# Patient Record
Sex: Male | Born: 1935 | Race: White | Hispanic: No | Marital: Married | Smoking: Never smoker
Health system: Southern US, Community
[De-identification: ages and names within clinical notes are randomized; demographics above are authoritative.]

## PROBLEM LIST (undated history)

## (undated) DIAGNOSIS — I2510 Atherosclerotic heart disease of native coronary artery without angina pectoris: Secondary | ICD-10-CM

## (undated) DIAGNOSIS — E119 Type 2 diabetes mellitus without complications: Secondary | ICD-10-CM

## (undated) DIAGNOSIS — I499 Cardiac arrhythmia, unspecified: Secondary | ICD-10-CM

## (undated) DIAGNOSIS — F0390 Unspecified dementia without behavioral disturbance: Secondary | ICD-10-CM

## (undated) DIAGNOSIS — K219 Gastro-esophageal reflux disease without esophagitis: Secondary | ICD-10-CM

## (undated) DIAGNOSIS — E785 Hyperlipidemia, unspecified: Secondary | ICD-10-CM

## (undated) DIAGNOSIS — C679 Malignant neoplasm of bladder, unspecified: Secondary | ICD-10-CM

## (undated) DIAGNOSIS — R310 Gross hematuria: Secondary | ICD-10-CM

## (undated) DIAGNOSIS — I10 Essential (primary) hypertension: Secondary | ICD-10-CM

## (undated) DIAGNOSIS — C259 Malignant neoplasm of pancreas, unspecified: Secondary | ICD-10-CM

## (undated) DIAGNOSIS — N189 Chronic kidney disease, unspecified: Secondary | ICD-10-CM

## (undated) HISTORY — PX: BLADDER SURGERY: SHX569

## (undated) HISTORY — DX: Malignant neoplasm of bladder, unspecified: C67.9

## (undated) HISTORY — DX: Malignant neoplasm of pancreas, unspecified: C25.9

## (undated) HISTORY — DX: Essential (primary) hypertension: I10

## (undated) HISTORY — PX: HERNIA REPAIR: SHX51

## (undated) HISTORY — DX: Gross hematuria: R31.0

## (undated) HISTORY — PX: WHIPPLE PROCEDURE: SHX2667

## (undated) HISTORY — DX: Type 2 diabetes mellitus without complications: E11.9

---

## 2013-03-09 HISTORY — PX: PORTACATH PLACEMENT: SHX2246

## 2013-06-21 ENCOUNTER — Ambulatory Visit: Payer: Self-pay | Admitting: Obstetrics and Gynecology

## 2013-07-07 DIAGNOSIS — D696 Thrombocytopenia, unspecified: Secondary | ICD-10-CM | POA: Insufficient documentation

## 2013-08-02 ENCOUNTER — Ambulatory Visit: Payer: Self-pay | Admitting: Urology

## 2013-08-02 DIAGNOSIS — I10 Essential (primary) hypertension: Secondary | ICD-10-CM

## 2013-08-02 DIAGNOSIS — E119 Type 2 diabetes mellitus without complications: Secondary | ICD-10-CM

## 2013-08-02 LAB — CBC WITH DIFFERENTIAL/PLATELET
Basophil #: 0.1 10*3/uL (ref 0.0–0.1)
Basophil %: 1.1 %
EOS ABS: 0.1 10*3/uL (ref 0.0–0.7)
EOS PCT: 1.2 %
HCT: 39.6 % — AB (ref 40.0–52.0)
HGB: 13 g/dL (ref 13.0–18.0)
Lymphocyte #: 1.2 10*3/uL (ref 1.0–3.6)
Lymphocyte %: 20 %
MCH: 30.9 pg (ref 26.0–34.0)
MCHC: 32.8 g/dL (ref 32.0–36.0)
MCV: 94 fL (ref 80–100)
Monocyte #: 0.6 x10 3/mm (ref 0.2–1.0)
Monocyte %: 9.7 %
Neutrophil #: 4.3 10*3/uL (ref 1.4–6.5)
Neutrophil %: 68 %
PLATELETS: 114 10*3/uL — AB (ref 150–440)
RBC: 4.21 10*6/uL — ABNORMAL LOW (ref 4.40–5.90)
RDW: 14.2 % (ref 11.5–14.5)
WBC: 6.2 10*3/uL (ref 3.8–10.6)

## 2013-08-02 LAB — BASIC METABOLIC PANEL
Anion Gap: 7 (ref 7–16)
BUN: 40 mg/dL — AB (ref 7–18)
CHLORIDE: 107 mmol/L (ref 98–107)
Calcium, Total: 9.3 mg/dL (ref 8.5–10.1)
Co2: 24 mmol/L (ref 21–32)
Creatinine: 1.47 mg/dL — ABNORMAL HIGH (ref 0.60–1.30)
EGFR (African American): 50 — ABNORMAL LOW
EGFR (Non-African Amer.): 43 — ABNORMAL LOW
Glucose: 175 mg/dL — ABNORMAL HIGH (ref 65–99)
Osmolality: 290 (ref 275–301)
Potassium: 4.1 mmol/L (ref 3.5–5.1)
Sodium: 138 mmol/L (ref 136–145)

## 2013-08-03 DIAGNOSIS — E785 Hyperlipidemia, unspecified: Secondary | ICD-10-CM | POA: Insufficient documentation

## 2013-08-03 DIAGNOSIS — R9431 Abnormal electrocardiogram [ECG] [EKG]: Secondary | ICD-10-CM | POA: Insufficient documentation

## 2013-08-03 DIAGNOSIS — I6529 Occlusion and stenosis of unspecified carotid artery: Secondary | ICD-10-CM | POA: Insufficient documentation

## 2013-08-03 DIAGNOSIS — I2510 Atherosclerotic heart disease of native coronary artery without angina pectoris: Secondary | ICD-10-CM | POA: Insufficient documentation

## 2013-08-03 DIAGNOSIS — M810 Age-related osteoporosis without current pathological fracture: Secondary | ICD-10-CM | POA: Insufficient documentation

## 2013-08-03 DIAGNOSIS — I779 Disorder of arteries and arterioles, unspecified: Secondary | ICD-10-CM | POA: Insufficient documentation

## 2013-08-03 DIAGNOSIS — R413 Other amnesia: Secondary | ICD-10-CM | POA: Insufficient documentation

## 2013-08-03 DIAGNOSIS — I739 Peripheral vascular disease, unspecified: Secondary | ICD-10-CM

## 2013-08-03 DIAGNOSIS — I493 Ventricular premature depolarization: Secondary | ICD-10-CM | POA: Insufficient documentation

## 2013-08-14 ENCOUNTER — Ambulatory Visit: Payer: Self-pay | Admitting: Urology

## 2013-08-15 LAB — PATHOLOGY REPORT

## 2013-08-16 ENCOUNTER — Emergency Department: Payer: Self-pay | Admitting: Emergency Medicine

## 2013-08-16 LAB — COMPREHENSIVE METABOLIC PANEL
ALBUMIN: 3.8 g/dL (ref 3.4–5.0)
AST: 24 U/L (ref 15–37)
Alkaline Phosphatase: 117 U/L
Anion Gap: 8 (ref 7–16)
BUN: 30 mg/dL — ABNORMAL HIGH (ref 7–18)
Bilirubin,Total: 0.8 mg/dL (ref 0.2–1.0)
CHLORIDE: 96 mmol/L — AB (ref 98–107)
Calcium, Total: 9 mg/dL (ref 8.5–10.1)
Co2: 23 mmol/L (ref 21–32)
Creatinine: 1.66 mg/dL — ABNORMAL HIGH (ref 0.60–1.30)
EGFR (Non-African Amer.): 37 — ABNORMAL LOW
GFR CALC AF AMER: 43 — AB
GLUCOSE: 230 mg/dL — AB (ref 65–99)
Osmolality: 269 (ref 275–301)
POTASSIUM: 3.9 mmol/L (ref 3.5–5.1)
SGPT (ALT): 24 U/L (ref 12–78)
Sodium: 127 mmol/L — ABNORMAL LOW (ref 136–145)
TOTAL PROTEIN: 6.8 g/dL (ref 6.4–8.2)

## 2013-08-16 LAB — CBC
HCT: 39.7 % — ABNORMAL LOW (ref 40.0–52.0)
HGB: 12.8 g/dL — ABNORMAL LOW (ref 13.0–18.0)
MCH: 30.2 pg (ref 26.0–34.0)
MCHC: 32.2 g/dL (ref 32.0–36.0)
MCV: 94 fL (ref 80–100)
Platelet: 132 10*3/uL — ABNORMAL LOW (ref 150–440)
RBC: 4.23 10*6/uL — ABNORMAL LOW (ref 4.40–5.90)
RDW: 14.3 % (ref 11.5–14.5)
WBC: 11.3 10*3/uL — ABNORMAL HIGH (ref 3.8–10.6)

## 2013-08-22 DIAGNOSIS — Z466 Encounter for fitting and adjustment of urinary device: Secondary | ICD-10-CM | POA: Insufficient documentation

## 2013-09-01 ENCOUNTER — Ambulatory Visit: Payer: Self-pay | Admitting: Hematology and Oncology

## 2013-09-06 ENCOUNTER — Ambulatory Visit: Payer: Self-pay | Admitting: Hematology and Oncology

## 2013-09-19 ENCOUNTER — Ambulatory Visit: Payer: Self-pay | Admitting: Hematology and Oncology

## 2013-09-19 LAB — COMPREHENSIVE METABOLIC PANEL
ALT: 32 U/L (ref 12–78)
ANION GAP: 10 (ref 7–16)
AST: 22 U/L (ref 15–37)
Albumin: 3.3 g/dL — ABNORMAL LOW (ref 3.4–5.0)
Alkaline Phosphatase: 116 U/L
BILIRUBIN TOTAL: 0.5 mg/dL (ref 0.2–1.0)
BUN: 32 mg/dL — ABNORMAL HIGH (ref 7–18)
CREATININE: 1.61 mg/dL — AB (ref 0.60–1.30)
Calcium, Total: 8.8 mg/dL (ref 8.5–10.1)
Chloride: 107 mmol/L (ref 98–107)
Co2: 25 mmol/L (ref 21–32)
EGFR (African American): 45 — ABNORMAL LOW
EGFR (Non-African Amer.): 38 — ABNORMAL LOW
Glucose: 290 mg/dL — ABNORMAL HIGH (ref 65–99)
Osmolality: 301 (ref 275–301)
Potassium: 4.7 mmol/L (ref 3.5–5.1)
SODIUM: 142 mmol/L (ref 136–145)
Total Protein: 6.3 g/dL — ABNORMAL LOW (ref 6.4–8.2)

## 2013-09-19 LAB — CBC CANCER CENTER
BASOS ABS: 0.1 x10 3/mm (ref 0.0–0.1)
Basophil %: 0.9 %
Eosinophil #: 0.1 x10 3/mm (ref 0.0–0.7)
Eosinophil %: 0.7 %
HCT: 40.1 % (ref 40.0–52.0)
HGB: 13 g/dL (ref 13.0–18.0)
LYMPHS ABS: 1.3 x10 3/mm (ref 1.0–3.6)
LYMPHS PCT: 16 %
MCH: 30.7 pg (ref 26.0–34.0)
MCHC: 32.3 g/dL (ref 32.0–36.0)
MCV: 95 fL (ref 80–100)
MONOS PCT: 7.7 %
Monocyte #: 0.6 x10 3/mm (ref 0.2–1.0)
Neutrophil #: 6.1 x10 3/mm (ref 1.4–6.5)
Neutrophil %: 74.7 %
PLATELETS: 118 x10 3/mm — AB (ref 150–440)
RBC: 4.21 10*6/uL — ABNORMAL LOW (ref 4.40–5.90)
RDW: 14.9 % — AB (ref 11.5–14.5)
WBC: 8.2 x10 3/mm (ref 3.8–10.6)

## 2013-09-27 ENCOUNTER — Ambulatory Visit: Payer: Self-pay | Admitting: Vascular Surgery

## 2013-10-03 LAB — CBC CANCER CENTER
BASOS ABS: 0.1 x10 3/mm (ref 0.0–0.1)
BASOS PCT: 0.9 %
EOS ABS: 0.1 x10 3/mm (ref 0.0–0.7)
Eosinophil %: 1.8 %
HCT: 39.7 % — ABNORMAL LOW (ref 40.0–52.0)
HGB: 13 g/dL (ref 13.0–18.0)
LYMPHS PCT: 20.4 %
Lymphocyte #: 1.4 x10 3/mm (ref 1.0–3.6)
MCH: 30.9 pg (ref 26.0–34.0)
MCHC: 32.8 g/dL (ref 32.0–36.0)
MCV: 94 fL (ref 80–100)
MONOS PCT: 10.9 %
Monocyte #: 0.7 x10 3/mm (ref 0.2–1.0)
NEUTROS ABS: 4.5 x10 3/mm (ref 1.4–6.5)
NEUTROS PCT: 66 %
Platelet: 134 x10 3/mm — ABNORMAL LOW (ref 150–440)
RBC: 4.22 10*6/uL — ABNORMAL LOW (ref 4.40–5.90)
RDW: 14.7 % — ABNORMAL HIGH (ref 11.5–14.5)
WBC: 6.9 x10 3/mm (ref 3.8–10.6)

## 2013-10-03 LAB — COMPREHENSIVE METABOLIC PANEL
Albumin: 3.5 g/dL (ref 3.4–5.0)
Alkaline Phosphatase: 113 U/L
Anion Gap: 7 (ref 7–16)
BUN: 31 mg/dL — ABNORMAL HIGH (ref 7–18)
Bilirubin,Total: 0.5 mg/dL (ref 0.2–1.0)
Calcium, Total: 9.1 mg/dL (ref 8.5–10.1)
Chloride: 105 mmol/L (ref 98–107)
Co2: 28 mmol/L (ref 21–32)
Creatinine: 1.67 mg/dL — ABNORMAL HIGH (ref 0.60–1.30)
EGFR (African American): 43 — ABNORMAL LOW
GFR CALC NON AF AMER: 37 — AB
Glucose: 183 mg/dL — ABNORMAL HIGH (ref 65–99)
Osmolality: 291 (ref 275–301)
POTASSIUM: 4.8 mmol/L (ref 3.5–5.1)
SGOT(AST): 27 U/L (ref 15–37)
SGPT (ALT): 31 U/L
SODIUM: 140 mmol/L (ref 136–145)
TOTAL PROTEIN: 6.5 g/dL (ref 6.4–8.2)

## 2013-10-07 ENCOUNTER — Ambulatory Visit: Payer: Self-pay | Admitting: Hematology and Oncology

## 2013-10-10 LAB — CBC CANCER CENTER
Basophil #: 0.1 x10 3/mm (ref 0.0–0.1)
Basophil %: 0.8 %
Eosinophil #: 0.1 x10 3/mm (ref 0.0–0.7)
Eosinophil %: 1.1 %
HCT: 39.3 % — AB (ref 40.0–52.0)
HGB: 12.7 g/dL — AB (ref 13.0–18.0)
LYMPHS PCT: 17.6 %
Lymphocyte #: 1.4 x10 3/mm (ref 1.0–3.6)
MCH: 30.7 pg (ref 26.0–34.0)
MCHC: 32.5 g/dL (ref 32.0–36.0)
MCV: 95 fL (ref 80–100)
MONO ABS: 0.7 x10 3/mm (ref 0.2–1.0)
Monocyte %: 8.3 %
Neutrophil #: 5.7 x10 3/mm (ref 1.4–6.5)
Neutrophil %: 72.2 %
Platelet: 128 x10 3/mm — ABNORMAL LOW (ref 150–440)
RBC: 4.16 10*6/uL — AB (ref 4.40–5.90)
RDW: 14.1 % (ref 11.5–14.5)
WBC: 7.9 x10 3/mm (ref 3.8–10.6)

## 2013-10-10 LAB — COMPREHENSIVE METABOLIC PANEL
ALBUMIN: 3.4 g/dL (ref 3.4–5.0)
ALT: 42 U/L
ANION GAP: 6 — AB (ref 7–16)
Alkaline Phosphatase: 98 U/L
BUN: 30 mg/dL — AB (ref 7–18)
Bilirubin,Total: 0.4 mg/dL (ref 0.2–1.0)
Calcium, Total: 8.5 mg/dL (ref 8.5–10.1)
Chloride: 109 mmol/L — ABNORMAL HIGH (ref 98–107)
Co2: 26 mmol/L (ref 21–32)
Creatinine: 1.63 mg/dL — ABNORMAL HIGH (ref 0.60–1.30)
GFR CALC AF AMER: 44 — AB
GFR CALC NON AF AMER: 38 — AB
Glucose: 165 mg/dL — ABNORMAL HIGH (ref 65–99)
OSMOLALITY: 291 (ref 275–301)
Potassium: 4.7 mmol/L (ref 3.5–5.1)
SGOT(AST): 33 U/L (ref 15–37)
SODIUM: 141 mmol/L (ref 136–145)
TOTAL PROTEIN: 6.4 g/dL (ref 6.4–8.2)

## 2013-10-17 LAB — COMPREHENSIVE METABOLIC PANEL
Albumin: 3.2 g/dL — ABNORMAL LOW (ref 3.4–5.0)
Alkaline Phosphatase: 111 U/L
Anion Gap: 8 (ref 7–16)
BUN: 32 mg/dL — ABNORMAL HIGH (ref 7–18)
Bilirubin,Total: 0.5 mg/dL (ref 0.2–1.0)
Calcium, Total: 8.4 mg/dL — ABNORMAL LOW (ref 8.5–10.1)
Chloride: 104 mmol/L (ref 98–107)
Co2: 26 mmol/L (ref 21–32)
Creatinine: 1.59 mg/dL — ABNORMAL HIGH (ref 0.60–1.30)
EGFR (African American): 45 — ABNORMAL LOW
EGFR (Non-African Amer.): 39 — ABNORMAL LOW
Glucose: 179 mg/dL — ABNORMAL HIGH (ref 65–99)
Osmolality: 287 (ref 275–301)
Potassium: 4.3 mmol/L (ref 3.5–5.1)
SGOT(AST): 20 U/L (ref 15–37)
SGPT (ALT): 31 U/L
Sodium: 138 mmol/L (ref 136–145)
Total Protein: 6 g/dL — ABNORMAL LOW (ref 6.4–8.2)

## 2013-10-17 LAB — CBC CANCER CENTER
Basophil #: 0 x10 3/mm (ref 0.0–0.1)
Basophil %: 0.6 %
Eosinophil #: 0.1 x10 3/mm (ref 0.0–0.7)
Eosinophil %: 0.9 %
HCT: 38.5 % — ABNORMAL LOW (ref 40.0–52.0)
HGB: 12.4 g/dL — ABNORMAL LOW (ref 13.0–18.0)
Lymphocyte #: 1 x10 3/mm (ref 1.0–3.6)
Lymphocyte %: 15.1 %
MCH: 30.4 pg (ref 26.0–34.0)
MCHC: 32.1 g/dL (ref 32.0–36.0)
MCV: 95 fL (ref 80–100)
Monocyte #: 0.7 x10 3/mm (ref 0.2–1.0)
Monocyte %: 9.7 %
Neutrophil #: 5.1 x10 3/mm (ref 1.4–6.5)
Neutrophil %: 73.7 %
Platelet: 103 x10 3/mm — ABNORMAL LOW (ref 150–440)
RBC: 4.06 10*6/uL — ABNORMAL LOW (ref 4.40–5.90)
RDW: 14.6 % — ABNORMAL HIGH (ref 11.5–14.5)
WBC: 6.9 x10 3/mm (ref 3.8–10.6)

## 2013-10-24 LAB — COMPREHENSIVE METABOLIC PANEL
ALBUMIN: 3 g/dL — AB (ref 3.4–5.0)
ALK PHOS: 126 U/L — AB
ALT: 33 U/L
ANION GAP: 8 (ref 7–16)
AST: 23 U/L (ref 15–37)
BUN: 19 mg/dL — ABNORMAL HIGH (ref 7–18)
Bilirubin,Total: 0.3 mg/dL (ref 0.2–1.0)
CHLORIDE: 104 mmol/L (ref 98–107)
CREATININE: 1.42 mg/dL — AB (ref 0.60–1.30)
Calcium, Total: 8.1 mg/dL — ABNORMAL LOW (ref 8.5–10.1)
Co2: 27 mmol/L (ref 21–32)
EGFR (African American): 52 — ABNORMAL LOW
GFR CALC NON AF AMER: 45 — AB
GLUCOSE: 212 mg/dL — AB (ref 65–99)
Osmolality: 286 (ref 275–301)
Potassium: 4.7 mmol/L (ref 3.5–5.1)
Sodium: 139 mmol/L (ref 136–145)
Total Protein: 5.6 g/dL — ABNORMAL LOW (ref 6.4–8.2)

## 2013-10-24 LAB — CBC CANCER CENTER
BASOS ABS: 0 x10 3/mm (ref 0.0–0.1)
BASOS PCT: 0.3 %
EOS ABS: 0.1 x10 3/mm (ref 0.0–0.7)
Eosinophil %: 1.4 %
HCT: 36.4 % — ABNORMAL LOW (ref 40.0–52.0)
HGB: 11.7 g/dL — ABNORMAL LOW (ref 13.0–18.0)
Lymphocyte #: 0.7 x10 3/mm — ABNORMAL LOW (ref 1.0–3.6)
Lymphocyte %: 13.5 %
MCH: 31 pg (ref 26.0–34.0)
MCHC: 32.2 g/dL (ref 32.0–36.0)
MCV: 96 fL (ref 80–100)
MONOS PCT: 7.7 %
Monocyte #: 0.4 x10 3/mm (ref 0.2–1.0)
Neutrophil #: 4.2 x10 3/mm (ref 1.4–6.5)
Neutrophil %: 77.1 %
PLATELETS: 102 x10 3/mm — AB (ref 150–440)
RBC: 3.78 10*6/uL — AB (ref 4.40–5.90)
RDW: 14.8 % — ABNORMAL HIGH (ref 11.5–14.5)
WBC: 5.4 x10 3/mm (ref 3.8–10.6)

## 2013-11-07 ENCOUNTER — Ambulatory Visit: Payer: Self-pay | Admitting: Hematology and Oncology

## 2013-11-07 LAB — COMPREHENSIVE METABOLIC PANEL
ANION GAP: 6 — AB (ref 7–16)
Albumin: 3.2 g/dL — ABNORMAL LOW (ref 3.4–5.0)
Alkaline Phosphatase: 127 U/L — ABNORMAL HIGH
BUN: 24 mg/dL — ABNORMAL HIGH (ref 7–18)
Bilirubin,Total: 0.4 mg/dL (ref 0.2–1.0)
CO2: 28 mmol/L (ref 21–32)
Calcium, Total: 8.3 mg/dL — ABNORMAL LOW (ref 8.5–10.1)
Chloride: 108 mmol/L — ABNORMAL HIGH (ref 98–107)
Creatinine: 1.4 mg/dL — ABNORMAL HIGH (ref 0.60–1.30)
EGFR (African American): 53 — ABNORMAL LOW
EGFR (Non-African Amer.): 45 — ABNORMAL LOW
Glucose: 158 mg/dL — ABNORMAL HIGH (ref 65–99)
OSMOLALITY: 290 (ref 275–301)
POTASSIUM: 4.3 mmol/L (ref 3.5–5.1)
SGOT(AST): 17 U/L (ref 15–37)
SGPT (ALT): 22 U/L
Sodium: 142 mmol/L (ref 136–145)
Total Protein: 5.7 g/dL — ABNORMAL LOW (ref 6.4–8.2)

## 2013-11-07 LAB — CBC CANCER CENTER
BASOS ABS: 0 x10 3/mm (ref 0.0–0.1)
BASOS PCT: 0.5 %
Eosinophil #: 0.8 x10 3/mm — ABNORMAL HIGH (ref 0.0–0.7)
Eosinophil %: 18.5 %
HCT: 35.6 % — ABNORMAL LOW (ref 40.0–52.0)
HGB: 11.6 g/dL — ABNORMAL LOW (ref 13.0–18.0)
Lymphocyte #: 0.5 x10 3/mm — ABNORMAL LOW (ref 1.0–3.6)
Lymphocyte %: 10.6 %
MCH: 31.3 pg (ref 26.0–34.0)
MCHC: 32.4 g/dL (ref 32.0–36.0)
MCV: 96 fL (ref 80–100)
MONO ABS: 0.5 x10 3/mm (ref 0.2–1.0)
MONOS PCT: 11 %
Neutrophil #: 2.7 x10 3/mm (ref 1.4–6.5)
Neutrophil %: 59.4 %
Platelet: 74 x10 3/mm — ABNORMAL LOW (ref 150–440)
RBC: 3.7 10*6/uL — ABNORMAL LOW (ref 4.40–5.90)
RDW: 15.2 % — AB (ref 11.5–14.5)
WBC: 4.6 x10 3/mm (ref 3.8–10.6)

## 2013-11-14 LAB — COMPREHENSIVE METABOLIC PANEL
ALK PHOS: 120 U/L — AB
ALT: 21 U/L
AST: 15 U/L (ref 15–37)
Albumin: 3 g/dL — ABNORMAL LOW (ref 3.4–5.0)
Anion Gap: 7 (ref 7–16)
BUN: 27 mg/dL — ABNORMAL HIGH (ref 7–18)
Bilirubin,Total: 0.3 mg/dL (ref 0.2–1.0)
Calcium, Total: 8 mg/dL — ABNORMAL LOW (ref 8.5–10.1)
Chloride: 106 mmol/L (ref 98–107)
Co2: 25 mmol/L (ref 21–32)
Creatinine: 1.45 mg/dL — ABNORMAL HIGH (ref 0.60–1.30)
EGFR (Non-African Amer.): 44 — ABNORMAL LOW
GFR CALC AF AMER: 51 — AB
Glucose: 190 mg/dL — ABNORMAL HIGH (ref 65–99)
OSMOLALITY: 286 (ref 275–301)
Potassium: 4.5 mmol/L (ref 3.5–5.1)
SODIUM: 138 mmol/L (ref 136–145)
Total Protein: 5.6 g/dL — ABNORMAL LOW (ref 6.4–8.2)

## 2013-11-14 LAB — CBC CANCER CENTER
BASOS ABS: 0 x10 3/mm (ref 0.0–0.1)
Basophil %: 0.8 %
EOS ABS: 0.4 x10 3/mm (ref 0.0–0.7)
Eosinophil %: 11.3 %
HCT: 35.7 % — AB (ref 40.0–52.0)
HGB: 11.5 g/dL — ABNORMAL LOW (ref 13.0–18.0)
LYMPHS ABS: 0.4 x10 3/mm — AB (ref 1.0–3.6)
LYMPHS PCT: 9.5 %
MCH: 31.1 pg (ref 26.0–34.0)
MCHC: 32.1 g/dL (ref 32.0–36.0)
MCV: 97 fL (ref 80–100)
MONOS PCT: 15.5 %
Monocyte #: 0.6 x10 3/mm (ref 0.2–1.0)
NEUTROS ABS: 2.5 x10 3/mm (ref 1.4–6.5)
Neutrophil %: 62.9 %
Platelet: 95 x10 3/mm — ABNORMAL LOW (ref 150–440)
RBC: 3.69 10*6/uL — AB (ref 4.40–5.90)
RDW: 15.7 % — AB (ref 11.5–14.5)
WBC: 3.9 x10 3/mm (ref 3.8–10.6)

## 2013-11-21 LAB — COMPREHENSIVE METABOLIC PANEL
ANION GAP: 6 — AB (ref 7–16)
Albumin: 3.2 g/dL — ABNORMAL LOW (ref 3.4–5.0)
Alkaline Phosphatase: 120 U/L — ABNORMAL HIGH
BILIRUBIN TOTAL: 0.3 mg/dL (ref 0.2–1.0)
BUN: 24 mg/dL — ABNORMAL HIGH (ref 7–18)
CALCIUM: 8.5 mg/dL (ref 8.5–10.1)
CO2: 26 mmol/L (ref 21–32)
Chloride: 106 mmol/L (ref 98–107)
Creatinine: 1.28 mg/dL (ref 0.60–1.30)
EGFR (African American): 58 — ABNORMAL LOW
EGFR (Non-African Amer.): 50 — ABNORMAL LOW
Glucose: 121 mg/dL — ABNORMAL HIGH (ref 65–99)
Osmolality: 281 (ref 275–301)
Potassium: 4.2 mmol/L (ref 3.5–5.1)
SGOT(AST): 21 U/L (ref 15–37)
SGPT (ALT): 26 U/L
Sodium: 138 mmol/L (ref 136–145)
Total Protein: 5.9 g/dL — ABNORMAL LOW (ref 6.4–8.2)

## 2013-11-21 LAB — CBC CANCER CENTER
BASOS PCT: 0.4 %
Basophil #: 0 x10 3/mm (ref 0.0–0.1)
EOS ABS: 0.3 x10 3/mm (ref 0.0–0.7)
EOS PCT: 4.4 %
HCT: 36.4 % — AB (ref 40.0–52.0)
HGB: 11.7 g/dL — AB (ref 13.0–18.0)
LYMPHS PCT: 5.9 %
Lymphocyte #: 0.3 x10 3/mm — ABNORMAL LOW (ref 1.0–3.6)
MCH: 31.3 pg (ref 26.0–34.0)
MCHC: 32.3 g/dL (ref 32.0–36.0)
MCV: 97 fL (ref 80–100)
MONO ABS: 0.8 x10 3/mm (ref 0.2–1.0)
Monocyte %: 13.7 %
NEUTROS PCT: 75.6 %
Neutrophil #: 4.3 x10 3/mm (ref 1.4–6.5)
Platelet: 102 x10 3/mm — ABNORMAL LOW (ref 150–440)
RBC: 3.74 10*6/uL — ABNORMAL LOW (ref 4.40–5.90)
RDW: 16.2 % — ABNORMAL HIGH (ref 11.5–14.5)
WBC: 5.7 x10 3/mm (ref 3.8–10.6)

## 2013-12-07 ENCOUNTER — Ambulatory Visit: Payer: Self-pay | Admitting: Hematology and Oncology

## 2013-12-14 LAB — CBC CANCER CENTER
Basophil #: 0 x10 3/mm (ref 0.0–0.1)
Basophil %: 0.4 %
Eosinophil #: 0 x10 3/mm (ref 0.0–0.7)
Eosinophil %: 1.4 %
HCT: 34.4 % — ABNORMAL LOW (ref 40.0–52.0)
HGB: 11.1 g/dL — ABNORMAL LOW (ref 13.0–18.0)
LYMPHS PCT: 9.5 %
Lymphocyte #: 0.3 x10 3/mm — ABNORMAL LOW (ref 1.0–3.6)
MCH: 32.4 pg (ref 26.0–34.0)
MCHC: 32.3 g/dL (ref 32.0–36.0)
MCV: 100 fL (ref 80–100)
Monocyte #: 0.5 x10 3/mm (ref 0.2–1.0)
Monocyte %: 14.1 %
NEUTROS PCT: 74.6 %
Neutrophil #: 2.6 x10 3/mm (ref 1.4–6.5)
Platelet: 65 x10 3/mm — ABNORMAL LOW (ref 150–440)
RBC: 3.43 10*6/uL — ABNORMAL LOW (ref 4.40–5.90)
RDW: 20 % — AB (ref 11.5–14.5)
WBC: 3.5 x10 3/mm — AB (ref 3.8–10.6)

## 2013-12-14 LAB — COMPREHENSIVE METABOLIC PANEL
ALBUMIN: 3.2 g/dL — AB (ref 3.4–5.0)
ALK PHOS: 111 U/L
Anion Gap: 10 (ref 7–16)
BUN: 22 mg/dL — AB (ref 7–18)
Bilirubin,Total: 0.5 mg/dL (ref 0.2–1.0)
Calcium, Total: 8.6 mg/dL (ref 8.5–10.1)
Chloride: 105 mmol/L (ref 98–107)
Co2: 25 mmol/L (ref 21–32)
Creatinine: 1.56 mg/dL — ABNORMAL HIGH (ref 0.60–1.30)
EGFR (African American): 55 — ABNORMAL LOW
EGFR (Non-African Amer.): 45 — ABNORMAL LOW
GLUCOSE: 221 mg/dL — AB (ref 65–99)
Osmolality: 290 (ref 275–301)
POTASSIUM: 4.8 mmol/L (ref 3.5–5.1)
SGOT(AST): 21 U/L (ref 15–37)
SGPT (ALT): 29 U/L
SODIUM: 140 mmol/L (ref 136–145)
Total Protein: 5.7 g/dL — ABNORMAL LOW (ref 6.4–8.2)

## 2014-01-07 ENCOUNTER — Ambulatory Visit: Payer: Self-pay | Admitting: Hematology and Oncology

## 2014-01-25 LAB — COMPREHENSIVE METABOLIC PANEL
Albumin: 3.3 g/dL — ABNORMAL LOW (ref 3.4–5.0)
Alkaline Phosphatase: 108 U/L
Anion Gap: 6 — ABNORMAL LOW (ref 7–16)
BUN: 37 mg/dL — ABNORMAL HIGH (ref 7–18)
Bilirubin,Total: 0.5 mg/dL (ref 0.2–1.0)
Calcium, Total: 8.8 mg/dL (ref 8.5–10.1)
Chloride: 109 mmol/L — ABNORMAL HIGH (ref 98–107)
Co2: 26 mmol/L (ref 21–32)
Creatinine: 1.75 mg/dL — ABNORMAL HIGH (ref 0.60–1.30)
EGFR (African American): 48 — ABNORMAL LOW
EGFR (Non-African Amer.): 39 — ABNORMAL LOW
Glucose: 170 mg/dL — ABNORMAL HIGH (ref 65–99)
Osmolality: 294 (ref 275–301)
Potassium: 4.7 mmol/L (ref 3.5–5.1)
SGOT(AST): 21 U/L (ref 15–37)
SGPT (ALT): 25 U/L
Sodium: 141 mmol/L (ref 136–145)
Total Protein: 5.9 g/dL — ABNORMAL LOW (ref 6.4–8.2)

## 2014-01-25 LAB — CBC CANCER CENTER
Basophil #: 0 x10 3/mm (ref 0.0–0.1)
Basophil %: 0.7 %
Eosinophil #: 0.1 x10 3/mm (ref 0.0–0.7)
Eosinophil %: 1.4 %
HCT: 35.1 % — ABNORMAL LOW (ref 40.0–52.0)
HGB: 11.1 g/dL — ABNORMAL LOW (ref 13.0–18.0)
Lymphocyte #: 0.5 x10 3/mm — ABNORMAL LOW (ref 1.0–3.6)
Lymphocyte %: 9.6 %
MCH: 33.1 pg (ref 26.0–34.0)
MCHC: 31.6 g/dL — ABNORMAL LOW (ref 32.0–36.0)
MCV: 105 fL — ABNORMAL HIGH (ref 80–100)
Monocyte #: 0.6 x10 3/mm (ref 0.2–1.0)
Monocyte %: 11.7 %
Neutrophil #: 3.9 x10 3/mm (ref 1.4–6.5)
Neutrophil %: 76.6 %
Platelet: 103 x10 3/mm — ABNORMAL LOW (ref 150–440)
RBC: 3.35 10*6/uL — ABNORMAL LOW (ref 4.40–5.90)
RDW: 17.5 % — ABNORMAL HIGH (ref 11.5–14.5)
WBC: 5.1 x10 3/mm (ref 3.8–10.6)

## 2014-02-06 ENCOUNTER — Ambulatory Visit: Payer: Self-pay | Admitting: Hematology and Oncology

## 2014-03-09 ENCOUNTER — Ambulatory Visit: Payer: Self-pay | Admitting: Hematology and Oncology

## 2014-04-27 ENCOUNTER — Ambulatory Visit: Payer: Self-pay | Admitting: Hematology and Oncology

## 2014-05-08 ENCOUNTER — Ambulatory Visit
Admit: 2014-05-08 | Disposition: A | Discharge status: Home / Self Care | Payer: Self-pay | Attending: Hematology and Oncology | Admitting: Hematology and Oncology

## 2014-06-08 ENCOUNTER — Ambulatory Visit
Admit: 2014-06-08 | Disposition: A | Discharge status: Home / Self Care | Payer: Self-pay | Attending: Hematology and Oncology | Admitting: Hematology and Oncology

## 2014-06-09 DIAGNOSIS — E119 Type 2 diabetes mellitus without complications: Secondary | ICD-10-CM | POA: Insufficient documentation

## 2014-06-09 DIAGNOSIS — N183 Chronic kidney disease, stage 3 unspecified: Secondary | ICD-10-CM | POA: Insufficient documentation

## 2014-06-09 DIAGNOSIS — N3289 Other specified disorders of bladder: Secondary | ICD-10-CM | POA: Insufficient documentation

## 2014-06-09 DIAGNOSIS — R310 Gross hematuria: Secondary | ICD-10-CM | POA: Insufficient documentation

## 2014-06-09 DIAGNOSIS — Z794 Long term (current) use of insulin: Secondary | ICD-10-CM

## 2014-06-09 DIAGNOSIS — Z09 Encounter for follow-up examination after completed treatment for conditions other than malignant neoplasm: Secondary | ICD-10-CM | POA: Insufficient documentation

## 2014-06-09 DIAGNOSIS — C679 Malignant neoplasm of bladder, unspecified: Secondary | ICD-10-CM | POA: Insufficient documentation

## 2014-06-09 DIAGNOSIS — N329 Bladder disorder, unspecified: Secondary | ICD-10-CM | POA: Insufficient documentation

## 2014-06-09 DIAGNOSIS — R319 Hematuria, unspecified: Secondary | ICD-10-CM | POA: Insufficient documentation

## 2014-06-09 DIAGNOSIS — E1122 Type 2 diabetes mellitus with diabetic chronic kidney disease: Secondary | ICD-10-CM | POA: Insufficient documentation

## 2014-06-09 DIAGNOSIS — I10 Essential (primary) hypertension: Secondary | ICD-10-CM | POA: Insufficient documentation

## 2014-06-09 DIAGNOSIS — K921 Melena: Secondary | ICD-10-CM | POA: Insufficient documentation

## 2014-06-09 DIAGNOSIS — C259 Malignant neoplasm of pancreas, unspecified: Secondary | ICD-10-CM | POA: Insufficient documentation

## 2014-06-26 LAB — CREATININE, SERUM: CREATINE, SERUM: 1.61

## 2014-06-30 NOTE — Consult Note (Signed)
Reason for Visit: This 81 year old Male patient presents to the clinic for initial evaluation of  bladder cancer .   Referred by Dr.Ramiah.  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 For chemoradiation based on advanced age and overall comorbidities  Pathology Report pathology report reviewed   Imaging Report CT scan reviewed   Referral Report clinical notes reviewed   Planned Treatment Regimen concurrent chemoradiation   HPI   patient is in 81 year old male who presented with hematuria and underwent a TURBT on 08/14/2013 showing tumor in the base of the bladder biopsy-positive for high-grade muscle invasive carcinoma.tumor invades the muscularis propria and there was lymph vascular invasion.patient has a history of pancreatic cancer several years prior and has completed a Whipple procedure. He still has some occasional hematuria. Bowels are functioning well. Having no abdominal pain or discomfort. He has been seen by medical oncology based on his advanced age and overall general condition recommendation for chemoradiation was made. He is seen today for radiation oncology opinion. He has very little frequency urgency or nocturia.  Past Hx:    Pancreatic Cancer: s/p whipple   Hypertension:    Diabetes:    Urothelial Cancer:    Hernia Repair:    Whipple Procedure:   Past, Family and Social History:  Past Medical History positive   Cardiovascular hypertension   Cancer pancreatic cancer   Endocrine diabetes mellitus   Past Surgical History status post Whipple procedure, herniorrhaphy repair   Family History positive   Family History Comments Sr. with stomach cancer   Social History noncontributory   Additional Past Medical and Surgical History accompanied by his wife today   Allergies:   Penicillin: Unknown  Home Meds:  Home Medications: Medication Instructions  Status  Januvia 50 mg oral tablet 1 tab(s) orally once a day at noon Active  amLODIPine 10 mg oral tablet 1 tab(s) orally once a day at noon. Active  valsartan 320 mg oral tablet 1 tab(s) orally once a day at noon. Active  Lantus Solostar Pen 100 units/mL subcutaneous solution 20 unit(s) subcutaneous once a day after breakfast. Active  Namenda 10 mg oral tablet 1 tab(s) orally once a day Active  simvastatin 20 mg oral tablet 1 tab(s) orally once a day at noon Active  ferrous sulfate 325 mg oral tablet 1 tab(s) orally 3 times a day Active  omeprazole 20 mg oral delayed release capsule 1 cap(s) orally once a day Active  aspirin 81 mg oral tablet 1 tab(s) orally once a day EC Active   Review of Systems:  General negative   Performance Status (ECOG) 0   Skin negative   Breast negative   Ophthalmologic negative   ENMT negative   Respiratory and Thorax negative   Cardiovascular negative   Gastrointestinal negative   Genitourinary see HPI   Musculoskeletal negative   Neurological negative   Psychiatric negative   Hematology/Lymphatics negative   Endocrine negative   Allergic/Immunologic negative   Review of Systems   denies any weight loss, fatigue, weakness, fever, chills or night sweats. Patient denies any loss of vision, blurred vision. Patient denies any ringing  of the ears or hearing loss. No irregular heartbeat. Patient denies heart murmur or history of fainting. Patient denies any chest pain or pain radiating to her upper extremities. Patient denies any shortness of breath, difficulty breathing at night, cough or hemoptysis. Patient denies any swelling  in the lower legs. Patient denies any nausea vomiting, vomiting of blood, or coffee ground material in the vomitus. Patient denies any stomach pain. Patient states has had normal bowel movements no significant constipation or diarrhea. Patient denies any dysuria, hematuria or significant nocturia. Patient denies any  problems walking, swelling in the joints or loss of balance. Patient denies any skin changes, loss of hair or loss of weight. Patient denies any excessive worrying or anxiety or significant depression. Patient denies any problems with insomnia. Patient denies excessive thirst, polyuria, polydipsia. Patient denies any swollen glands, patient denies easy bruising or easy bleeding. Patient denies any recent infections, allergies or URI. Patient "s visual fields have not changed significantly in recent time.   Nursing Notes:  Nursing Vital Signs and Chemo Nursing Nursing Notes: *CC Vital Signs Flowsheet:   16-Jul-15 09:31  Temp Temperature 93.6  Pulse Pulse 60  Respirations Respirations 20  SBP SBP 127  DBP DBP 64  Pain Scale (0-10)  0  Current Weight (kg) (kg) 65.6  Height (cm) centimeters 167.5  BSA (m2) 1.7   Physical Exam:  General/Skin/HEENT:  General normal   Skin normal   Eyes normal   ENMT normal   Head and Neck normal   Additional PE well-developed elderly gentleman in NAD. Lungs are clear to A&P cardiac examination shows regular rate and rhythm. Abdomen is benign. No suprapubic tenderness is noted. No edema in his lower extremities is noted. Positive bowel sounds all 4 quadrants of his abdomen.   Breasts/Resp/CV/GI/GU:  Respiratory and Thorax normal   Cardiovascular normal   Gastrointestinal normal   Genitourinary normal   MS/Neuro/Psych/Lymph:  Musculoskeletal normal   Neurological normal   Lymphatics normal   Other Results:  Radiology Results: LabUnknown:    15-Apr-15 11:06, CT Abdomen and Pelvis W/WO Contrast  PACS Image   CT:  CT Abdomen and Pelvis W/WO Contrast   REASON FOR EXAM:    LABS FIRST Hematuria Diabetic Not Metformin  COMMENTS:       PROCEDURE: KCT - KCT ABDOMEN/PELVIS W/WO  - Jun 21 2013 11:06AM     CLINICAL DATA:  Painless gross hematuria.    EXAM:  CT ABDOMEN AND PELVIS WITHOUT AND WITH CONTRAST    TECHNIQUE:  Multidetector CT  imaging of the abdomen and pelvis was performed  without contrast material in one or both body regions, followed by  contrast material(s) and further sections in one or both body  regions.  CONTRAST:  100 cc Isovue 300 IV    COMPARISON:  None.    FINDINGS:  Lung bases are clear.  No effusions.  Heart is normal size.    Precontrast images demonstrate no renal or ureteral stones. No  hydronephrosis.    Post-contrast images demonstrate mild atrophy and cortical thinning  within the kidneys bilaterally. Small hypodense area in the upper  pole of the right kidney on the delayed images measuring 9 mm,  likely small cyst. No suspicious renal lesions.  Delayed images through the abdomen and pelvis demonstrate normal  appearance of the ureters without focal abnormality. There is a  polypoid filling defect with in the posterior bladder near the right  ureterovesical junction, measuring 1.5 cm on image 64 of series 12.  Underlying focal wall thickening. This is concerning for polypoid  bladder cancer. Diffuse bladder wall thickening noted with numerous  diverticula. Prostate is mildly prominent.    Nodular contours of the liver compatible with cirrhosis. Portal vein  is patent. No focal hepatic  abnormality.    Priorcholecystectomy. Surgical changes in the distal stomach and in  the porta hepatis and region of the pancreatic head of unknown type.  Low-density area noted in the region of the pancreatic head  measuring up to 2.2 cm. No pancreatic or biliary ductaldilatation.  This low-density area could be related to postsurgical change.  Recommend clinical correlation for type of prior right upper  quadrant surgery. Has the patient had prior Whipple procedure?    Appendix is visualized and is normal. Large and small bowel grossly  unremarkable. No free fluid or free air. Aorta is heavily calcified,  non aneurysmal. No adenopathy. Right inguinal hernia containing fat.  Umbilical hernia  containing fat.    Severe compression fracture involving the L3 vertebral body, likely  chronic. Degenerative changes throughout the lumbar spine.     IMPRESSION:  Posterior bladder wall thickening with polypoid mass protruding into  the bladder lumen measuring 1.5 cm near the right ureterovesical  junction concerning for bladder cancer. Recommend direct  visualization with cystoscopy.    Diffuse bladder wall thickening with several diverticula, likely  related to mildly prominent prostate.    Umbilical and right inguinal hernias containing fat.    Postsurgical changes in the right upper quadrant and stomach. There  is a low-density area noted in the region of what is expected to be  the pancreatic head, but the pancreatic head is not well visualized.  Has the patient had prior Whipple procedure? Recommend clinical  correlation. Recommend attention to this area on followup imaging.    Nodular contours of the liver compatible with cirrhosis.  Electronically Signed    By: Rolm Baptise M.D.    On: 06/21/2013 13:53         Verified By: Raelyn Number, M.D.,   Relevent Results:   Relevant Scans and Labs CT scan of the abdomen is reviewed   Assessment and Plan: Impression:   at least stage II muscle invading bladder cancer in 81 year old male high-grade lesion for chemoradiation with curative intent Plan:   at this time based on the patient's advanced age and medical comorbidities would go ahead with chemoradiation. I will plan on delivering 4500 cGy to his pelvis along with chemotherapy. I would induced his bladder another 2000 cGy to the bladder base which is the area of involvement by CT criteria. Risks and benefits of treatment including increased urinary frequency urgency and possible nocturia as well as possibility of diarrhea, alteration of blood counts, and fatigue all were discussed in detail. He seems to comprehend my treatment plan well. I have set him up for CT  simulation. He is having a Port-A-Cath placed sometime next week. We'll coordinate his chemotherapy with medical oncology. He will probably be on mitomycin-C, 5-FU since his renal function would not allow platinum based chemotherapy.  I would like to take this opportunity for allowing me to participate in the care of your patient..  CC Referral:  cc: Dr. Frazier Richards   Electronic Signatures: Baruch Gouty, Roda Shutters (MD)  (Signed 16-Jul-15 12:04)  Authored: HPI, Diagnosis, Past Hx, PFSH, Allergies, Home Meds, ROS, Nursing Notes, Physical Exam, Other Results, Relevent Results, Encounter Assessment and Plan, CC Referring Physician   Last Updated: 16-Jul-15 12:04 by Armstead Peaks (MD)

## 2014-06-30 NOTE — Op Note (Signed)
PATIENT NAME:  Colton Tyler, Colton Tyler MR#:  [id number] DATE OF BIRTH:  05-Oct-1925  DATE OF PROCEDURE:  08/14/2013  PREOPERATIVE DIAGNOSIS: Bladder tumor.   POSTOPERATIVE DIAGNOSIS: Bladder tumor.  PROCEDURE : Transurethral resection of bladder tumor - large.   ANESTHESIA: General.   ESTIMATED BLOOD LOSS: Less than 5 mL.   DESCRIPTION OF PROCEDURE: After an appropriate timeout, agreed to by all parties, entered the bladder with a visualizing saline resectoscope bipolar. Normal saline irrigation was utilized. The bladder was entered. The tumor was seen at the bladder. It is all along the bladder neck, but it is not deep into the floor of the bladder. Ureters are never seen in the case. So I resected the bladder tumor with normal saline resectoscope. Bleeding is controlled easily with a bipolar control, as the piece is sent to pathology after being irrigated out with suction irrigation. There is minimal bleeding at the end of procedure . A 24 French Foley was placed in the bladder. This is a two-way Foley, 20 mL in the balloon. Irrigation is clear. It is put to gravity irrigation not to constant irrigation. A  B and O suppository and bimanual exam show no fixation of the bladder. B and O suppository is placed in the end of the procedure in the rectum. Into the Foley catheter I put 30 mL of 0.5% plain Marcaine. The prostate itself was not enlarged. There is no strictures along the urethra. It is easy to get into his bladder and take care of the tumor. Some of the prostate is resected with the tumor at the bladder neck. He is sent to recovery in satisfactory condition.   ____________________________ Janice Coffin. Elnoria Howard, DO rdh:sg D: 08/14/2013 10:11:51 ET T: 08/14/2013 10:16:44 ET JOB#: [id number]  cc: Janice Coffin. Elnoria Howard, DO, <Dictator>  Almando Brawley D Porcia Morganti DO ELECTRONICALLY SIGNED 08/18/2013 15:42

## 2014-06-30 NOTE — Op Note (Signed)
PATIENT NAME:  Colton Tyler, Colton Tyler MR#:  [id number] DATE OF BIRTH:  09-30-1925  DATE OF PROCEDURE:  09/27/2013  PREOPERATIVE DIAGNOSIS: Urethral carcinoma.   POSTOPERATIVE DIAGNOSIS:  Urethral carcinoma.  PROCEDURE PERFORMED: Insertion of a right internal jugular Infuse-a-Port with fluoroscopic and ultrasound guidance.   SURGEON: Hortencia Pilar, M.D.   CONTRAST USED: None.   FLUOROSCOPY TIME: 0.6 minutes.   INDICATIONS: 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 He, therefore, requires adequate IV access. The risks and benefits for Infuse-a-Port are discussed. All questions answered. The patient has agreed to proceed.   DESCRIPTION OF PROCEDURE: The patient is taken to the special procedure suite, placed in the supine position. After adequate sedation is achieved, his right neck and chest wall are prepped and draped in sterile fashion. Appropriate timeout is called.   Ultrasound is placed in a sterile sleeve. Jugular vein is identified. It is echolucent and easily compressible, indicating patency. Image is recorded for the permanent record, and after 1% lidocaine has been infiltrated in his chest wall and based of the neck, Seldinger needle is inserted without difficulty. A J-wire is then advanced under fluoroscopic guidance down to the level of the atrium.   A small counterincision is made at the wire insertion site, and a pocket fashioned with blunt dissection. A transverse incision is made two fingerbreadths below the clavicle and a pocket is fashioned using blunt and sharp dissection. It is tested for appropriate size with the port and, subsequently once verified, the intravascular catheter portion is passed subcutaneously from the pocket incision to the neck counterincision. Dilator and peel-away sheath are inserted.  Dilator and wire were removed and the port is inserted into the vascular space.   The tip is then adjusted under fluoroscopic  guidance, trimmed, and connected to the hub.  It is slipped into the pocket. After review of this image, did appear that the catheter is slightly long and therefore the hub is removed from the pocket. The catheter is disconnected and approximately 2 more centimeters of catheter were removed. The hub is then attached to the catheter without difficulty and the port is slipped into the subcutaneous pocket. It is accessed with a Charisse March needle percutaneously. It aspirates easily and flushes well, and on fluoroscopy, the tip is just at the atriocaval junction. The pocket incision is then closed in layers using 3-0 Vicryl, followed by 4-0 Monocryl subcuticular and Dermabond. The neck counterincision is closed with 4-0 Monocryl subcuticular and Dermabond. The patient tolerated the procedure well. There were no immediate complications. Sponge and needle counts are correct, and he was taken to the recovery area in excellent condition.    ____________________________ Katha Cabal, MD ggs:ts D: 09/27/2013 17:28:08 ET T: 09/27/2013 19:22:15 ET JOB#: [id number]  cc: Katha Cabal, MD, <Dictator> Katha Cabal MD ELECTRONICALLY SIGNED 10/03/2013 12:24

## 2014-07-20 ENCOUNTER — Other Ambulatory Visit: Payer: Self-pay

## 2014-07-20 DIAGNOSIS — C259 Malignant neoplasm of pancreas, unspecified: Secondary | ICD-10-CM

## 2014-07-23 ENCOUNTER — Encounter: Payer: Self-pay | Admitting: Urology

## 2014-07-23 DIAGNOSIS — R310 Gross hematuria: Secondary | ICD-10-CM

## 2014-07-23 DIAGNOSIS — Z466 Encounter for fitting and adjustment of urinary device: Secondary | ICD-10-CM

## 2014-07-23 HISTORY — DX: Gross hematuria: R31.0

## 2014-07-27 ENCOUNTER — Inpatient Hospital Stay: Payer: Medicare Other

## 2014-07-27 ENCOUNTER — Inpatient Hospital Stay: Payer: Medicare Other | Attending: Hematology and Oncology | Admitting: Hematology and Oncology

## 2014-07-27 ENCOUNTER — Telehealth: Payer: Self-pay | Admitting: *Deleted

## 2014-07-27 VITALS — BP 147/76 | HR 58 | Temp 96.8°F | Wt 144.4 lb

## 2014-07-27 DIAGNOSIS — I129 Hypertensive chronic kidney disease with stage 1 through stage 4 chronic kidney disease, or unspecified chronic kidney disease: Secondary | ICD-10-CM | POA: Insufficient documentation

## 2014-07-27 DIAGNOSIS — E119 Type 2 diabetes mellitus without complications: Secondary | ICD-10-CM | POA: Insufficient documentation

## 2014-07-27 DIAGNOSIS — D494 Neoplasm of unspecified behavior of bladder: Secondary | ICD-10-CM

## 2014-07-27 DIAGNOSIS — Z800 Family history of malignant neoplasm of digestive organs: Secondary | ICD-10-CM | POA: Insufficient documentation

## 2014-07-27 DIAGNOSIS — Z794 Long term (current) use of insulin: Secondary | ICD-10-CM | POA: Diagnosis not present

## 2014-07-27 DIAGNOSIS — N189 Chronic kidney disease, unspecified: Secondary | ICD-10-CM | POA: Insufficient documentation

## 2014-07-27 DIAGNOSIS — Z7982 Long term (current) use of aspirin: Secondary | ICD-10-CM | POA: Insufficient documentation

## 2014-07-27 DIAGNOSIS — C259 Malignant neoplasm of pancreas, unspecified: Secondary | ICD-10-CM

## 2014-07-27 DIAGNOSIS — Z8507 Personal history of malignant neoplasm of pancreas: Secondary | ICD-10-CM | POA: Insufficient documentation

## 2014-07-27 DIAGNOSIS — C679 Malignant neoplasm of bladder, unspecified: Secondary | ICD-10-CM | POA: Diagnosis present

## 2014-07-27 DIAGNOSIS — N3289 Other specified disorders of bladder: Secondary | ICD-10-CM

## 2014-07-27 DIAGNOSIS — Z79899 Other long term (current) drug therapy: Secondary | ICD-10-CM | POA: Insufficient documentation

## 2014-07-27 LAB — CBC WITH DIFFERENTIAL/PLATELET
Basophils Absolute: 0 10*3/uL (ref 0–0.1)
Basophils Relative: 0 %
Eosinophils Absolute: 0.1 10*3/uL (ref 0–0.7)
Eosinophils Relative: 1 %
HCT: 36 % — ABNORMAL LOW (ref 40.0–52.0)
Hemoglobin: 11.7 g/dL — ABNORMAL LOW (ref 13.0–18.0)
Lymphocytes Relative: 11 %
Lymphs Abs: 0.4 10*3/uL — ABNORMAL LOW (ref 1.0–3.6)
MCH: 31.1 pg (ref 26.0–34.0)
MCHC: 32.5 g/dL (ref 32.0–36.0)
MCV: 95.7 fL (ref 80.0–100.0)
Monocytes Absolute: 0.4 10*3/uL (ref 0.2–1.0)
Monocytes Relative: 11 %
Neutro Abs: 2.9 10*3/uL (ref 1.4–6.5)
Neutrophils Relative %: 77 %
Platelets: 107 10*3/uL — ABNORMAL LOW (ref 150–440)
RBC: 3.76 MIL/uL — ABNORMAL LOW (ref 4.40–5.90)
RDW: 14.9 % — ABNORMAL HIGH (ref 11.5–14.5)
WBC: 3.8 10*3/uL (ref 3.8–10.6)

## 2014-07-27 LAB — COMPREHENSIVE METABOLIC PANEL
ALT: 15 U/L — ABNORMAL LOW (ref 17–63)
AST: 22 U/L (ref 15–41)
Albumin: 3.9 g/dL (ref 3.5–5.0)
Alkaline Phosphatase: 106 U/L (ref 38–126)
Anion gap: 6 (ref 5–15)
BUN: 38 mg/dL — ABNORMAL HIGH (ref 6–20)
CO2: 24 mmol/L (ref 22–32)
Calcium: 8.8 mg/dL — ABNORMAL LOW (ref 8.9–10.3)
Chloride: 106 mmol/L (ref 101–111)
Creatinine, Ser: 1.78 mg/dL — ABNORMAL HIGH (ref 0.61–1.24)
GFR calc Af Amer: 38 mL/min — ABNORMAL LOW (ref >60)
GFR calc non Af Amer: 33 mL/min — ABNORMAL LOW (ref >60)
Glucose, Bld: 255 mg/dL — ABNORMAL HIGH (ref 65–99)
Potassium: 4.4 mmol/L (ref 3.5–5.1)
Sodium: 136 mmol/L (ref 135–145)
Total Bilirubin: 0.7 mg/dL (ref 0.3–1.2)
Total Protein: 6.2 g/dL — ABNORMAL LOW (ref 6.5–8.1)

## 2014-07-27 LAB — FERRITIN: Ferritin: 262 ng/mL (ref 24–336)

## 2014-07-27 MED ORDER — SODIUM CHLORIDE 0.9 % IJ SOLN
10.0000 mL | INTRAMUSCULAR | Status: DC | PRN
  Administered 2014-07-27: 10 mL via INTRAVENOUS
  Filled 2014-07-27: qty 10

## 2014-07-27 MED ORDER — HEPARIN SOD (PORK) LOCK FLUSH 100 UNIT/ML IV SOLN
500.0000 [IU] | Freq: Once | INTRAVENOUS | Status: AC
  Administered 2014-07-27: 500 [IU] via INTRAVENOUS

## 2014-07-27 MED ORDER — HEPARIN SOD (PORK) LOCK FLUSH 100 UNIT/ML IV SOLN
INTRAVENOUS | Status: AC
Start: 2014-07-27 — End: 2014-07-27
  Filled 2014-07-27: qty 5

## 2014-07-27 NOTE — Telephone Encounter (Signed)
Patient's glucose at 255 today at lunch. RN called patient to let him know this. Pt states that his Glucose was 77 this morning.He received Lantus injection 20 units before lunch.  He will check his blood glucose this evening before dinner.

## 2014-07-28 ENCOUNTER — Encounter: Payer: Self-pay | Admitting: Hematology and Oncology

## 2014-07-28 NOTE — Progress Notes (Signed)
[facility] day:  07/27/2014  Chief Complaint: HERB BELTRE is an 81 y.o. male with bladder cancer who is seen for 3 month assessment.  HPI: The patient was last seen in the medical oncology clinic on 04/27/2014. At that time he felt well. He denied any hematuria. He did note some soreness across his lower abdomen/pelvis. He had been more active.  Exam at last visit was unremarkable. CBC included a hematocrit of 36.8, hemoglobin 11.8, MCV 98, platelets 91,000, white count 5600 with an ANC of 4300.  Creatinine was 1.61 with an estimated creatinine clearance of 43 ml/minute. Ferritin was 327 with a normal iron saturation.  Oral iron was discontinued.  Per his wife, he underwent cystoscopy in 04/2014 by Dr. Elnoria Howard. He is unaware of the results. CT scans may have also been performed.  He notes a lot of problems across his hip especially when outside working. He has states that he doesn't lose sleep over it. His weight is stable. Overall he "feels fortunate".  Past Medical History  Diagnosis Date  . Pancreatic cancer     s/p whipple procedure  . Diabetes   . Hypertension   . Bladder cancer   . Hematuria, gross 07/23/2014   Past Surgical History  Procedure Laterality Date  . Hernia repair    . Whipple procedure     Family History  Problem Relation Age of Onset  . Cerebrovascular Accident Father   . Stomach cancer Sister    Social History:  reports that he has never smoked. He does not have any smokeless tobacco history on file. He reports that he does not drink alcohol or use illicit drugs.  The patient is accompanied by his wife.  Allergies:  Allergies  Allergen Reactions  . Penicillins    Current Medications: Current Outpatient Prescriptions  Medication Sig Dispense Refill  . amLODipine-valsartan (EXFORGE) 5-320 MG per tablet Take by mouth.    Marland Kitchen aspirin 81 MG tablet Take by mouth.    . memantine (NAMENDA) 10 MG tablet Take 10 mg by  mouth 2 (two) times daily.    Marland Kitchen omeprazole (PRILOSEC) 10 MG capsule Take by mouth.    . simvastatin (ZOCOR) 5 MG tablet Take by mouth.    . sitaGLIPtin (JANUVIA) 25 MG tablet Take by mouth.    . ferrous sulfate 325 (65 FE) MG EC tablet Take 325 mg by mouth 3 (three) times daily with meals.    Marland Kitchen glimepiride (AMARYL) 2 MG tablet Take 2 mg by mouth daily with breakfast. Daily at 3:30    . insulin glargine (LANTUS) 100 UNIT/ML injection Inject into the skin at bedtime.    . Insulin Glargine (LANTUS) 100 UNIT/ML Solostar Pen Inject 20 Units into the skin daily after breakfast.    . INSULIN REGULAR INSULIN REGULAR, 100UNIT/ML (Injection Solution) - Historical Medication  (100 U/ML) Active    . ondansetron (ZOFRAN) 4 MG tablet Take 4 mg by mouth every 6 (six) hours as needed for nausea or vomiting.     No current facility-administered medications for this visit.   Facility-Administered Medications Ordered in Other Visits  Medication Dose Route Frequency Provider Last Rate Last Dose  . sodium chloride 0.9 % injection 10 mL  10 mL Intravenous PRN Forest Gleason, MD   10 mL at 07/27/14 1136   Review of Systems:  GENERAL:  Feels good.  No fevers, sweats or weight loss.  Weight stable. PERFORMANCE STATUS (ECOG):  1 HEENT:  No visual changes, runny nose, sore throat, mouth sores or tenderness. Lungs: No shortness of breath or cough.  No hemoptysis. Cardiac:  No chest pain, palpitations, orthopnea, or PND. GI:  No nausea, vomiting, diarrhea, constipation, melena or hematochezia. GU:  No urgency, frequency, dysuria, or hematuria. Musculoskeletal:  Chronic problems across hips.  No back pain.  No joint pain.  No muscle tenderness. Extremities:  No pain or swelling. Skin:  No rashes or skin changes. Neuro:  No headache, numbness or weakness, balance or coordination issues. Endocrine:  No diabetes, thyroid issues, hot flashes or night sweats. Psych:  No mood changes, depression or anxiety. Pain:  No focal  pain. Review of systems:  All other systems reviewed and found to be negative.   Physical Exam: Blood pressure 147/76, pulse 58, temperature 96.8 F (36 C), temperature source Tympanic, weight 144 lb 6.4 oz (65.5 kg). GENERAL:  Well developed, well nourished, sitting comfortably in the exam room in no acute distress. MENTAL STATUS:  Alert and oriented to person, place and time. HEAD:  Male pattern baldness with sun induced changes to scalp.  Normocephalic, atraumatic, face symmetric, no Cushingoid features. EYES:  Glasses.  Blue eyes.  Pupils equal round and reactive to light and accomodation.  No conjunctivitis or scleral icterus. ENT:  Oropharynx clear without lesion.  Tongue normal. Mucous membranes moist.  RESPIRATORY:  Clear to auscultation without rales, wheezes or rhonchi. CARDIOVASCULAR:  Regular rate and rhythm without murmur, rub or gallop. ABDOMEN:  Soft, non-tender, with active bowel sounds, and no hepatosplenomegaly.  No masses. SKIN:  No rashes, ulcers or lesions. EXTREMITIES: No edema, no skin discoloration or tenderness.  No palpable cords. LYMPH NODES: No palpable cervical, supraclavicular, axillary or inguinal adenopathy  NEUROLOGICAL: Poor memory. PSYCH:  Appropriate.  Clinical Support on 07/27/2014  Component Date Value Ref Range Status  . WBC 07/27/2014 3.8  3.8 - 10.6 K/uL Final  . RBC 07/27/2014 3.76* 4.40 - 5.90 MIL/uL Final  . Hemoglobin 07/27/2014 11.7* 13.0 - 18.0 g/dL Final  . HCT 07/27/2014 36.0* 40.0 - 52.0 % Final  . MCV 07/27/2014 95.7  80.0 - 100.0 fL Final  . MCH 07/27/2014 31.1  26.0 - 34.0 pg Final  . MCHC 07/27/2014 32.5  32.0 - 36.0 g/dL Final  . RDW 07/27/2014 14.9* 11.5 - 14.5 % Final  . Platelets 07/27/2014 107* 150 - 440 K/uL Final  . Neutrophils Relative % 07/27/2014 77   Final  . Neutro Abs 07/27/2014 2.9  1.4 - 6.5 K/uL Final  . Lymphocytes Relative 07/27/2014 11   Final  . Lymphs Abs 07/27/2014 0.4* 1.0 - 3.6 K/uL Final  . Monocytes  Relative 07/27/2014 11   Final  . Monocytes Absolute 07/27/2014 0.4  0.2 - 1.0 K/uL Final  . Eosinophils Relative 07/27/2014 1   Final  . Eosinophils Absolute 07/27/2014 0.1  0 - 0.7 K/uL Final  . Basophils Relative 07/27/2014 0   Final  . Basophils Absolute 07/27/2014 0.0  0 - 0.1 K/uL Final  . Sodium 07/27/2014 136  135 - 145 mmol/L Final   A-LINE DRAW  . Potassium 07/27/2014 4.4  3.5 - 5.1 mmol/L Final  . Chloride 07/27/2014 106  101 - 111 mmol/L Final  . CO2 07/27/2014 24  22 - 32 mmol/L Final  . Glucose, Bld 07/27/2014 255* 65 - 99 mg/dL Final  . BUN 07/27/2014 38* 6 - 20 mg/dL Final  . Creatinine, Ser 07/27/2014 1.78* 0.61 - 1.24 mg/dL Final  . Calcium 07/27/2014  8.8* 8.9 - 10.3 mg/dL Final  . Total Protein 07/27/2014 6.2* 6.5 - 8.1 g/dL Final  . Albumin 07/27/2014 3.9  3.5 - 5.0 g/dL Final  . AST 07/27/2014 22  15 - 41 U/L Final  . ALT 07/27/2014 15* 17 - 63 U/L Final  . Alkaline Phosphatase 07/27/2014 106  38 - 126 U/L Final  . Total Bilirubin 07/27/2014 0.7  0.3 - 1.2 mg/dL Final  . GFR calc non Af Amer 07/27/2014 33* >60 mL/min Final  . GFR calc Af Amer 07/27/2014 38* >60 mL/min Final   Comment: (NOTE) The eGFR has been calculated using the CKD EPI equation. This calculation has not been validated in all clinical situations. eGFR's persistently <60 mL/min signify possible Chronic Kidney Disease.   . Anion gap 07/27/2014 6  5 - 15 Final  . Ferritin 07/27/2014 262  24 - 336 ng/mL Final   Assessment:  REYNOLDS KITTEL is an 81 y.o. male with a history of clinical stage II (T2NxM0) bladder cancer.  He underwent a TURBT on 08/14/2013.  Pathology revealed a 3 cm high grade base of bladder tumor which invaded the muscularis propria.  There was lymphovascular invasion.   He received 7 weekly cycles of carboplatin (10/03/2013 - 11/21/2013) with radiation (10/17/2013 - 12/08/2013).  Chest, abdomen, and pelvic CT scan on 02/23/2014 revealed no evidence of metastic disease.  He  underwent cystoscopy in 04/2014 per his wife (unknown results).  He may have undergone imaging studies.  He has a history of pancreatic cancer in the 1960s/1970s.  He underwent Whipple.  He has had no evidence of recurrent disease.  He had been on oral iron TID for some time likely secondary to initial hematuria.  Iron was discontinued on 04/27/2014.  Diet is good His last colonoscopy was 4 years ago.  He denies any melena or hematochezia.  Iron stores are good.  He has chronic kidney disease (creatinine clearance 43 ml/min).  Symptomatically, he feels well.  He denies any hematuria.  He has chronic soreness across the lower abdomen or pelvis which appears musculoskeletal.  His memory is poor.  Plan: 1. Labs today:  CBC with diff, CMP, ferritin. 2. Obtain copy of cystoscopy report +/- scans from Dr Guinevere Ferrari office. 3. Contact patient with results of above report [phone]; [phone]) 4. Port flush today and every 8 weeks. 5. RTC in 3 months for MD assessment and labs (CBC, CMP, ferritin). 6. Anticipate decreasing frequency of visits if stable.  Consider port removal at next visit.  Lequita Asal, MD  07/27/2014, 4:57 PM

## 2014-08-01 ENCOUNTER — Encounter: Payer: Self-pay | Admitting: *Deleted

## 2014-08-07 ENCOUNTER — Other Ambulatory Visit: Payer: Self-pay | Admitting: Urology

## 2014-08-07 DIAGNOSIS — R319 Hematuria, unspecified: Secondary | ICD-10-CM

## 2014-08-08 ENCOUNTER — Encounter: Payer: Self-pay | Admitting: Urology

## 2014-08-08 ENCOUNTER — Ambulatory Visit (INDEPENDENT_AMBULATORY_CARE_PROVIDER_SITE_OTHER): Payer: Medicare Other | Admitting: Urology

## 2014-08-08 VITALS — BP 131/67 | HR 73 | Ht 67.0 in | Wt 144.7 lb

## 2014-08-08 DIAGNOSIS — D494 Neoplasm of unspecified behavior of bladder: Secondary | ICD-10-CM | POA: Diagnosis not present

## 2014-08-08 LAB — MICROSCOPIC EXAMINATION: Bacteria, UA: NONE SEEN

## 2014-08-08 LAB — URINALYSIS, COMPLETE
Bilirubin, UA: NEGATIVE
GLUCOSE, UA: NEGATIVE
KETONES UA: NEGATIVE
Leukocytes, UA: NEGATIVE
Nitrite, UA: NEGATIVE
Protein, UA: NEGATIVE
RBC UA: NEGATIVE
Specific Gravity, UA: 1.015 (ref 1.005–1.030)
UUROB: 0.2 mg/dL (ref 0.2–1.0)
pH, UA: 5 (ref 5.0–7.5)

## 2014-08-08 MED ORDER — LIDOCAINE HCL 2 % EX GEL
1.0000 "application " | Freq: Once | CUTANEOUS | Status: AC
  Administered 2014-08-08: 1 via URETHRAL

## 2014-08-08 MED ORDER — CIPROFLOXACIN HCL 500 MG PO TABS
500.0000 mg | ORAL_TABLET | Freq: Once | ORAL | Status: AC
  Administered 2014-08-08: 500 mg via ORAL

## 2014-08-08 NOTE — Progress Notes (Signed)
   Subjective:    Patient ID: Colton Tyler, male    DOB: 19-Aug-1925, 81 y.o.   MRN: [id number]  HPI 81 year old male who had a suspected recurrent bladder tumor on cystoscopy 3 months ago this is a re-cystoscopy and cystoscopy today I found no tumor and a normal-appearing bladder the body of the report is below Review of Systems    head-normocephalic pupils equal and reactive to light hearing good  Genitourinary-no frequency urgency dysuria incontinence gross hematuria or dysuria, nocturia 2 low excellent Objective:   Physical Exam o rientation-oriented 3 cooperative  Genitourinary-penis uncircumcised testes descended and normal prostate small firm and rectal sphincter normal  Abdomen abdomen soft bowel sounds present no masses palpatedin no organomegaly     Cystoscopy Procedure Note  Patient identification was confirmed, informed consent was obtained, and patient was prepped using Betadine solution.  Lidocaine jelly was administered per urethral meatus.    Preoperative abx where received prior to procedure.     Pre-Procedure: - Inspection reveals a normal caliber ureteral meatus.  Procedure: The flexible cystoscope was introduced without difficulty - No urethral strictures/lesions are present. - Normal prostate  - Normal bladder neck - Bilateral ureteral orifices identified - Bladder mucosa  reveals no ulcers, tumors, or lesions - No bladder stones - No trabeculation  Retroflexion shows normal  bladder neck and no tumors on the anterior portion of the bladder   Post-Procedure: - Patient tolerated the procedure well         Assessment & Plan:  1. Bladder neoplasm Patient has no recurrent evidence of bladder neoplasm - Urinalysis, Complete - ciprofloxacin (CIPRO) tablet 500 mg; Take 1 tablet (500 mg total) by mouth once. - lidocaine (XYLOCAINE) 2 % jelly 1 application; Place 1 application into the urethra once.

## 2014-08-15 ENCOUNTER — Emergency Department
Admission: EM | Admit: 2014-08-15 | Discharge: 2014-08-15 | Disposition: A | Discharge status: Home / Self Care | Payer: Medicare Other | Attending: Emergency Medicine | Admitting: Emergency Medicine

## 2014-08-15 ENCOUNTER — Encounter: Payer: Self-pay | Admitting: Emergency Medicine

## 2014-08-15 DIAGNOSIS — Z794 Long term (current) use of insulin: Secondary | ICD-10-CM | POA: Diagnosis not present

## 2014-08-15 DIAGNOSIS — I10 Essential (primary) hypertension: Secondary | ICD-10-CM | POA: Insufficient documentation

## 2014-08-15 DIAGNOSIS — Z79899 Other long term (current) drug therapy: Secondary | ICD-10-CM | POA: Insufficient documentation

## 2014-08-15 DIAGNOSIS — Z880 Allergy status to penicillin: Secondary | ICD-10-CM | POA: Diagnosis not present

## 2014-08-15 DIAGNOSIS — Z09 Encounter for follow-up examination after completed treatment for conditions other than malignant neoplasm: Secondary | ICD-10-CM | POA: Diagnosis present

## 2014-08-15 DIAGNOSIS — Z0389 Encounter for observation for other suspected diseases and conditions ruled out: Secondary | ICD-10-CM | POA: Insufficient documentation

## 2014-08-15 DIAGNOSIS — IMO0001 Reserved for inherently not codable concepts without codable children: Secondary | ICD-10-CM

## 2014-08-15 DIAGNOSIS — E119 Type 2 diabetes mellitus without complications: Secondary | ICD-10-CM | POA: Diagnosis not present

## 2014-08-15 DIAGNOSIS — Z7982 Long term (current) use of aspirin: Secondary | ICD-10-CM | POA: Insufficient documentation

## 2014-08-15 NOTE — ED Notes (Addendum)
Patient ambulatory to triage with steady gait, without difficulty or distress noted; pt reports was told he had "hole in intestine"; st received a message on answering machine from unknown person; denies being seen by someone recently and individual did not leave name; pt denies any c/o

## 2014-08-15 NOTE — ED Provider Notes (Signed)
[facility] Emergency Department Provider Note    ____________________________________________  Time seen: 0810  I have reviewed the triage vital signs and the nursing notes.   HISTORY  Chief Complaint Follow-up   History limited by: Not Limited   HPI Colton Tyler is a 81 y.o. male who presents to the emergency department after getting a message on his voicemail last night telling him he had a hole in his intestine and to present to the emergency department. Unfortunately were not able to tell you left the message. The patient last saw Dr. Elnoria Howard, urologist, 1 week ago and underwent a cystoscope. However per note notes cystoscope did not show any concerning findings and I did discuss with Dr. Guinevere Ferrari morning he was unaware that his office contacted Colton Tyler for any reason. Colton Tyler himselfdenies any symptoms. Denies any symptoms. Denies any abdominal pain. Denies any fevers. Denies any change in bowel or bloody stool. Denies any vomiting.     Past Medical History  Diagnosis Date  . Pancreatic cancer     s/p whipple procedure  . Diabetes   . Hypertension   . Bladder cancer   . Hematuria, gross 07/23/2014    Patient Active Problem List   Diagnosis Date Noted  . Hematuria, gross 07/23/2014  . Bladder mass 06/09/2014  . Bladder neoplasm 06/09/2014  . Blood in feces 06/09/2014  . Diabetes 06/09/2014  . Frank hematuria 06/09/2014  . Blood in the urine 06/09/2014  . BP (high blood pressure) 06/09/2014  . Pancreatic cancer 06/09/2014  . Surgery follow-up examination 06/09/2014  . Diabetes mellitus, type 2 06/09/2014  . Bladder disease 06/09/2014  . Essential (primary) hypertension 06/09/2014  . Urinary catheter insertion/adjustment/removal 08/22/2013  . Fitting and adjustment of urinary device 08/22/2013  . Abnormal ECG 08/03/2013  . Arteriosclerosis of coronary artery 08/03/2013  . Carotid artery narrowing 08/03/2013  . HLD (hyperlipidemia)  08/03/2013  . Amnesia 08/03/2013  . OP (osteoporosis) 08/03/2013  . Beat, premature ventricular 08/03/2013    Past Surgical History  Procedure Laterality Date  . Hernia repair    . Whipple procedure    . Bladder surgery    . Whipple procedure      Current Outpatient Rx  Name  Route  Sig  Dispense  Refill  . amLODipine-valsartan (EXFORGE) 5-320 MG per tablet   Oral   Take by mouth.         Marland Kitchen aspirin 81 MG tablet   Oral   Take by mouth.         Marland Kitchen glimepiride (AMARYL) 2 MG tablet   Oral   Take 2 mg by mouth daily with breakfast. Daily at 3:30         . insulin glargine (LANTUS) 100 UNIT/ML injection   Subcutaneous   Inject into the skin at bedtime.         . Insulin Glargine (LANTUS) 100 UNIT/ML Solostar Pen   Subcutaneous   Inject 20 Units into the skin daily after breakfast.         . memantine (NAMENDA) 10 MG tablet   Oral   Take 10 mg by mouth 2 (two) times daily.         Marland Kitchen omeprazole (PRILOSEC) 10 MG capsule   Oral   Take by mouth.         . ondansetron (ZOFRAN) 4 MG tablet   Oral   Take 4 mg by mouth every 6 (six) hours as needed for nausea or vomiting.         Marland Kitchen  simvastatin (ZOCOR) 5 MG tablet   Oral   Take by mouth.         . sitaGLIPtin (JANUVIA) 25 MG tablet   Oral   Take by mouth.           Allergies Penicillins  Family History  Problem Relation Age of Onset  . Cerebrovascular Accident Father   . Stomach cancer Sister     Social History History  Substance Use Topics  . Smoking status: Never Smoker   . Smokeless tobacco: Not on file  . Alcohol Use: No    Review of Systems  Constitutional: Negative for fever. Cardiovascular: Negative for chest pain. Respiratory: Negative for shortness of breath. Gastrointestinal: Negative for abdominal pain, vomiting and diarrhea. Genitourinary: Negative for dysuria. Musculoskeletal: Negative for back pain. Skin: Negative for rash. Neurological: Negative for headaches,  focal weakness or numbness.   10-point ROS otherwise negative.  ____________________________________________   PHYSICAL EXAM:  VITAL SIGNS: ED Triage Vitals  Enc Vitals Group     BP 08/15/14 0703 161/87 mmHg     Pulse Rate 08/15/14 0703 55     Resp --      Temp 08/15/14 0703 97.9 F (36.6 C)     Temp Source 08/15/14 0703 Oral     SpO2 08/15/14 0703 97 %     Weight 08/15/14 0703 155 lb (70.308 kg)     Height 08/15/14 0703 5\' 7"  (1.702 m)   Constitutional: Alert and oriented. Well appearing and in no distress. Eyes: Conjunctivae are normal. PERRL. Normal extraocular movements. ENT   Head: Normocephalic and atraumatic.   Nose: No congestion/rhinnorhea.   Mouth/Throat: Mucous membranes are moist.   Neck: No stridor. Hematological/Lymphatic/Immunilogical: No cervical lymphadenopathy. Cardiovascular: Normal rate, regular rhythm.  No murmurs, rubs, or gallops. Respiratory: Normal respiratory effort without tachypnea nor retractions. Breath sounds are clear and equal bilaterally. No wheezes/rales/rhonchi. Gastrointestinal: Soft and nontender. No distention.  Genitourinary: Deferred Musculoskeletal: Normal range of motion in all extremities. No joint effusions.  No lower extremity tenderness nor edema. Neurologic:  Normal speech and language. No gross focal neurologic deficits are appreciated. Speech is normal.  Skin:  Skin is warm, dry and intact. No rash noted. Psychiatric: Mood and affect are normal. Speech and behavior are normal. Patient exhibits appropriate insight and judgment.  ____________________________________________    LABS (pertinent positives/negatives)  None  ____________________________________________   EKG  None  ____________________________________________    RADIOLOGY  None  ____________________________________________   PROCEDURES  Procedure(s) performed: None  Critical Care performed:  No  ____________________________________________   INITIAL IMPRESSION / ASSESSMENT AND PLAN / ED COURSE  Pertinent labs & imaging results that were available during my care of the patient were reviewed by me and considered in my medical decision making (see chart for details).   Patient presented to the emergency department after getting with Olga Millers telling him that he had a hole medicine test. Unclear if you cold or how they diagnosed this. Patient did not have any recent CT scans that I can find. Did have recent cystoscope that did not show any concerning problems. No addition patient is asymptomatic. Will discharge home. ____________________________________________   FINAL CLINICAL IMPRESSION(S) / ED DIAGNOSES  Final diagnoses:  Well adult     Nance Pear, MD 08/15/14 [phone]

## 2014-08-15 NOTE — ED Notes (Signed)
Pt reports receiving a phone call from the hospital stating that he has a hole in his intestine. States that he was here recently for bladder surgery. Denies any pain or other complications at this time. Family at bedside.

## 2014-08-15 NOTE — Discharge Instructions (Signed)
Please seek medical attention for any high fevers, chest pain, shortness of breath, change in behavior, persistent vomiting, bloody stool or any other new or concerning symptoms.  Health Maintenance A healthy lifestyle and preventative care can promote health and wellness.  Maintain regular health, dental, and eye exams.  Eat a healthy diet. Foods like vegetables, fruits, whole grains, low-fat dairy products, and lean protein foods contain the nutrients you need and are low in calories. Decrease your intake of foods high in solid fats, added sugars, and salt. Get information about a proper diet from your health care provider, if necessary.  Regular physical exercise is one of the most important things you can do for your health. Most adults should get at least 150 minutes of moderate-intensity exercise (any activity that increases your heart rate and causes you to sweat) each week. In addition, most adults need muscle-strengthening exercises on 2 or more days a week.   Maintain a healthy weight. The body mass index (BMI) is a screening tool to identify possible weight problems. It provides an estimate of body fat based on height and weight. Your health care provider can find your BMI and can help you achieve or maintain a healthy weight. For males 20 years and older:  A BMI below 18.5 is considered underweight.  A BMI of 18.5 to 24.9 is normal.  A BMI of 25 to 29.9 is considered overweight.  A BMI of 30 and above is considered obese.  Maintain normal blood lipids and cholesterol by exercising and minimizing your intake of saturated fat. Eat a balanced diet with plenty of fruits and vegetables. Blood tests for lipids and cholesterol should begin at age 26 and be repeated every 5 years. If your lipid or cholesterol levels are high, you are over age 52, or you are at high risk for heart disease, you may need your cholesterol levels checked more frequently.Ongoing high lipid and cholesterol levels  should be treated with medicines if diet and exercise are not working.  If you smoke, find out from your health care provider how to quit. If you do not use tobacco, do not start.  Lung cancer screening is recommended for adults aged 41-80 years who are at high risk for developing lung cancer because of a history of smoking. A yearly low-dose CT scan of the lungs is recommended for people who have at least a 30-pack-year history of smoking and are current smokers or have quit within the past 15 years. A pack year of smoking is smoking an average of 1 pack of cigarettes a day for 1 year (for example, a 30-pack-year history of smoking could mean smoking 1 pack a day for 30 years or 2 packs a day for 15 years). Yearly screening should continue until the smoker has stopped smoking for at least 15 years. Yearly screening should be stopped for people who develop a health problem that would prevent them from having lung cancer treatment.  If you choose to drink alcohol, do not have more than 2 drinks per day. One drink is considered to be 12 oz (360 mL) of beer, 5 oz (150 mL) of wine, or 1.5 oz (45 mL) of liquor.  Avoid the use of street drugs. Do not share needles with anyone. Ask for help if you need support or instructions about stopping the use of drugs.  High blood pressure causes heart disease and increases the risk of stroke. Blood pressure should be checked at least every 1-2 years. Ongoing high  blood pressure should be treated with medicines if weight loss and exercise are not effective.  If you are 46-46 years old, ask your health care provider if you should take aspirin to prevent heart disease.  Diabetes screening involves taking a blood sample to check your fasting blood sugar level. This should be done once every 3 years after age 27 if you are at a normal weight and without risk factors for diabetes. Testing should be considered at a younger age or be carried out more frequently if you are  overweight and have at least 1 risk factor for diabetes.  Colorectal cancer can be detected and often prevented. Most routine colorectal cancer screening begins at the age of 63 and continues through age 1. However, your health care provider may recommend screening at an earlier age if you have risk factors for colon cancer. On a yearly basis, your health care provider may provide home test kits to check for hidden blood in the stool. A small camera at the end of a tube may be used to directly examine the colon (sigmoidoscopy or colonoscopy) to detect the earliest forms of colorectal cancer. Talk to your health care provider about this at age 20 when routine screening begins. A direct exam of the colon should be repeated every 5-10 years through age 83, unless early forms of precancerous polyps or small growths are found.  People who are at an increased risk for hepatitis B should be screened for this virus. You are considered at high risk for hepatitis B if:  You were born in a country where hepatitis B occurs often. Talk with your health care provider about which countries are considered high risk.  Your parents were born in a high-risk country and you have not received a shot to protect against hepatitis B (hepatitis B vaccine).  You have HIV or AIDS.  You use needles to inject street drugs.  You live with, or have sex with, someone who has hepatitis B.  You are a man who has sex with other men (MSM).  You get hemodialysis treatment.  You take certain medicines for conditions like cancer, organ transplantation, and autoimmune conditions.  Hepatitis C blood testing is recommended for all people born from [birth year] through 1963 and any individual with known risk factors for hepatitis C.  Healthy men should no longer receive prostate-specific antigen (PSA) blood tests as part of routine cancer screening. Talk to your health care provider about prostate cancer screening.  Testicular cancer  screening is not recommended for adolescents or adult males who have no symptoms. Screening includes self-exam, a health care provider exam, and other screening tests. Consult with your health care provider about any symptoms you have or any concerns you have about testicular cancer.  Practice safe sex. Use condoms and avoid high-risk sexual practices to reduce the spread of sexually transmitted infections (STIs).  You should be screened for STIs, including gonorrhea and chlamydia if:  You are sexually active and are younger than 24 years.  You are older than 24 years, and your health care provider tells you that you are at risk for this type of infection.  Your sexual activity has changed since you were last screened, and you are at an increased risk for chlamydia or gonorrhea. Ask your health care provider if you are at risk.  If you are at risk of being infected with HIV, it is recommended that you take a prescription medicine daily to prevent HIV infection. This is called  pre-exposure prophylaxis (PrEP). You are considered at risk if:  You are a man who has sex with other men (MSM).  You are a heterosexual man who is sexually active with multiple partners.  You take drugs by injection.  You are sexually active with a partner who has HIV.  Talk with your health care provider about whether you are at high risk of being infected with HIV. If you choose to begin PrEP, you should first be tested for HIV. You should then be tested every 3 months for as long as you are taking PrEP.  Use sunscreen. Apply sunscreen liberally and repeatedly throughout the day. You should seek shade when your shadow is shorter than you. Protect yourself by wearing long sleeves, pants, a wide-brimmed hat, and sunglasses year round whenever you are outdoors.  Tell your health care provider of new moles or changes in moles, especially if there is a change in shape or color. Also, tell your health care provider if a  mole is larger than the size of a pencil eraser.  A one-time screening for abdominal aortic aneurysm (AAA) and surgical repair of large AAAs by ultrasound is recommended for men aged 62-75 years who are current or former smokers.  Stay current with your vaccines (immunizations). Document Released: 08/22/2007 Document Revised: 02/28/2013 Document Reviewed: 07/21/2010 [facility] Patient Information 2015 [place], [place]. This information is not intended to replace advice given to you by your health care provider. Make sure you discuss any questions you have with your health care provider.

## 2014-09-07 ENCOUNTER — Other Ambulatory Visit: Payer: Self-pay | Admitting: Urology

## 2014-09-11 NOTE — Telephone Encounter (Signed)
Can pt have refill?  

## 2014-09-21 ENCOUNTER — Inpatient Hospital Stay: Payer: Medicare Other | Attending: Oncology

## 2014-09-21 DIAGNOSIS — C801 Malignant (primary) neoplasm, unspecified: Secondary | ICD-10-CM

## 2014-09-21 DIAGNOSIS — C679 Malignant neoplasm of bladder, unspecified: Secondary | ICD-10-CM | POA: Diagnosis not present

## 2014-09-21 MED ORDER — SODIUM CHLORIDE 0.9 % IJ SOLN
10.0000 mL | Freq: Once | INTRAMUSCULAR | Status: AC
  Administered 2014-09-21: 10 mL via INTRAVENOUS
  Filled 2014-09-21: qty 10

## 2014-09-21 MED ORDER — HEPARIN SOD (PORK) LOCK FLUSH 100 UNIT/ML IV SOLN
500.0000 [IU] | Freq: Once | INTRAVENOUS | Status: AC
  Administered 2014-09-21: 500 [IU] via INTRAVENOUS
  Filled 2014-09-21: qty 5

## 2014-09-27 ENCOUNTER — Ambulatory Visit (INDEPENDENT_AMBULATORY_CARE_PROVIDER_SITE_OTHER): Payer: Medicare Other | Admitting: Urology

## 2014-09-27 DIAGNOSIS — C672 Malignant neoplasm of lateral wall of bladder: Secondary | ICD-10-CM

## 2014-09-27 NOTE — Progress Notes (Signed)
09/27/2014 2:27 PM   Colton Tyler 1926-02-27 [id number]  Referring provider: Kirk Ruths, MD [place], [place] [address]  No chief complaint on file.   HPI: History of bladder cancer needs follow-up with cystoscopy. Follow-up is in 6 months if cystoscopy today is normal.     PMH: Past Medical History  Diagnosis Date  . Pancreatic cancer     s/p whipple procedure  . Diabetes   . Hypertension   . Bladder cancer   . Hematuria, gross 07/23/2014    Surgical History: Past Surgical History  Procedure Laterality Date  . Hernia repair    . Whipple procedure    . Bladder surgery    . Whipple procedure      Home Medications:    Medication List       This list is accurate as of: 09/27/14  2:27 PM.  Always use your most recent med list.               amLODipine-valsartan 5-320 MG per tablet  Commonly known as:  EXFORGE  Take by mouth.     aspirin 81 MG tablet  Take by mouth.     glimepiride 2 MG tablet  Commonly known as:  AMARYL  Take 2 mg by mouth daily with breakfast. Daily at 3:30     insulin glargine 100 UNIT/ML injection  Commonly known as:  LANTUS  Inject into the skin at bedtime.     Insulin Glargine 100 UNIT/ML Solostar Pen  Commonly known as:  LANTUS  Inject 20 Units into the skin daily after breakfast.     memantine 10 MG tablet  Commonly known as:  NAMENDA  Take 10 mg by mouth 2 (two) times daily.     omeprazole 10 MG capsule  Commonly known as:  PRILOSEC  Take by mouth.     ondansetron 4 MG tablet  Commonly known as:  ZOFRAN  Take 4 mg by mouth every 6 (six) hours as needed for nausea or vomiting.     simvastatin 5 MG tablet  Commonly known as:  ZOCOR  Take by mouth.     sitaGLIPtin 25 MG tablet  Commonly known as:  JANUVIA  Take by mouth.        Allergies:  Allergies  Allergen Reactions  . Penicillins     Family History: Family History  Problem Relation Age of Onset  . Cerebrovascular Accident  Father   . Stomach cancer Sister     Social History:  reports that he has never smoked. He does not have any smokeless tobacco history on file. He reports that he does not drink alcohol or use illicit drugs.  ROS:                                        Physical Exam: There were no vitals taken for this visit.  Constitutional:  Alert and oriented, No acute distress. HEENT: [place] AT, moist mucus membranes.  Trachea midline, no masses. Cardiovascular: No clubbing, cyanosis, or edema. Respiratory: Normal respiratory effort, no increased work of breathing. GI: Abdomen is soft, nontender, nondistended, no abdominal masses GU: No CVA tenderness. Small prostate no tumors masses or growths Skin: No rashes, bruises or suspicious lesions. Lymph: No cervical or inguinal adenopathy. Neurologic: Grossly intact, no focal deficits, moving all 4 extremities. Psychiatric: Normal mood and affect.  Laboratory Data: Lab  Results  Component Value Date   WBC 3.8 07/27/2014   HGB 11.7* 07/27/2014   HCT 36.0* 07/27/2014   MCV 95.7 07/27/2014   PLT 107* 07/27/2014    Lab Results  Component Value Date   CREATININE 1.78* 07/27/2014    No results found for: PSA  No results found for: TESTOSTERONE  No results found for: HGBA1C  Urinalysis    Component Value Date/Time   GLUCOSEU Negative 08/08/2014 1423   BILIRUBINUR Negative 08/08/2014 1423   NITRITE Negative 08/08/2014 1423   LEUKOCYTESUR Negative 08/08/2014 1423    Pertinent Imaging: CT scan as pancreatic cancer  Assessment & Plan: Follow-up cystoscopy  There are no diagnoses linked to this encounter.  No Follow-up on file.  Collier Flowers, [facility] [address] [place], [place] [address] [phone]

## 2014-10-21 ENCOUNTER — Emergency Department
Admission: EM | Admit: 2014-10-21 | Discharge: 2014-10-22 | Disposition: A | Discharge status: Home / Self Care | Payer: Medicare Other | Attending: Emergency Medicine | Admitting: Emergency Medicine

## 2014-10-21 ENCOUNTER — Other Ambulatory Visit: Payer: Self-pay

## 2014-10-21 DIAGNOSIS — G8929 Other chronic pain: Secondary | ICD-10-CM | POA: Diagnosis not present

## 2014-10-21 DIAGNOSIS — Y998 Other external cause status: Secondary | ICD-10-CM | POA: Diagnosis not present

## 2014-10-21 DIAGNOSIS — Y9389 Activity, other specified: Secondary | ICD-10-CM | POA: Diagnosis not present

## 2014-10-21 DIAGNOSIS — Y9289 Other specified places as the place of occurrence of the external cause: Secondary | ICD-10-CM | POA: Diagnosis not present

## 2014-10-21 DIAGNOSIS — M25559 Pain in unspecified hip: Secondary | ICD-10-CM | POA: Diagnosis not present

## 2014-10-21 DIAGNOSIS — Z794 Long term (current) use of insulin: Secondary | ICD-10-CM | POA: Insufficient documentation

## 2014-10-21 DIAGNOSIS — E119 Type 2 diabetes mellitus without complications: Secondary | ICD-10-CM | POA: Insufficient documentation

## 2014-10-21 DIAGNOSIS — R251 Tremor, unspecified: Secondary | ICD-10-CM | POA: Diagnosis present

## 2014-10-21 DIAGNOSIS — Z79899 Other long term (current) drug therapy: Secondary | ICD-10-CM | POA: Diagnosis not present

## 2014-10-21 DIAGNOSIS — I10 Essential (primary) hypertension: Secondary | ICD-10-CM | POA: Insufficient documentation

## 2014-10-21 DIAGNOSIS — Z7982 Long term (current) use of aspirin: Secondary | ICD-10-CM | POA: Diagnosis not present

## 2014-10-21 DIAGNOSIS — T50991A Poisoning by other drugs, medicaments and biological substances, accidental (unintentional), initial encounter: Secondary | ICD-10-CM | POA: Insufficient documentation

## 2014-10-21 DIAGNOSIS — Z880 Allergy status to penicillin: Secondary | ICD-10-CM | POA: Diagnosis not present

## 2014-10-21 DIAGNOSIS — T50901A Poisoning by unspecified drugs, medicaments and biological substances, accidental (unintentional), initial encounter: Secondary | ICD-10-CM

## 2014-10-21 LAB — URINALYSIS COMPLETE WITH MICROSCOPIC (ARMC ONLY)
BILIRUBIN URINE: NEGATIVE
Bacteria, UA: NONE SEEN
Glucose, UA: NEGATIVE mg/dL
Hgb urine dipstick: NEGATIVE
Ketones, ur: NEGATIVE mg/dL
Leukocytes, UA: NEGATIVE
Nitrite: NEGATIVE
Protein, ur: NEGATIVE mg/dL
SPECIFIC GRAVITY, URINE: 1.01 (ref 1.005–1.030)
Squamous Epithelial / LPF: NONE SEEN
WBC, UA: NONE SEEN WBC/hpf (ref 0–5)
pH: 6 (ref 5.0–8.0)

## 2014-10-21 LAB — CBC WITH DIFFERENTIAL/PLATELET
BASOS PCT: 0 %
Basophils Absolute: 0 10*3/uL (ref 0–0.1)
EOS ABS: 0 10*3/uL (ref 0–0.7)
Eosinophils Relative: 0 %
HEMATOCRIT: 36.3 % — AB (ref 40.0–52.0)
HEMOGLOBIN: 11.9 g/dL — AB (ref 13.0–18.0)
LYMPHS ABS: 0.1 10*3/uL — AB (ref 1.0–3.6)
Lymphocytes Relative: 1 %
MCH: 31 pg (ref 26.0–34.0)
MCHC: 32.9 g/dL (ref 32.0–36.0)
MCV: 94.2 fL (ref 80.0–100.0)
Monocytes Absolute: 0.4 10*3/uL (ref 0.2–1.0)
Monocytes Relative: 4 %
NEUTROS PCT: 95 %
Neutro Abs: 10.3 10*3/uL — ABNORMAL HIGH (ref 1.4–6.5)
Platelets: 100 10*3/uL — ABNORMAL LOW (ref 150–440)
RBC: 3.85 MIL/uL — AB (ref 4.40–5.90)
RDW: 14.3 % (ref 11.5–14.5)
WBC: 10.9 10*3/uL — ABNORMAL HIGH (ref 3.8–10.6)

## 2014-10-21 LAB — COMPREHENSIVE METABOLIC PANEL
ALBUMIN: 3.9 g/dL (ref 3.5–5.0)
ALK PHOS: 83 U/L (ref 38–126)
ALT: 14 U/L — AB (ref 17–63)
AST: 24 U/L (ref 15–41)
Anion gap: 9 (ref 5–15)
BILIRUBIN TOTAL: 0.6 mg/dL (ref 0.3–1.2)
BUN: 34 mg/dL — ABNORMAL HIGH (ref 6–20)
CALCIUM: 9 mg/dL (ref 8.9–10.3)
CHLORIDE: 104 mmol/L (ref 101–111)
CO2: 23 mmol/L (ref 22–32)
CREATININE: 1.46 mg/dL — AB (ref 0.61–1.24)
GFR calc Af Amer: 48 mL/min — ABNORMAL LOW (ref >60)
GFR calc non Af Amer: 42 mL/min — ABNORMAL LOW (ref >60)
Glucose, Bld: 111 mg/dL — ABNORMAL HIGH (ref 65–99)
POTASSIUM: 4.1 mmol/L (ref 3.5–5.1)
Sodium: 136 mmol/L (ref 135–145)
TOTAL PROTEIN: 6 g/dL — AB (ref 6.5–8.1)

## 2014-10-21 LAB — GLUCOSE, CAPILLARY: GLUCOSE-CAPILLARY: 101 mg/dL — AB (ref 65–99)

## 2014-10-21 NOTE — Discharge Instructions (Signed)
Please follow-up with Dr. Ouida Sills in the next 1-2 days.  It seems that your symptoms were likely due to accidentally ingesting an unknown medicine, but at present you symptoms seem to have resolved.  Also, we noticed that you have a very slight fever when you first arrived to the emergency room and this could be a sign of an early infection, though right now you're not showing any sign of infection by examination. Your white blood cell count is slightly elevated, and this may be due to many reasons but should you develop another fever, your symptoms arise again like shakes or chills, you feel weak or lightheaded, have a cough, abdominal pain, weakness, or other new concerns arise please come back to the emergency room for evaluation.  See Dr. Ouida Sills in 1 or 2 days for a recheck.

## 2014-10-21 NOTE — ED Notes (Signed)
Pt in with co uncontrollable shaking, started after taking unknown pill today for hip pain.

## 2014-10-21 NOTE — ED Provider Notes (Signed)
[facility] Emergency Department Provider Note  ____________________________________________  Time seen: Approximately 10:01 PM  I have reviewed the triage vital signs and the nursing notes.   HISTORY  Chief Complaint Shaking    HPI Colton Tyler is a 81 y.o. male is a history of diabetes and hypertension who reports that that around 3 or 4 PM today he took a pill from the medicine cabinet, is unsure what it is and to get for pain in his hips which she has had chronically. He thought this was an aspirin pill, but about 20 minutes after he experienced a shaking episode and felt weak and lightheaded which lasted for about 3 or 4 hours. He now reports he feels fine and the symptoms resolved. He went to church evidently, and this went okay but felt weak throughout.  No chest pain or trouble breathing. He has not had a cough. He has not felt ill recently. He denies having any fever or chills, but did have this episode of brief shaking in both hands. Judges blood sugar once daily and was normal this morning as he is on insulin. He reports no abdominal pain or nausea or vomiting.  States he feels fine right now and his family including wife and daughter state that he is acting normally and looks normal now too.  Discussed with him that he does have a low-grade temperature in the emergency room, and he states he does not have any symptoms of why this would be. He has not felt ill recently except for this brief episode is now resolved.  No numbness or tingling. No weakness arm or legs.   Past Medical History  Diagnosis Date  . Pancreatic cancer     s/p whipple procedure  . Diabetes   . Hypertension   . Bladder cancer   . Hematuria, gross 07/23/2014    Patient Active Problem List   Diagnosis Date Noted  . Hematuria, gross 07/23/2014  . Bladder mass 06/09/2014  . Bladder neoplasm 06/09/2014  . Blood in feces 06/09/2014  . Diabetes 06/09/2014  . Frank  hematuria 06/09/2014  . Blood in the urine 06/09/2014  . BP (high blood pressure) 06/09/2014  . Pancreatic cancer 06/09/2014  . Surgery follow-up examination 06/09/2014  . Diabetes mellitus, type 2 06/09/2014  . Bladder disease 06/09/2014  . Essential (primary) hypertension 06/09/2014  . Urinary catheter insertion/adjustment/removal 08/22/2013  . Fitting and adjustment of urinary device 08/22/2013  . Abnormal ECG 08/03/2013  . Arteriosclerosis of coronary artery 08/03/2013  . Carotid artery narrowing 08/03/2013  . HLD (hyperlipidemia) 08/03/2013  . Amnesia 08/03/2013  . OP (osteoporosis) 08/03/2013  . Beat, premature ventricular 08/03/2013    Past Surgical History  Procedure Laterality Date  . Hernia repair    . Whipple procedure    . Bladder surgery    . Whipple procedure      Current Outpatient Rx  Name  Route  Sig  Dispense  Refill  . amLODipine-valsartan (EXFORGE) 5-320 MG per tablet   Oral   Take by mouth.         Marland Kitchen aspirin 81 MG tablet   Oral   Take by mouth.         Marland Kitchen glimepiride (AMARYL) 2 MG tablet   Oral   Take 2 mg by mouth daily with breakfast. Daily at 3:30         . insulin glargine (LANTUS) 100 UNIT/ML injection   Subcutaneous   Inject into the skin at  bedtime.         . Insulin Glargine (LANTUS) 100 UNIT/ML Solostar Pen   Subcutaneous   Inject 20 Units into the skin daily after breakfast.         . memantine (NAMENDA) 10 MG tablet   Oral   Take 10 mg by mouth 2 (two) times daily.         Marland Kitchen omeprazole (PRILOSEC) 10 MG capsule   Oral   Take by mouth.         . ondansetron (ZOFRAN) 4 MG tablet   Oral   Take 4 mg by mouth every 6 (six) hours as needed for nausea or vomiting.         . simvastatin (ZOCOR) 5 MG tablet   Oral   Take by mouth.         . sitaGLIPtin (JANUVIA) 25 MG tablet   Oral   Take by mouth.           Allergies Penicillins  Family History  Problem Relation Age of Onset  . Cerebrovascular  Accident Father   . Stomach cancer Sister     Social History Social History  Substance Use Topics  . Smoking status: Never Smoker   . Smokeless tobacco: Not on file  . Alcohol Use: No    Review of Systems Constitutional: No fever/chills Eyes: No visual changes. ENT: No sore throat. Cardiovascular: Denies chest pain. Respiratory: Denies shortness of breath. Gastrointestinal: No abdominal pain.  No nausea, no vomiting.  No diarrhea.  No constipation. Genitourinary: Negative for dysuria. Musculoskeletal: Negative for back pain. Skin: Negative for rash. Neurological: Negative for headaches, focal weakness or numbness. Felt kind of weak in the legs and fatigue for about 2-3 hours after taking this unknown medication.  10-point ROS otherwise negative.  ____________________________________________   PHYSICAL EXAM:  VITAL SIGNS: ED Triage Vitals  Enc Vitals Group     BP 10/21/14 2113 127/61 mmHg     Pulse Rate 10/21/14 2113 101     Resp 10/21/14 2113 96     Temp 10/21/14 2113 100.2 F (37.9 C)     Temp Source 10/21/14 2113 Oral     SpO2 10/21/14 2113 97 %     Weight 10/21/14 2113 150 lb (68.04 kg)     Height 10/21/14 2113 5\' 7"  (1.702 m)     Head Cir --      Peak Flow --      Pain Score --      Pain Loc --      Pain Edu? --      Excl. in [place]? --     Constitutional: Alert and oriented. Well appearing and in no acute distress. Eyes: Conjunctivae are normal. PERRL. EOMI. Head: Atraumatic. Nose: No congestion/rhinnorhea. Mouth/Throat: Mucous membranes are moist.  Oropharynx non-erythematous. Neck: No stridor.   Cardiovascular: Normal rate, regular rhythm. Grossly normal heart sounds.  Good peripheral circulation. Respiratory: Normal respiratory effort.  No retractions. Lungs CTAB. Gastrointestinal: Soft and nontender. No distention. No abdominal bruits. No CVA tenderness. Musculoskeletal: No lower extremity tenderness nor edema.  No joint effusions. Neurologic:  Normal  speech and language. No gross focal neurologic deficits are appreciated. Skin:  Skin is warm, dry and intact. No rash noted. Psychiatric: Mood and affect are normal. Speech and behavior are normal.  Essentially normal physical exam for age at this time.  ____________________________________________   LABS (all labs ordered are listed, but only abnormal results are displayed)  Labs Reviewed  URINALYSIS  COMPLETEWITH MICROSCOPIC ([facility] ONLY) - Abnormal; Notable for the following:    Color, Urine STRAW (*)    APPearance CLEAR (*)    All other components within normal limits  CBC WITH DIFFERENTIAL/PLATELET - Abnormal; Notable for the following:    WBC 10.9 (*)    RBC 3.85 (*)    Hemoglobin 11.9 (*)    HCT 36.3 (*)    Platelets 100 (*)    Neutro Abs 10.3 (*)    Lymphs Abs 0.1 (*)    All other components within normal limits  COMPREHENSIVE METABOLIC PANEL - Abnormal; Notable for the following:    Glucose, Bld 111 (*)    BUN 34 (*)    Creatinine, Ser 1.46 (*)    Total Protein 6.0 (*)    ALT 14 (*)    GFR calc non Af Amer 42 (*)    GFR calc Af Amer 48 (*)    All other components within normal limits  GLUCOSE, CAPILLARY - Abnormal; Notable for the following:    Glucose-Capillary 101 (*)    All other components within normal limits  CBG MONITORING, ED   ____________________________________________  EKG  Reviewed and interpreted by me Normal sinus rhythm Left anterior fascicular block Ventricular rate 80 PR 200 QRS 110 QTc 440  Left anterior fascicular block, normal sinus rhythm. No ischemic changes. ____________________________________________  RADIOLOGY  No indication for imaging. No focal neurologic symptoms, reassuring exam in the ER. No cough, trouble breathing or respiratory symptoms. Not hypoxic. ____________________________________________   PROCEDURES  Procedure(s) performed: None  Critical Care performed:  No  ____________________________________________   INITIAL IMPRESSION / ASSESSMENT AND PLAN / ED COURSE  Pertinent labs & imaging results that were available during my care of the patient were reviewed by me and considered in my medical decision making (see chart for details).  Patient took an unknown medication from his medicine cabinet, and developed symptoms of shaking and weakness which lasts about 3-4 hours but now resolved. His hemodynamics are normal. He does have a slight fever at triage which has resolved on recheck and only 10 minutes later. He denies any symptoms at this time and has a reassuring exam. Because he is diabetic we'll obtain a blood sugar, and because of his low-grade fever will also check urinalysis and basic blood work. Should these be unrevealing, I anticipate discharge to home with return precautions.  I discussed the patient and the family that we would obtain basic blood work because of the episode of shaking, and he is understandable with this. He presently has no symptoms, we discussed if he were to redevelop symptoms of shaking, weakness, fever nausea vomiting or other new concerns arise he should come back to emergency room for reevaluation. He and his wife and daughter are very agreeable with this.  ----------------------------------------- 11:17 PM on 10/21/2014 -----------------------------------------  Reviewed patient's labs. He is asymptomatic and feels well at this time. He does have a slight leukocytosis with left shift, but really no infectious symptoms. His exam is very reassuring, and on repeat he did not have a fever. I discussed with the patient and his family return precautions, and that his white blood cell count is elevated and this could be an early sign of infection. He is agreeable to follow up with Dr. Ouida Sills or return to the ER if he develops any new symptoms, his symptoms return, or other concerns  arise. ____________________________________________   FINAL CLINICAL IMPRESSION(S) / ED DIAGNOSES  Final diagnoses:  Medication overdose, accidental  or unintentional, initial encounter      Delman Kitten, MD 10/21/14 2318

## 2014-10-24 ENCOUNTER — Inpatient Hospital Stay: Payer: Medicare Other | Attending: Hematology and Oncology

## 2014-10-24 VITALS — BP 120/70 | HR 88 | Temp 96.8°F | Resp 16

## 2014-10-24 DIAGNOSIS — I129 Hypertensive chronic kidney disease with stage 1 through stage 4 chronic kidney disease, or unspecified chronic kidney disease: Secondary | ICD-10-CM | POA: Insufficient documentation

## 2014-10-24 DIAGNOSIS — C801 Malignant (primary) neoplasm, unspecified: Secondary | ICD-10-CM

## 2014-10-24 DIAGNOSIS — Z452 Encounter for adjustment and management of vascular access device: Secondary | ICD-10-CM | POA: Insufficient documentation

## 2014-10-24 DIAGNOSIS — C674 Malignant neoplasm of posterior wall of bladder: Secondary | ICD-10-CM | POA: Diagnosis present

## 2014-10-24 DIAGNOSIS — Z7982 Long term (current) use of aspirin: Secondary | ICD-10-CM | POA: Diagnosis not present

## 2014-10-24 DIAGNOSIS — E119 Type 2 diabetes mellitus without complications: Secondary | ICD-10-CM | POA: Diagnosis not present

## 2014-10-24 DIAGNOSIS — Z794 Long term (current) use of insulin: Secondary | ICD-10-CM | POA: Diagnosis not present

## 2014-10-24 DIAGNOSIS — Z79899 Other long term (current) drug therapy: Secondary | ICD-10-CM | POA: Insufficient documentation

## 2014-10-24 DIAGNOSIS — N189 Chronic kidney disease, unspecified: Secondary | ICD-10-CM | POA: Insufficient documentation

## 2014-10-24 DIAGNOSIS — Z8507 Personal history of malignant neoplasm of pancreas: Secondary | ICD-10-CM | POA: Insufficient documentation

## 2014-10-24 DIAGNOSIS — R1030 Lower abdominal pain, unspecified: Secondary | ICD-10-CM | POA: Diagnosis not present

## 2014-10-24 MED ORDER — SODIUM CHLORIDE 0.9 % IJ SOLN
10.0000 mL | Freq: Once | INTRAMUSCULAR | Status: AC
  Administered 2014-10-24: 10 mL via INTRAVENOUS
  Filled 2014-10-24: qty 10

## 2014-10-24 MED ORDER — HEPARIN SOD (PORK) LOCK FLUSH 100 UNIT/ML IV SOLN
500.0000 [IU] | Freq: Once | INTRAVENOUS | Status: AC
  Administered 2014-10-24: 500 [IU] via INTRAVENOUS
  Filled 2014-10-24: qty 5

## 2014-10-25 ENCOUNTER — Other Ambulatory Visit: Payer: PRIVATE HEALTH INSURANCE

## 2014-10-25 ENCOUNTER — Ambulatory Visit: Payer: PRIVATE HEALTH INSURANCE | Admitting: Oncology

## 2014-11-02 ENCOUNTER — Telehealth: Payer: Self-pay

## 2014-11-02 ENCOUNTER — Other Ambulatory Visit: Payer: Self-pay | Admitting: Hematology and Oncology

## 2014-11-02 ENCOUNTER — Inpatient Hospital Stay (HOSPITAL_BASED_OUTPATIENT_CLINIC_OR_DEPARTMENT_OTHER): Payer: Medicare Other | Admitting: Hematology and Oncology

## 2014-11-02 ENCOUNTER — Inpatient Hospital Stay: Payer: Medicare Other

## 2014-11-02 VITALS — BP 146/78 | HR 67 | Temp 96.1°F | Resp 17 | Ht 67.0 in | Wt 139.3 lb

## 2014-11-02 DIAGNOSIS — Z7982 Long term (current) use of aspirin: Secondary | ICD-10-CM | POA: Diagnosis not present

## 2014-11-02 DIAGNOSIS — N189 Chronic kidney disease, unspecified: Secondary | ICD-10-CM

## 2014-11-02 DIAGNOSIS — Z452 Encounter for adjustment and management of vascular access device: Secondary | ICD-10-CM | POA: Diagnosis not present

## 2014-11-02 DIAGNOSIS — D494 Neoplasm of unspecified behavior of bladder: Secondary | ICD-10-CM

## 2014-11-02 DIAGNOSIS — C674 Malignant neoplasm of posterior wall of bladder: Secondary | ICD-10-CM

## 2014-11-02 DIAGNOSIS — E119 Type 2 diabetes mellitus without complications: Secondary | ICD-10-CM

## 2014-11-02 DIAGNOSIS — D649 Anemia, unspecified: Secondary | ICD-10-CM | POA: Insufficient documentation

## 2014-11-02 DIAGNOSIS — R1030 Lower abdominal pain, unspecified: Secondary | ICD-10-CM

## 2014-11-02 DIAGNOSIS — Z79899 Other long term (current) drug therapy: Secondary | ICD-10-CM | POA: Diagnosis not present

## 2014-11-02 DIAGNOSIS — Z8507 Personal history of malignant neoplasm of pancreas: Secondary | ICD-10-CM

## 2014-11-02 DIAGNOSIS — I129 Hypertensive chronic kidney disease with stage 1 through stage 4 chronic kidney disease, or unspecified chronic kidney disease: Secondary | ICD-10-CM

## 2014-11-02 DIAGNOSIS — Z794 Long term (current) use of insulin: Secondary | ICD-10-CM

## 2014-11-02 LAB — CBC WITH DIFFERENTIAL/PLATELET
Basophils Absolute: 0.1 10*3/uL (ref 0–0.1)
Basophils Relative: 2 %
Eosinophils Absolute: 0 10*3/uL (ref 0–0.7)
Eosinophils Relative: 1 %
HCT: 37.4 % — ABNORMAL LOW (ref 40.0–52.0)
Hemoglobin: 12.1 g/dL — ABNORMAL LOW (ref 13.0–18.0)
Lymphocytes Relative: 9 %
Lymphs Abs: 0.5 10*3/uL — ABNORMAL LOW (ref 1.0–3.6)
MCH: 30.5 pg (ref 26.0–34.0)
MCHC: 32.3 g/dL (ref 32.0–36.0)
MCV: 94.5 fL (ref 80.0–100.0)
Monocytes Absolute: 0.3 10*3/uL (ref 0.2–1.0)
Monocytes Relative: 6 %
Neutro Abs: 4.4 10*3/uL (ref 1.4–6.5)
Neutrophils Relative %: 82 %
Platelets: 126 10*3/uL — ABNORMAL LOW (ref 150–440)
RBC: 3.95 MIL/uL — ABNORMAL LOW (ref 4.40–5.90)
RDW: 14.8 % — ABNORMAL HIGH (ref 11.5–14.5)
WBC: 5.4 10*3/uL (ref 3.8–10.6)

## 2014-11-02 LAB — COMPREHENSIVE METABOLIC PANEL
ALT: 14 U/L — ABNORMAL LOW (ref 17–63)
AST: 18 U/L (ref 15–41)
Albumin: 3.7 g/dL (ref 3.5–5.0)
Alkaline Phosphatase: 84 U/L (ref 38–126)
Anion gap: 4 — ABNORMAL LOW (ref 5–15)
BUN: 32 mg/dL — ABNORMAL HIGH (ref 6–20)
CO2: 27 mmol/L (ref 22–32)
Calcium: 8.7 mg/dL — ABNORMAL LOW (ref 8.9–10.3)
Chloride: 109 mmol/L (ref 101–111)
Creatinine, Ser: 1.68 mg/dL — ABNORMAL HIGH (ref 0.61–1.24)
GFR calc Af Amer: 41 mL/min — ABNORMAL LOW (ref >60)
GFR calc non Af Amer: 35 mL/min — ABNORMAL LOW (ref >60)
Glucose, Bld: 248 mg/dL — ABNORMAL HIGH (ref 65–99)
Potassium: 5.4 mmol/L — ABNORMAL HIGH (ref 3.5–5.1)
Sodium: 140 mmol/L (ref 135–145)
Total Bilirubin: 0.5 mg/dL (ref 0.3–1.2)
Total Protein: 6.1 g/dL — ABNORMAL LOW (ref 6.5–8.1)

## 2014-11-02 LAB — FERRITIN: Ferritin: 200 ng/mL (ref 24–336)

## 2014-11-02 LAB — POTASSIUM: Potassium: 4.6 mmol/L (ref 3.5–5.1)

## 2014-11-02 NOTE — Progress Notes (Signed)
[facility] day:  11/02/2014   Chief Complaint: Colton Tyler is an 81 y.o. male with bladder cancer who is seen for 3 month assessment.  HPI: The patient was last seen in the medical oncology clinic on 07/27/2014. At that time, he was doing well except for chronic soreness across the lower abdomen or pelvis which appears musculoskeletal.  Exam was unremarkable.  CBC included a hematocrit of 36.0, hemoglobin 11.7, MCV 95.7, platelets 107,000, white count 3800 with an ANC of 2900.  Creatinine was 1.78 with an estimated creatinine clearance of 33 ml/minute. Ferritin was 262.  Symptomatically, he is doing well.  His weight is stable.  He denies any pain.  He is unsure of his next follow-up with urology.  Past Medical History  Diagnosis Date  . Pancreatic cancer     s/p whipple procedure  . Diabetes   . Hypertension   . Bladder cancer   . Hematuria, gross 07/23/2014   Past Surgical History  Procedure Laterality Date  . Hernia repair    . Whipple procedure    . Bladder surgery    . Whipple procedure     Family History  Problem Relation Age of Onset  . Cerebrovascular Accident Father   . Stomach cancer Sister    Social History:  reports that he has never smoked. He does not have any smokeless tobacco history on file. He reports that he does not drink alcohol or use illicit drugs.  The patient is accompanied by his grand-daughter.  Allergies:  Allergies  Allergen Reactions  . Penicillins    Current Medications: Current Outpatient Prescriptions  Medication Sig Dispense Refill  . amLODipine (NORVASC) 10 MG tablet     . aspirin 81 MG tablet Take by mouth.    Marland Kitchen glimepiride (AMARYL) 2 MG tablet Take 2 mg by mouth daily with breakfast. Daily at 3:30    . insulin glargine (LANTUS) 100 UNIT/ML injection Inject into the skin at bedtime.    . Insulin Glargine (LANTUS) 100 UNIT/ML Solostar Pen Inject 20 Units into the skin daily after  breakfast.    . memantine (NAMENDA) 10 MG tablet Take 10 mg by mouth 2 (two) times daily.    Marland Kitchen omeprazole (PRILOSEC) 20 MG capsule     . ondansetron (ZOFRAN) 4 MG tablet Take 4 mg by mouth every 6 (six) hours as needed for nausea or vomiting.    . simvastatin (ZOCOR) 20 MG tablet     . sitaGLIPtin (JANUVIA) 25 MG tablet Take by mouth.    . valsartan (DIOVAN) 320 MG tablet      No current facility-administered medications for this visit.   Facility-Administered Medications Ordered in Other Visits  Medication Dose Route Frequency Provider Last Rate Last Dose  . sodium chloride 0.9 % injection 10 mL  10 mL Intravenous PRN Forest Gleason, MD   10 mL at 07/27/14 1136   Review of Systems:  GENERAL:  Doing well.  No fevers, sweats or weight loss.  Weight stable. PERFORMANCE STATUS (ECOG):  1 HEENT:  No visual changes, runny nose, sore throat, mouth sores or tenderness. Lungs: No shortness of breath or cough.  No hemoptysis. Cardiac:  No chest pain, palpitations, orthopnea, or PND. GI:  No nausea, vomiting, diarrhea, constipation, melena or hematochezia. GU:  No urgency, frequency, dysuria, or hematuria. Musculoskeletal:  No hip or back pain.  No joint pain.  No muscle tenderness. Extremities:  No pain or swelling. Skin:  No rashes or skin changes. Neuro:  No headache, numbness or weakness, balance or coordination issues. Endocrine:  No diabetes, thyroid issues, hot flashes or night sweats. Psych:  No mood changes, depression or anxiety. Pain:  No focal pain. Review of systems:  All other systems reviewed and found to be negative.   Physical Exam: Blood pressure 146/78, pulse 67, temperature 96.1 F (35.6 C), temperature source Tympanic, resp. rate 17, height _0  (1.702 m), weight 139 lb 5.3 oz (63.2 kg). GENERAL:  Well developed, well nourished, sitting comfortably in the exam room in no acute distress. MENTAL STATUS:  Alert and oriented to person, place and time. HEAD:  Pearline Cables hair.  Male  pattern baldness with sun induced changes to scalp.  Normocephalic, atraumatic, face symmetric, no Cushingoid features. EYES:  Blue eyes.  Pupils equal round and reactive to light and accomodation.  No conjunctivitis or scleral icterus. ENT:  Oropharynx clear without lesion.  Tongue normal. Mucous membranes moist.  RESPIRATORY:  Clear to auscultation without rales, wheezes or rhonchi. CARDIOVASCULAR:  Regular rate and rhythm without murmur, rub or gallop. ABDOMEN:  Soft, non-tender, with active bowel sounds, and no hepatosplenomegaly.  No masses. SKIN:  7 cm bruise left side of abdomen.  No rashes, ulcers or lesions. EXTREMITIES: No edema, no skin discoloration or tenderness.  No palpable cords. LYMPH NODES: No palpable cervical, supraclavicular, axillary or inguinal adenopathy  NEUROLOGICAL: Poor memory. PSYCH:  Appropriate.  Appointment on 11/02/2014  Component Date Value Ref Range Status  . WBC 11/02/2014 5.4  3.8 - 10.6 K/uL Final  . RBC 11/02/2014 3.95* 4.40 - 5.90 MIL/uL Final  . Hemoglobin 11/02/2014 12.1* 13.0 - 18.0 g/dL Final  . HCT 11/02/2014 37.4* 40.0 - 52.0 % Final  . MCV 11/02/2014 94.5  80.0 - 100.0 fL Final  . MCH 11/02/2014 30.5  26.0 - 34.0 pg Final  . MCHC 11/02/2014 32.3  32.0 - 36.0 g/dL Final  . RDW 11/02/2014 14.8* 11.5 - 14.5 % Final  . Platelets 11/02/2014 126* 150 - 440 K/uL Final  . Neutrophils Relative % 11/02/2014 82   Final  . Neutro Abs 11/02/2014 4.4  1.4 - 6.5 K/uL Final  . Lymphocytes Relative 11/02/2014 9   Final  . Lymphs Abs 11/02/2014 0.5* 1.0 - 3.6 K/uL Final  . Monocytes Relative 11/02/2014 6   Final  . Monocytes Absolute 11/02/2014 0.3  0.2 - 1.0 K/uL Final  . Eosinophils Relative 11/02/2014 1   Final  . Eosinophils Absolute 11/02/2014 0.0  0 - 0.7 K/uL Final  . Basophils Relative 11/02/2014 2   Final  . Basophils Absolute 11/02/2014 0.1  0 - 0.1 K/uL Final  . Sodium 11/02/2014 140  135 - 145 mmol/L Final  . Potassium 11/02/2014 5.4* 3.5 -  5.1 mmol/L Final  . Chloride 11/02/2014 109  101 - 111 mmol/L Final  . CO2 11/02/2014 27  22 - 32 mmol/L Final  . Glucose, Bld 11/02/2014 248* 65 - 99 mg/dL Final  . BUN 11/02/2014 32* 6 - 20 mg/dL Final  . Creatinine, Ser 11/02/2014 1.68* 0.61 - 1.24 mg/dL Final  . Calcium 11/02/2014 8.7* 8.9 - 10.3 mg/dL Final  . Total Protein 11/02/2014 6.1* 6.5 - 8.1 g/dL Final  . Albumin 11/02/2014 3.7  3.5 - 5.0 g/dL Final  . AST 11/02/2014 18  15 - 41 U/L Final  . ALT 11/02/2014 14* 17 - 63 U/L Final  . Alkaline Phosphatase 11/02/2014 84  38 - 126 U/L Final  .  Total Bilirubin 11/02/2014 0.5  0.3 - 1.2 mg/dL Final  . GFR calc non Af Amer 11/02/2014 35* >60 mL/min Final  . GFR calc Af Amer 11/02/2014 41* >60 mL/min Final   Comment: (NOTE) The eGFR has been calculated using the CKD EPI equation. This calculation has not been validated in all clinical situations. eGFR's persistently <60 mL/min signify possible Chronic Kidney Disease.   Georgiann Hahn gap 11/02/2014 4* 5 - 15 Final   Assessment:  ARNOLD KESTER is an 81 y.o. male with a history of clinical stage II (T2NxM0) bladder cancer.  He underwent a TURBT on 08/14/2013.  Pathology revealed a 3 cm high grade base of bladder tumor which invaded the muscularis propria.  There was lymphovascular invasion.   He received 7 weekly cycles of carboplatin (10/03/2013 - 11/21/2013) with radiation (10/17/2013 - 12/08/2013).  Chest, abdomen, and pelvic CT scan on 02/23/2014 revealed no evidence of metastic disease.  He underwent cystoscopy in 04/2014 per his wife (unknown results).  He may have undergone imaging studies.  He has a history of pancreatic cancer in the 1960s/1970s.  He underwent Whipple.  He has had no evidence of recurrent disease.  He was on oral iron TID for some time likely secondary to initial hematuria.  Iron was discontinued on 04/27/2014.  Diet is good.  His last colonoscopy was 4 years ago.  He denies any melena or hematochezia.  Iron stores  are good.  He has chronic kidney disease (creatinine clearance 35 ml/min).  Symptomatically, he feels well.  He denies any hematuria.  His memory is poor.  Plan: 1. Labs today:  CBC with diff, CMP, ferritin. 2. Obtain copy of cystoscopy report +/- scans from Dr Guinevere Ferrari office. 3. Chest, abdomen, and pelvic CT scan 4. RTC after CT scan.   Lequita Asal, MD  11/02/2014 , 11:28 AM

## 2014-11-02 NOTE — Telephone Encounter (Signed)
Called and spoke with pt that repeat potassium in normal range no special diet needed.  Pt verbalized an understanding

## 2014-11-02 NOTE — Progress Notes (Signed)
Pt reports no changes since last follow up

## 2014-11-09 ENCOUNTER — Ambulatory Visit
Admission: RE | Admit: 2014-11-09 | Discharge: 2014-11-09 | Disposition: A | Discharge status: Home / Self Care | Payer: Medicare Other | Source: Ambulatory Visit | Attending: Hematology and Oncology | Admitting: Hematology and Oncology

## 2014-11-09 DIAGNOSIS — D494 Neoplasm of unspecified behavior of bladder: Secondary | ICD-10-CM

## 2014-11-09 DIAGNOSIS — K5730 Diverticulosis of large intestine without perforation or abscess without bleeding: Secondary | ICD-10-CM | POA: Diagnosis not present

## 2014-11-09 DIAGNOSIS — R102 Pelvic and perineal pain: Secondary | ICD-10-CM | POA: Insufficient documentation

## 2014-11-09 DIAGNOSIS — C679 Malignant neoplasm of bladder, unspecified: Secondary | ICD-10-CM | POA: Diagnosis not present

## 2014-11-09 DIAGNOSIS — Z9221 Personal history of antineoplastic chemotherapy: Secondary | ICD-10-CM | POA: Insufficient documentation

## 2014-11-15 ENCOUNTER — Inpatient Hospital Stay: Payer: Medicare Other | Admitting: Hematology and Oncology

## 2014-11-26 ENCOUNTER — Ambulatory Visit: Payer: Medicare Other | Admitting: Hematology and Oncology

## 2014-11-29 ENCOUNTER — Ambulatory Visit: Payer: Medicare Other | Admitting: Hematology and Oncology

## 2014-12-04 ENCOUNTER — Inpatient Hospital Stay: Payer: Medicare Other | Attending: Hematology and Oncology | Admitting: Hematology and Oncology

## 2014-12-04 ENCOUNTER — Encounter: Payer: Self-pay | Admitting: Hematology and Oncology

## 2014-12-04 VITALS — BP 180/70 | Temp 96.9°F | Wt 140.0 lb

## 2014-12-04 DIAGNOSIS — Z794 Long term (current) use of insulin: Secondary | ICD-10-CM

## 2014-12-04 DIAGNOSIS — Z800 Family history of malignant neoplasm of digestive organs: Secondary | ICD-10-CM | POA: Diagnosis not present

## 2014-12-04 DIAGNOSIS — K5790 Diverticulosis of intestine, part unspecified, without perforation or abscess without bleeding: Secondary | ICD-10-CM | POA: Diagnosis not present

## 2014-12-04 DIAGNOSIS — C259 Malignant neoplasm of pancreas, unspecified: Secondary | ICD-10-CM

## 2014-12-04 DIAGNOSIS — Z7982 Long term (current) use of aspirin: Secondary | ICD-10-CM | POA: Insufficient documentation

## 2014-12-04 DIAGNOSIS — D649 Anemia, unspecified: Secondary | ICD-10-CM

## 2014-12-04 DIAGNOSIS — Z79899 Other long term (current) drug therapy: Secondary | ICD-10-CM | POA: Diagnosis not present

## 2014-12-04 DIAGNOSIS — N189 Chronic kidney disease, unspecified: Secondary | ICD-10-CM | POA: Insufficient documentation

## 2014-12-04 DIAGNOSIS — E119 Type 2 diabetes mellitus without complications: Secondary | ICD-10-CM | POA: Diagnosis not present

## 2014-12-04 DIAGNOSIS — D494 Neoplasm of unspecified behavior of bladder: Secondary | ICD-10-CM

## 2014-12-04 DIAGNOSIS — Z8507 Personal history of malignant neoplasm of pancreas: Secondary | ICD-10-CM | POA: Diagnosis not present

## 2014-12-04 DIAGNOSIS — I129 Hypertensive chronic kidney disease with stage 1 through stage 4 chronic kidney disease, or unspecified chronic kidney disease: Secondary | ICD-10-CM | POA: Diagnosis not present

## 2014-12-04 DIAGNOSIS — C679 Malignant neoplasm of bladder, unspecified: Secondary | ICD-10-CM | POA: Diagnosis not present

## 2014-12-04 MED ORDER — INFLUENZA VAC SPLIT QUAD 0.5 ML IM SUSY: 0.5000 mL | PREFILLED_SYRINGE | Freq: Once | INTRAMUSCULAR | Status: DC

## 2014-12-04 NOTE — Progress Notes (Signed)
[facility] day:  12/04/2014  Chief Complaint: Colton Tyler is an 81 y.o. male with bladder cancer who is seen for review of interval labs and CT scans.  HPI: The patient was last seen in the medical oncology clinic on 11/02/2014. At that time, he was doing well.  His weight was stable.  He denied any pain.  Exam was stable.  Hematocrit was 37.4, hemoglobin 12.1, MCV 94.5, platelets 126,000, white count 5400 with an ANC of 4400. Comprehensive metabolic panel included a creatinine of 1.68 (creatinine clearance 35 ml/minute).  Per prior plan, he underwent restaging CT scans.  Chest, abdomen, and pelvic CT scan on 11/09/2014 revealed no evidence of recurrent disease. He has diverticulosis.  He states that his diet is good.  He eats meat and a vegetable with every meal.  He does not take vitamins.  Symptomatically, he is doing well.  He denies any complaint.   Past Medical History  Diagnosis Date  . Pancreatic cancer     s/p whipple procedure  . Diabetes   . Hypertension   . Bladder cancer   . Hematuria, gross 07/23/2014   Past Surgical History  Procedure Laterality Date  . Hernia repair    . Whipple procedure    . Bladder surgery    . Whipple procedure     Family History  Problem Relation Age of Onset  . Cerebrovascular Accident Father   . Stomach cancer Sister    Social History:  reports that he has never smoked. He does not have any smokeless tobacco history on file. He reports that he does not drink alcohol or use illicit drugs.  The patient is accompanied by his wife.  Allergies:  Allergies  Allergen Reactions  . Penicillins    Current Medications: Current Outpatient Prescriptions  Medication Sig Dispense Refill  . amLODipine (NORVASC) 10 MG tablet     . aspirin 81 MG tablet Take by mouth.    Marland Kitchen glimepiride (AMARYL) 2 MG tablet Take 2 mg by mouth daily with breakfast. Daily at 3:30    . insulin glargine (LANTUS) 100  UNIT/ML injection Inject into the skin at bedtime.    . Insulin Glargine (LANTUS) 100 UNIT/ML Solostar Pen Inject 20 Units into the skin daily after breakfast.    . memantine (NAMENDA) 10 MG tablet Take 10 mg by mouth 2 (two) times daily.    Marland Kitchen omeprazole (PRILOSEC) 20 MG capsule     . ondansetron (ZOFRAN) 4 MG tablet Take 4 mg by mouth every 6 (six) hours as needed for nausea or vomiting.    . simvastatin (ZOCOR) 20 MG tablet     . sitaGLIPtin (JANUVIA) 25 MG tablet Take by mouth.    . valsartan (DIOVAN) 320 MG tablet      Current Facility-Administered Medications  Medication Dose Route Frequency Provider Last Rate Last Dose  . Influenza vac split quadrivalent PF (FLUARIX) injection 0.5 mL  0.5 mL Intramuscular Once Lequita Asal, MD       Facility-Administered Medications Ordered in Other Visits  Medication Dose Route Frequency Provider Last Rate Last Dose  . sodium chloride 0.9 % injection 10 mL  10 mL Intravenous PRN Forest Gleason, MD   10 mL at 07/27/14 1136   Review of Systems:  GENERAL:  Feels fine.  No fevers, sweats or weight loss.  Weight stable. PERFORMANCE STATUS (ECOG):  1 HEENT:  No visual changes, runny nose, sore throat, mouth sores or  tenderness. Lungs: No shortness of breath or cough.  No hemoptysis. Cardiac:  No chest pain, palpitations, orthopnea, or PND. GI:  No nausea, vomiting, diarrhea, constipation, melena or hematochezia. GU:  No urgency, frequency, dysuria, or hematuria. Musculoskeletal:  No hip or back pain.  No joint pain.  No muscle tenderness. Extremities:  No pain or swelling. Skin:  No rashes or skin changes. Neuro:  No headache, numbness or weakness, balance or coordination issues. Endocrine:  Diabetes.  No thyroid issues, hot flashes or night sweats. Psych:  No mood changes, depression or anxiety. Pain:  No focal pain. Review of systems:  All other systems reviewed and found to be negative.   Physical Exam: Blood pressure 180/70, temperature  96.9 F (36.1 C), temperature source Tympanic, weight 139 lb 15.9 oz (63.5 kg). GENERAL:  Well developed, well nourished, sitting comfortably in the exam room in no acute distress. MENTAL STATUS:  Alert and oriented to person, place and time. HEAD:  White hair.  Male pattern baldness with sun induced changes to scalp.  Normocephalic, atraumatic, face symmetric, no Cushingoid features. EYES:  Glasses.  Blue eyes.  No conjunctivitis or scleral icterus. NEUROLOGICAL: Poor memory. PSYCH:  Appropriate.  No visits with results within 3 Day(s) from this visit. Latest known visit with results is:  Appointment on 11/02/2014  Component Date Value Ref Range Status  . WBC 11/02/2014 5.4  3.8 - 10.6 K/uL Final  . RBC 11/02/2014 3.95* 4.40 - 5.90 MIL/uL Final  . Hemoglobin 11/02/2014 12.1* 13.0 - 18.0 g/dL Final  . HCT 11/02/2014 37.4* 40.0 - 52.0 % Final  . MCV 11/02/2014 94.5  80.0 - 100.0 fL Final  . MCH 11/02/2014 30.5  26.0 - 34.0 pg Final  . MCHC 11/02/2014 32.3  32.0 - 36.0 g/dL Final  . RDW 11/02/2014 14.8* 11.5 - 14.5 % Final  . Platelets 11/02/2014 126* 150 - 440 K/uL Final  . Neutrophils Relative % 11/02/2014 82   Final  . Neutro Abs 11/02/2014 4.4  1.4 - 6.5 K/uL Final  . Lymphocytes Relative 11/02/2014 9   Final  . Lymphs Abs 11/02/2014 0.5* 1.0 - 3.6 K/uL Final  . Monocytes Relative 11/02/2014 6   Final  . Monocytes Absolute 11/02/2014 0.3  0.2 - 1.0 K/uL Final  . Eosinophils Relative 11/02/2014 1   Final  . Eosinophils Absolute 11/02/2014 0.0  0 - 0.7 K/uL Final  . Basophils Relative 11/02/2014 2   Final  . Basophils Absolute 11/02/2014 0.1  0 - 0.1 K/uL Final  . Sodium 11/02/2014 140  135 - 145 mmol/L Final  . Potassium 11/02/2014 5.4* 3.5 - 5.1 mmol/L Final  . Chloride 11/02/2014 109  101 - 111 mmol/L Final  . CO2 11/02/2014 27  22 - 32 mmol/L Final  . Glucose, Bld 11/02/2014 248* 65 - 99 mg/dL Final  . BUN 11/02/2014 32* 6 - 20 mg/dL Final  . Creatinine, Ser 11/02/2014  1.68* 0.61 - 1.24 mg/dL Final  . Calcium 11/02/2014 8.7* 8.9 - 10.3 mg/dL Final  . Total Protein 11/02/2014 6.1* 6.5 - 8.1 g/dL Final  . Albumin 11/02/2014 3.7  3.5 - 5.0 g/dL Final  . AST 11/02/2014 18  15 - 41 U/L Final  . ALT 11/02/2014 14* 17 - 63 U/L Final  . Alkaline Phosphatase 11/02/2014 84  38 - 126 U/L Final  . Total Bilirubin 11/02/2014 0.5  0.3 - 1.2 mg/dL Final  . GFR calc non Af Amer 11/02/2014 35* >60 mL/min Final  . GFR  calc Af Amer 11/02/2014 41* >60 mL/min Final   Comment: (NOTE) The eGFR has been calculated using the CKD EPI equation. This calculation has not been validated in all clinical situations. eGFR's persistently <60 mL/min signify possible Chronic Kidney Disease.   . Anion gap 11/02/2014 4* 5 - 15 Final  . Ferritin 11/02/2014 200  24 - 336 ng/mL Final  . Potassium 11/02/2014 4.6  3.5 - 5.1 mmol/L Final   Assessment:  KAINON VARADY is an 81 y.o. male with a history of clinical stage II (T2NxM0) bladder cancer.  He underwent a TURBT on 08/14/2013.  Pathology revealed a 3 cm high grade base of bladder tumor which invaded the muscularis propria.  There was lymphovascular invasion.   He received 7 weekly cycles of carboplatin (10/03/2013 - 11/21/2013) with radiation (10/17/2013 - 12/08/2013).  Chest, abdomen, and pelvic CT scan on 02/23/2014 revealed no evidence of metastic disease.  He underwent cystoscopy in 04/2014 per his wife (unknown results).  He has a history of pancreatic cancer in the 1960s/1970s.  He underwent Whipple.  He has had no evidence of recurrent disease.  Chest, abdomen, and pelvic CT scan on 11/09/2014 revealed no evidence of recurrent disease. He has diverticulosis.  He was on oral iron TID for some time likely secondary to initial hematuria.  Iron was discontinued on 04/27/2014.  Diet is good.  His last colonoscopy was 4 years ago.  He denies any melena or hematochezia.  Iron stores are good.  He has chronic kidney disease (creatinine  clearance 35 ml/min).  Symptomatically, he feels well.  He denies any hematuria.  His memory is poor.  Plan: 1. Review CT scans. 2. Obtain copy of cystoscopy report +/- scans from Dr Guinevere Ferrari office. 3. RTC in 3 months for MD assessment and labs (CBC with diff, B12, folate, SPEP, FLCA).   Lequita Asal, MD  12/04/2014 , 3:11 PM

## 2015-03-05 ENCOUNTER — Inpatient Hospital Stay (HOSPITAL_BASED_OUTPATIENT_CLINIC_OR_DEPARTMENT_OTHER): Payer: Medicare Other | Admitting: Hematology and Oncology

## 2015-03-05 ENCOUNTER — Inpatient Hospital Stay: Payer: Medicare Other | Attending: Hematology and Oncology

## 2015-03-05 ENCOUNTER — Encounter: Payer: Self-pay | Admitting: Hematology and Oncology

## 2015-03-05 VITALS — BP 182/79 | HR 69 | Temp 95.6°F | Resp 18 | Ht 67.0 in | Wt 142.0 lb

## 2015-03-05 DIAGNOSIS — Z8551 Personal history of malignant neoplasm of bladder: Secondary | ICD-10-CM

## 2015-03-05 DIAGNOSIS — Z794 Long term (current) use of insulin: Secondary | ICD-10-CM | POA: Insufficient documentation

## 2015-03-05 DIAGNOSIS — Z8507 Personal history of malignant neoplasm of pancreas: Secondary | ICD-10-CM | POA: Insufficient documentation

## 2015-03-05 DIAGNOSIS — N189 Chronic kidney disease, unspecified: Secondary | ICD-10-CM | POA: Diagnosis not present

## 2015-03-05 DIAGNOSIS — D494 Neoplasm of unspecified behavior of bladder: Secondary | ICD-10-CM

## 2015-03-05 DIAGNOSIS — K5790 Diverticulosis of intestine, part unspecified, without perforation or abscess without bleeding: Secondary | ICD-10-CM

## 2015-03-05 DIAGNOSIS — Z800 Family history of malignant neoplasm of digestive organs: Secondary | ICD-10-CM | POA: Insufficient documentation

## 2015-03-05 DIAGNOSIS — I10 Essential (primary) hypertension: Secondary | ICD-10-CM

## 2015-03-05 DIAGNOSIS — E119 Type 2 diabetes mellitus without complications: Secondary | ICD-10-CM

## 2015-03-05 DIAGNOSIS — D696 Thrombocytopenia, unspecified: Secondary | ICD-10-CM | POA: Diagnosis not present

## 2015-03-05 DIAGNOSIS — Z79899 Other long term (current) drug therapy: Secondary | ICD-10-CM | POA: Diagnosis not present

## 2015-03-05 DIAGNOSIS — D649 Anemia, unspecified: Secondary | ICD-10-CM

## 2015-03-05 DIAGNOSIS — I129 Hypertensive chronic kidney disease with stage 1 through stage 4 chronic kidney disease, or unspecified chronic kidney disease: Secondary | ICD-10-CM

## 2015-03-05 DIAGNOSIS — Z7982 Long term (current) use of aspirin: Secondary | ICD-10-CM | POA: Insufficient documentation

## 2015-03-05 DIAGNOSIS — Z8589 Personal history of malignant neoplasm of other organs and systems: Secondary | ICD-10-CM | POA: Insufficient documentation

## 2015-03-05 LAB — CBC WITH DIFFERENTIAL/PLATELET
Basophils Absolute: 0 10*3/uL (ref 0–0.1)
Basophils Relative: 1 %
Eosinophils Absolute: 0.1 10*3/uL (ref 0–0.7)
Eosinophils Relative: 1 %
HCT: 38.2 % — ABNORMAL LOW (ref 40.0–52.0)
Hemoglobin: 12.4 g/dL — ABNORMAL LOW (ref 13.0–18.0)
Lymphocytes Relative: 9 %
Lymphs Abs: 0.6 10*3/uL — ABNORMAL LOW (ref 1.0–3.6)
MCH: 30 pg (ref 26.0–34.0)
MCHC: 32.4 g/dL (ref 32.0–36.0)
MCV: 92.5 fL (ref 80.0–100.0)
Monocytes Absolute: 0.5 10*3/uL (ref 0.2–1.0)
Monocytes Relative: 8 %
Neutro Abs: 5.1 10*3/uL (ref 1.4–6.5)
Neutrophils Relative %: 81 %
Platelets: 114 10*3/uL — ABNORMAL LOW (ref 150–440)
RBC: 4.13 MIL/uL — ABNORMAL LOW (ref 4.40–5.90)
RDW: 14.5 % (ref 11.5–14.5)
WBC: 6.2 10*3/uL (ref 3.8–10.6)

## 2015-03-05 LAB — FERRITIN: Ferritin: 138 ng/mL (ref 24–336)

## 2015-03-05 LAB — IRON AND TIBC
Iron: 68 ug/dL (ref 45–182)
Saturation Ratios: 24 % (ref 17.9–39.5)
TIBC: 279 ug/dL (ref 250–450)
UIBC: 211 ug/dL

## 2015-03-05 LAB — FOLATE: Folate: 38 ng/mL (ref >5.9)

## 2015-03-05 NOTE — Progress Notes (Signed)
[facility] day:  03/05/2015   Chief Complaint: Colton Tyler is an 81 y.o. male with bladder cancer who is seen for 3 month assessment.  HPI: The patient was last seen in the medical oncology clinic on 12/04/2014. At that time, restaging chest, abdomen, and pelvic CT scan from 11/09/2014 was reviewed.  There was no evidence of recurrent disease. He had diverticulosis.  Prior CBC from 11/02/2014 revealed a hematocrit of 37.4, hemoglobin 12.1, MCV 94.5, platelets 126,000, and WBC 5400 with a normal differential.  Ferritin was 200.  Creatinine was 1.68 with a CrCl of 35 ml/min.  During the interim, he denies any new problems.  He is on ne new medications or herbal products.  His daughter puts out his medications.  He does not drink quinine water.  Past Medical History  Diagnosis Date  . Pancreatic cancer [facility])     s/p whipple procedure  . Diabetes ([place])   . Hypertension   . Bladder cancer ([place])   . Hematuria, gross 07/23/2014   Past Surgical History  Procedure Laterality Date  . Hernia repair    . Whipple procedure    . Bladder surgery    . Whipple procedure     Family History  Problem Relation Age of Onset  . Cerebrovascular Accident Father   . Stomach cancer Sister    Social History:  reports that he has never smoked. He does not have any smokeless tobacco history on file. He reports that he does not drink alcohol or use illicit drugs.  The patient is accompanied by his daughter, Colton Tyler.  Allergies:  Allergies  Allergen Reactions  . Penicillins    Current Medications: Current Outpatient Prescriptions  Medication Sig Dispense Refill  . amLODipine (NORVASC) 10 MG tablet     . amLODipine (NORVASC) 10 MG tablet Take by mouth.    Marland Kitchen aspirin (ASPIR-LOW) 81 MG EC tablet     . aspirin 81 MG tablet Take by mouth.    Marland Kitchen glimepiride (AMARYL) 2 MG tablet Take 2 mg by mouth daily with breakfast. Daily at 3:30    . glucose blood (TRUETEST  TEST) test strip     . Insulin Glargine (LANTUS SOLOSTAR) 100 UNIT/ML Solostar Pen Inject into the skin.    Marland Kitchen insulin glargine (LANTUS) 100 UNIT/ML injection Inject into the skin at bedtime.    . Insulin Glargine (LANTUS) 100 UNIT/ML Solostar Pen Inject 20 Units into the skin daily after breakfast.    . Insulin Pen Needle (B-D UF III MINI PEN NEEDLES) 31G X 5 MM MISC     . memantine (NAMENDA) 10 MG tablet Take 10 mg by mouth 2 (two) times daily.    . memantine (NAMENDA) 10 MG tablet Take by mouth.    Marland Kitchen omeprazole (PRILOSEC) 20 MG capsule     . ondansetron (ZOFRAN) 4 MG tablet Take 4 mg by mouth every 6 (six) hours as needed for nausea or vomiting.    . simvastatin (ZOCOR) 20 MG tablet     . simvastatin (ZOCOR) 20 MG tablet Take by mouth.    . sitaGLIPtin (JANUVIA) 25 MG tablet Take by mouth.     No current facility-administered medications for this visit.   Facility-Administered Medications Ordered in Other Visits  Medication Dose Route Frequency Provider Last Rate Last Dose  . Influenza vac split quadrivalent PF (FLUARIX) injection 0.5 mL  0.5 mL Intramuscular Once Lequita Asal, MD      .  sodium chloride 0.9 % injection 10 mL  10 mL Intravenous PRN Forest Gleason, MD   10 mL at 07/27/14 1136   Review of Systems:  GENERAL:  Feels fine.  No fevers, sweats or weight loss.  Weight stable. PERFORMANCE STATUS (ECOG):  1 HEENT:  No visual changes, runny nose, sore throat, mouth sores or tenderness. Lungs: No shortness of breath or cough.  No hemoptysis. Cardiac:  No chest pain, palpitations, orthopnea, or PND. GI:  No nausea, vomiting, diarrhea, constipation, melena or hematochezia. GU:  No urgency, frequency, dysuria, or hematuria. Musculoskeletal:  No bone pain.  No joint pain.  No muscle tenderness. Extremities:  No pain or swelling. Skin:  No rashes or skin changes.  No bruising or bleeding. Neuro:  No headache, numbness or weakness, balance or coordination issues. Endocrine:   Diabetes.  No thyroid issues, hot flashes or night sweats. Psych:  No mood changes, depression or anxiety. Pain:  No focal pain. Review of systems:  All other systems reviewed and found to be negative.   Physical Exam: Blood pressure 182/79, pulse 69, temperature 95.6 F (35.3 C), temperature source Tympanic, resp. rate 18, height 5\' 7"  (1.702 m), weight 141 lb 15.6 oz (64.4 kg). GENERAL:  Well developed, well nourished, sitting comfortably in the exam room in no acute distress. MENTAL STATUS:  Alert and oriented to person, place and time. HEAD:  White hair.  Male pattern baldness with sun induced changes to scalp.  Normocephalic, atraumatic, face symmetric, no Cushingoid features. EYES:  Glasses.  Blue eyes.  Pupils equal round and reactive to light and accomodation.  No conjunctivitis or scleral icterus. ENT:  Oropharynx clear without lesion.  Tongue normal. Mucous membranes moist.  RESPIRATORY:  Clear to auscultation without rales, wheezes or rhonchi. CARDIOVASCULAR:  Regular rate and rhythm without murmur, rub or gallop. ABDOMEN:  Soft, non-tender, with active bowel sounds, and no hepatosplenomegaly.  No masses. SKIN:  No rashes, ulcers or lesions. EXTREMITIES: No edema, no skin discoloration or tenderness.  No palpable cords. LYMPH NODES: No palpable cervical, supraclavicular, axillary or inguinal adenopathy  NEUROLOGICAL: Unremarkable. PSYCH:  Appropriate.   Appointment on 03/05/2015  Component Date Value Ref Range Status  . WBC 03/05/2015 6.2  3.8 - 10.6 K/uL Final  . RBC 03/05/2015 4.13* 4.40 - 5.90 MIL/uL Final  . Hemoglobin 03/05/2015 12.4* 13.0 - 18.0 g/dL Final  . HCT 03/05/2015 38.2* 40.0 - 52.0 % Final  . MCV 03/05/2015 92.5  80.0 - 100.0 fL Final  . MCH 03/05/2015 30.0  26.0 - 34.0 pg Final  . MCHC 03/05/2015 32.4  32.0 - 36.0 g/dL Final  . RDW 03/05/2015 14.5  11.5 - 14.5 % Final  . Platelets 03/05/2015 114* 150 - 440 K/uL Final  . Neutrophils Relative % 03/05/2015 81    Final  . Neutro Abs 03/05/2015 5.1  1.4 - 6.5 K/uL Final  . Lymphocytes Relative 03/05/2015 9   Final  . Lymphs Abs 03/05/2015 0.6* 1.0 - 3.6 K/uL Final  . Monocytes Relative 03/05/2015 8   Final  . Monocytes Absolute 03/05/2015 0.5  0.2 - 1.0 K/uL Final  . Eosinophils Relative 03/05/2015 1   Final  . Eosinophils Absolute 03/05/2015 0.1  0 - 0.7 K/uL Final  . Basophils Relative 03/05/2015 1   Final  . Basophils Absolute 03/05/2015 0.0  0 - 0.1 K/uL Final   Assessment:  AIDRIK KELSOE is an 81 y.o. male with a history of clinical stage II (T2NxM0) bladder cancer.  He underwent a TURBT on 08/14/2013.  Pathology revealed a 3 cm high grade base of bladder tumor which invaded the muscularis propria.  There was lymphovascular invasion.   He received 7 weekly cycles of carboplatin (10/03/2013 - 11/21/2013) with radiation (10/17/2013 - 12/08/2013).  Chest, abdomen, and pelvic CT scan on 02/23/2014 revealed no evidence of metastic disease.  He underwent cystoscopy in 04/2014 per his wife (unknown results).  He has a history of pancreatic cancer in the 1960s/1970s.  He underwent Whipple.  He has had no evidence of recurrent disease.  Chest, abdomen, and pelvic CT scan on 11/09/2014 revealed no evidence of recurrent disease. He has diverticulosis.  He was on oral iron TID for some time likely secondary to initial hematuria.  Iron was discontinued on 04/27/2014.  Diet is good.  His last colonoscopy was 4 years ago.  He denies any melena or hematochezia.  Iron stores are good.  He has chronic kidney disease (creatinine clearance 35 ml/min).  He has mild thrombocytopenia since 07/2013 of unclear etiology.  Platelet count has ranged from 65,000 - 134,000 without trend.  He is on no new medications or herbal products.  He denies any bruising or bleeding.  Symptomatically, he feels good.  He denies any hematuria.  His memory is poor.  Exam is unremarkable.  Hematocrit is improving.  Platelet count is  114,000.  Plan: 1. Labs today:  CBC with diff, B12, folate, ferritin, iron studies, SPEP, FLCA. 2. Obtain copy of last cystoscopy report +/- scans from Dr Guinevere Ferrari office. 3. Port flush every 6-8 weeks (due now). 4. RTC in 3 months for labs (CBC with diff). 5. RTC in 6 months for MD assessment and labs (CBC with diff, CMP, +/- others).   Lequita Asal, MD  03/05/2015, 4:41 PM

## 2015-03-06 ENCOUNTER — Other Ambulatory Visit: Payer: Self-pay

## 2015-03-06 DIAGNOSIS — N3289 Other specified disorders of bladder: Secondary | ICD-10-CM

## 2015-03-06 LAB — PROTEIN ELECTROPHORESIS, SERUM
A/G Ratio: 1.5 (ref 0.7–1.7)
Albumin ELP: 3.3 g/dL (ref 2.9–4.4)
Alpha-1-Globulin: 0.2 g/dL (ref 0.0–0.4)
Alpha-2-Globulin: 0.7 g/dL (ref 0.4–1.0)
Beta Globulin: 0.8 g/dL (ref 0.7–1.3)
Gamma Globulin: 0.5 g/dL (ref 0.4–1.8)
Globulin, Total: 2.2 g/dL (ref 2.2–3.9)
Total Protein ELP: 5.5 g/dL — ABNORMAL LOW (ref 6.0–8.5)

## 2015-03-06 LAB — VITAMIN B12: Vitamin B-12: 528 pg/mL (ref 180–914)

## 2015-03-06 LAB — KAPPA/LAMBDA LIGHT CHAINS
Kappa free light chain: 26.42 mg/L — ABNORMAL HIGH (ref 3.30–19.40)
Kappa, lambda light chain ratio: 1.09 (ref 0.26–1.65)
Lambda free light chains: 24.31 mg/L (ref 5.71–26.30)

## 2015-03-08 ENCOUNTER — Inpatient Hospital Stay: Payer: Medicare Other

## 2015-03-08 DIAGNOSIS — Z8551 Personal history of malignant neoplasm of bladder: Secondary | ICD-10-CM | POA: Diagnosis not present

## 2015-03-08 DIAGNOSIS — C259 Malignant neoplasm of pancreas, unspecified: Secondary | ICD-10-CM

## 2015-03-08 MED ORDER — HEPARIN SOD (PORK) LOCK FLUSH 100 UNIT/ML IV SOLN
500.0000 [IU] | Freq: Once | INTRAVENOUS | Status: AC
  Administered 2015-03-08: 500 [IU] via INTRAVENOUS
  Filled 2015-03-08: qty 5

## 2015-03-08 MED ORDER — SODIUM CHLORIDE 0.9 % IJ SOLN
10.0000 mL | Freq: Once | INTRAMUSCULAR | Status: AC
  Administered 2015-03-08: 10 mL via INTRAVENOUS
  Filled 2015-03-08: qty 10

## 2015-03-24 ENCOUNTER — Encounter: Payer: Self-pay | Admitting: Hematology and Oncology

## 2015-04-22 ENCOUNTER — Inpatient Hospital Stay: Payer: Medicare Other

## 2015-04-26 ENCOUNTER — Inpatient Hospital Stay: Payer: Medicare Other | Attending: Hematology and Oncology

## 2015-04-26 DIAGNOSIS — C679 Malignant neoplasm of bladder, unspecified: Secondary | ICD-10-CM | POA: Insufficient documentation

## 2015-04-26 DIAGNOSIS — Z452 Encounter for adjustment and management of vascular access device: Secondary | ICD-10-CM | POA: Insufficient documentation

## 2015-04-26 DIAGNOSIS — Z95828 Presence of other vascular implants and grafts: Secondary | ICD-10-CM

## 2015-04-26 MED ORDER — SODIUM CHLORIDE 0.9% FLUSH
10.0000 mL | INTRAVENOUS | Status: DC | PRN
  Administered 2015-04-26: 10 mL via INTRAVENOUS
  Filled 2015-04-26: qty 10

## 2015-04-26 MED ORDER — HEPARIN SOD (PORK) LOCK FLUSH 100 UNIT/ML IV SOLN
500.0000 [IU] | Freq: Once | INTRAVENOUS | Status: AC
  Administered 2015-04-26: 500 [IU] via INTRAVENOUS

## 2015-05-25 ENCOUNTER — Other Ambulatory Visit: Payer: Self-pay | Admitting: Hematology and Oncology

## 2015-05-31 ENCOUNTER — Inpatient Hospital Stay: Payer: Medicare Other | Attending: Hematology and Oncology

## 2015-05-31 ENCOUNTER — Inpatient Hospital Stay: Payer: Medicare Other

## 2015-05-31 DIAGNOSIS — C259 Malignant neoplasm of pancreas, unspecified: Secondary | ICD-10-CM | POA: Insufficient documentation

## 2015-05-31 DIAGNOSIS — Z95828 Presence of other vascular implants and grafts: Secondary | ICD-10-CM

## 2015-05-31 DIAGNOSIS — N3289 Other specified disorders of bladder: Secondary | ICD-10-CM

## 2015-05-31 DIAGNOSIS — Z79899 Other long term (current) drug therapy: Secondary | ICD-10-CM | POA: Diagnosis not present

## 2015-05-31 LAB — CBC WITH DIFFERENTIAL/PLATELET
Basophils Absolute: 0 10*3/uL (ref 0–0.1)
Basophils Relative: 1 %
Eosinophils Absolute: 0.1 10*3/uL (ref 0–0.7)
Eosinophils Relative: 2 %
HCT: 37.7 % — ABNORMAL LOW (ref 40.0–52.0)
Hemoglobin: 12.7 g/dL — ABNORMAL LOW (ref 13.0–18.0)
Lymphocytes Relative: 14 %
Lymphs Abs: 0.7 10*3/uL — ABNORMAL LOW (ref 1.0–3.6)
MCH: 30.5 pg (ref 26.0–34.0)
MCHC: 33.6 g/dL (ref 32.0–36.0)
MCV: 90.9 fL (ref 80.0–100.0)
Monocytes Absolute: 0.6 10*3/uL (ref 0.2–1.0)
Monocytes Relative: 11 %
Neutro Abs: 3.7 10*3/uL (ref 1.4–6.5)
Neutrophils Relative %: 72 %
Platelets: 103 10*3/uL — ABNORMAL LOW (ref 150–440)
RBC: 4.15 MIL/uL — ABNORMAL LOW (ref 4.40–5.90)
RDW: 14.5 % (ref 11.5–14.5)
WBC: 5.2 10*3/uL (ref 3.8–10.6)

## 2015-05-31 MED ORDER — HEPARIN SOD (PORK) LOCK FLUSH 100 UNIT/ML IV SOLN
500.0000 [IU] | Freq: Once | INTRAVENOUS | Status: AC
  Administered 2015-05-31: 500 [IU] via INTRAVENOUS

## 2015-05-31 MED ORDER — SODIUM CHLORIDE 0.9% FLUSH
10.0000 mL | INTRAVENOUS | Status: DC | PRN
  Administered 2015-05-31: 10 mL via INTRAVENOUS
  Filled 2015-05-31: qty 10

## 2015-07-12 ENCOUNTER — Inpatient Hospital Stay: Payer: Medicare Other | Attending: Hematology and Oncology

## 2015-07-12 DIAGNOSIS — C259 Malignant neoplasm of pancreas, unspecified: Secondary | ICD-10-CM

## 2015-07-12 DIAGNOSIS — Z452 Encounter for adjustment and management of vascular access device: Secondary | ICD-10-CM | POA: Insufficient documentation

## 2015-07-12 MED ORDER — SODIUM CHLORIDE 0.9% FLUSH
10.0000 mL | Freq: Once | INTRAVENOUS | Status: AC
  Administered 2015-07-12: 10 mL via INTRAVENOUS
  Filled 2015-07-12: qty 10

## 2015-07-12 MED ORDER — HEPARIN SOD (PORK) LOCK FLUSH 100 UNIT/ML IV SOLN
500.0000 [IU] | Freq: Once | INTRAVENOUS | Status: AC
  Administered 2015-07-12: 500 [IU] via INTRAVENOUS
  Filled 2015-07-12: qty 5

## 2015-08-09 ENCOUNTER — Ambulatory Visit: Payer: Medicare Other | Admitting: Urology

## 2015-08-30 ENCOUNTER — Other Ambulatory Visit: Payer: Medicare Other

## 2015-08-30 ENCOUNTER — Ambulatory Visit: Payer: Medicare Other | Admitting: Hematology and Oncology

## 2015-09-06 ENCOUNTER — Inpatient Hospital Stay: Payer: Medicare Other | Attending: Hematology and Oncology

## 2015-09-06 ENCOUNTER — Inpatient Hospital Stay: Payer: Medicare Other | Admitting: Hematology and Oncology

## 2015-09-06 ENCOUNTER — Inpatient Hospital Stay: Payer: Medicare Other

## 2015-09-13 ENCOUNTER — Other Ambulatory Visit: Payer: Medicare Other | Admitting: Urology

## 2015-09-16 ENCOUNTER — Inpatient Hospital Stay: Payer: Medicare Other | Attending: Hematology and Oncology

## 2015-09-16 ENCOUNTER — Inpatient Hospital Stay: Payer: Medicare Other

## 2015-09-16 DIAGNOSIS — N3289 Other specified disorders of bladder: Secondary | ICD-10-CM

## 2015-09-16 DIAGNOSIS — C259 Malignant neoplasm of pancreas, unspecified: Secondary | ICD-10-CM | POA: Diagnosis present

## 2015-09-16 LAB — COMPREHENSIVE METABOLIC PANEL
ALT: 28 U/L (ref 17–63)
AST: 28 U/L (ref 15–41)
Albumin: 4 g/dL (ref 3.5–5.0)
Alkaline Phosphatase: 108 U/L (ref 38–126)
Anion gap: 5 (ref 5–15)
BUN: 27 mg/dL — ABNORMAL HIGH (ref 6–20)
CO2: 26 mmol/L (ref 22–32)
Calcium: 8.9 mg/dL (ref 8.9–10.3)
Chloride: 103 mmol/L (ref 101–111)
Creatinine, Ser: 1.42 mg/dL — ABNORMAL HIGH (ref 0.61–1.24)
GFR calc Af Amer: 50 mL/min — ABNORMAL LOW (ref >60)
GFR calc non Af Amer: 43 mL/min — ABNORMAL LOW (ref >60)
Glucose, Bld: 320 mg/dL — ABNORMAL HIGH (ref 65–99)
Potassium: 4.1 mmol/L (ref 3.5–5.1)
Sodium: 134 mmol/L — ABNORMAL LOW (ref 135–145)
Total Bilirubin: 0.6 mg/dL (ref 0.3–1.2)
Total Protein: 6.3 g/dL — ABNORMAL LOW (ref 6.5–8.1)

## 2015-09-16 LAB — CBC WITH DIFFERENTIAL/PLATELET
Basophils Absolute: 0 10*3/uL (ref 0–0.1)
Basophils Relative: 1 %
Eosinophils Absolute: 0 10*3/uL (ref 0–0.7)
Eosinophils Relative: 0 %
HCT: 36.7 % — ABNORMAL LOW (ref 40.0–52.0)
Hemoglobin: 12.1 g/dL — ABNORMAL LOW (ref 13.0–18.0)
Lymphocytes Relative: 11 %
Lymphs Abs: 0.7 10*3/uL — ABNORMAL LOW (ref 1.0–3.6)
MCH: 29.9 pg (ref 26.0–34.0)
MCHC: 32.9 g/dL (ref 32.0–36.0)
MCV: 91 fL (ref 80.0–100.0)
Monocytes Absolute: 0.6 10*3/uL (ref 0.2–1.0)
Monocytes Relative: 9 %
Neutro Abs: 4.7 10*3/uL (ref 1.4–6.5)
Neutrophils Relative %: 79 %
Platelets: 127 10*3/uL — ABNORMAL LOW (ref 150–440)
RBC: 4.04 MIL/uL — ABNORMAL LOW (ref 4.40–5.90)
RDW: 14.8 % — ABNORMAL HIGH (ref 11.5–14.5)
WBC: 6 10*3/uL (ref 3.8–10.6)

## 2015-09-16 MED ORDER — SODIUM CHLORIDE 0.9% FLUSH
10.0000 mL | Freq: Once | INTRAVENOUS | Status: AC
  Administered 2015-09-16: 10 mL via INTRAVENOUS
  Filled 2015-09-16: qty 10

## 2015-09-16 MED ORDER — HEPARIN SOD (PORK) LOCK FLUSH 100 UNIT/ML IV SOLN
500.0000 [IU] | Freq: Once | INTRAVENOUS | Status: AC
  Administered 2015-09-16: 500 [IU] via INTRAVENOUS
  Filled 2015-09-16: qty 5

## 2015-10-07 ENCOUNTER — Ambulatory Visit (INDEPENDENT_AMBULATORY_CARE_PROVIDER_SITE_OTHER): Payer: Medicare Other | Admitting: Urology

## 2015-10-07 ENCOUNTER — Encounter: Payer: Self-pay | Admitting: Urology

## 2015-10-07 VITALS — BP 160/75 | HR 57 | Ht 66.0 in | Wt 131.7 lb

## 2015-10-07 DIAGNOSIS — D494 Neoplasm of unspecified behavior of bladder: Secondary | ICD-10-CM

## 2015-10-07 LAB — URINALYSIS, COMPLETE
BILIRUBIN UA: NEGATIVE
KETONES UA: NEGATIVE
Leukocytes, UA: NEGATIVE
Nitrite, UA: NEGATIVE
Protein, UA: NEGATIVE
RBC UA: NEGATIVE
SPEC GRAV UA: 1.015 (ref 1.005–1.030)
Urobilinogen, Ur: 0.2 mg/dL (ref 0.2–1.0)
pH, UA: 6 (ref 5.0–7.5)

## 2015-10-07 LAB — MICROSCOPIC EXAMINATION: Bacteria, UA: NONE SEEN

## 2015-10-07 MED ORDER — CIPROFLOXACIN HCL 500 MG PO TABS
500.0000 mg | ORAL_TABLET | Freq: Once | ORAL | Status: AC
  Administered 2015-10-07: 500 mg via ORAL

## 2015-10-07 MED ORDER — LIDOCAINE HCL 2 % EX GEL
1.0000 "application " | Freq: Once | CUTANEOUS | Status: AC
  Administered 2015-10-07: 1 via URETHRAL

## 2015-10-07 NOTE — Progress Notes (Signed)
   History of Present Illness: Patient returns with a history of T2 urothelial carcinoma at the bladder neck resected September 2015 treated with carboplatin chemotherapy and concurrent radiation. He was seen June 2016 with a negative cystoscopy. No f/u since. Last CT scan was September 2016.  Patient reports no voiding complaints. He denies tissue area or gross hematuria.  Past Medical History:  Diagnosis Date  . Bladder cancer ([place])   . Diabetes ([place])   . Hematuria, gross 07/23/2014  . Hypertension   . Pancreatic cancer [facility])    s/p whipple procedure   Past Surgical History:  Procedure Laterality Date  . BLADDER SURGERY    . HERNIA REPAIR    . WHIPPLE PROCEDURE    . WHIPPLE PROCEDURE      Home Medications:   (Not in a hospital admission) Allergies:  Allergies  Allergen Reactions  . Penicillins     Family History  Problem Relation Age of Onset  . Cerebrovascular Accident Father   . Stomach cancer Sister    Social History:  reports that he has never smoked. He does not have any smokeless tobacco history on file. He reports that he does not drink alcohol or use drugs.  ROS: A complete review of systems was performed.  All systems are negative except for pertinent findings as noted. ROS   Physical Exam:  Vital signs in last 24 hours: @VSRANGES @ General:  Alert and oriented, No acute distress Cardiovascular: Regular rate and rhythm Lungs: Regular rate and effort Abdomen: Soft, nontender, nondistended, no abdominal masses Extremities: No edema Neurologic: Grossly intact  Procedure: After consent was obtained the penis was prepped and the patient draped in usual sterile fashion. The cystoscope was passed per urethra and the bladder inspected. He tolerated the procedure well. Findings: The urethra was unremarkable. The prostate was short and nonobstructive. The trigone and ureteral orifices were in their normal orthotopic position. The bladder neck appeared clear.  There was mild trabeculation. In the posterior left bladder there was a conspicuous area of erythema concerning for CIS. There were no papillary tumors.   Laboratory Data:  No results found for this or any previous visit (from the past 24 hour(s)). No results found for this or any previous visit (from the past 240 hour(s)). Creatinine: No results for input(s): CREATININE in the last 168 hours.  Impression/Assessment/plan:  Bladder neoplasm/erythema-patient with a history of T2 disease at the bladder neck and this appears clear. There is a conspicuous area in the posterior left bladder concerning for CIS. Discussed with the patient, his wife and daughter the nature risk benefits and alternatives to cystoscopy with bladder biopsy and fulguration. Also set him up for metastatic and upper tract imaging evaluation. If he ends up with a noncontrast CT, retrogrades can be considered in the OR. His recent GFR was around 50. I also sent urine for cytology.  Colton Tyler 10/07/2015, 10:10 AM

## 2015-10-08 ENCOUNTER — Telehealth: Payer: Self-pay | Admitting: Radiology

## 2015-10-08 NOTE — Telephone Encounter (Signed)
Notified pt's daughter, [name], of surgery scheduled 10/21/15, pre-admit testing appt on 10/10/15 @9 :72 & to call Friday prior to surgery for arrival time to SDS. Advised that pt should hold ASA 81mg  beginning 10/14/15 per Dr Saralyn Pilar. Diane voices understanding.

## 2015-10-09 LAB — CULTURE, URINE COMPREHENSIVE

## 2015-10-10 ENCOUNTER — Encounter
Admission: RE | Admit: 2015-10-10 | Discharge: 2015-10-10 | Disposition: A | Discharge status: Home / Self Care | Payer: Medicare Other | Source: Ambulatory Visit | Attending: Urology | Admitting: Urology

## 2015-10-10 DIAGNOSIS — Z01812 Encounter for preprocedural laboratory examination: Secondary | ICD-10-CM | POA: Diagnosis present

## 2015-10-10 DIAGNOSIS — Z01810 Encounter for preprocedural cardiovascular examination: Secondary | ICD-10-CM | POA: Diagnosis present

## 2015-10-10 HISTORY — DX: Chronic kidney disease, unspecified: N18.9

## 2015-10-10 HISTORY — DX: Atherosclerotic heart disease of native coronary artery without angina pectoris: I25.10

## 2015-10-10 HISTORY — DX: Cardiac arrhythmia, unspecified: I49.9

## 2015-10-10 HISTORY — DX: Hyperlipidemia, unspecified: E78.5

## 2015-10-10 HISTORY — DX: Gastro-esophageal reflux disease without esophagitis: K21.9

## 2015-10-10 HISTORY — DX: Unspecified dementia, unspecified severity, without behavioral disturbance, psychotic disturbance, mood disturbance, and anxiety: F03.90

## 2015-10-10 LAB — CBC
HCT: 35.6 % — ABNORMAL LOW (ref 40.0–52.0)
Hemoglobin: 12 g/dL — ABNORMAL LOW (ref 13.0–18.0)
MCH: 30.7 pg (ref 26.0–34.0)
MCHC: 33.8 g/dL (ref 32.0–36.0)
MCV: 90.7 fL (ref 80.0–100.0)
PLATELETS: 108 10*3/uL — AB (ref 150–440)
RBC: 3.92 MIL/uL — ABNORMAL LOW (ref 4.40–5.90)
RDW: 14.9 % — AB (ref 11.5–14.5)
WBC: 4.6 10*3/uL (ref 3.8–10.6)

## 2015-10-10 LAB — BASIC METABOLIC PANEL
Anion gap: 6 (ref 5–15)
BUN: 22 mg/dL — AB (ref 6–20)
CALCIUM: 8.9 mg/dL (ref 8.9–10.3)
CO2: 28 mmol/L (ref 22–32)
Chloride: 103 mmol/L (ref 101–111)
Creatinine, Ser: 1.27 mg/dL — ABNORMAL HIGH (ref 0.61–1.24)
GFR calc Af Amer: 57 mL/min — ABNORMAL LOW (ref >60)
GFR, EST NON AFRICAN AMERICAN: 49 mL/min — AB (ref >60)
GLUCOSE: 316 mg/dL — AB (ref 65–99)
Potassium: 4.4 mmol/L (ref 3.5–5.1)
Sodium: 137 mmol/L (ref 135–145)

## 2015-10-10 NOTE — Patient Instructions (Signed)
  Your procedure is scheduled [id number] 14, 2017 (Monday) Report to Same Day Surgery 2nd floor medical mall To find out your arrival time please call [phone] between 1PM - 3PM on  October 18, 2015 (Friday)  Remember: Instructions that are not followed completely may result in serious medical risk, up to and including death, or upon the discretion of your surgeon and anesthesiologist your surgery may need to be rescheduled.    _x___ 1. Do not eat food or drink liquids after midnight. No gum chewing or hard candies.     __x__ 2. No Alcohol for 24 hours before or after surgery.   __x__3. No Smoking for 24 prior to surgery.   ____  4. Bring all medications with you on the day of surgery if instructed.    __x__ 5. Notify your doctor if there is any change in your medical condition     (cold, fever, infections).     Do not wear jewelry, make-up, hairpins, clips or nail polish.  Do not wear lotions, powders, or perfumes. You may wear deodorant.  Do not shave 48 hours prior to surgery. Men may shave face and neck.  Do not bring valuables to the hospital.    [facility] is not responsible for any belongings or valuables.               Contacts, dentures or bridgework may not be worn into surgery.  Leave your suitcase in the car. After surgery it may be brought to your room.  For patients admitted to the hospital, discharge time is determined by your treatment team.   Patients discharged the day of surgery will not be allowed to drive home.    Please read over the following fact sheets that you were given:   [facility] Preparing for Surgery and or MRSA Information   _x___ Take these medicines the morning of surgery with A SIP OF WATER:    1. Amlodipine  2.Namenda  3.  4.  5.  6.  ____ Fleet Enema (as directed)   ____ Use CHG Soap or sage wipes as directed on instruction sheet   ____ Use inhalers on the day of surgery and bring to hospital day of surgery  ____ Stop  metformin 2 days prior to surgery    _x___ Take 1/2 of usual insulin dose the night before surgery and none on the morning of  Surgery (NO INSULIN THE MORNING OF SURGERY)         _x___ Stop aspirin or coumadin, or plavix (STOP ASPIRIN ON AUGUST 7 )  _x__ Stop Anti-inflammatories such as Advil, Aleve, Ibuprofen, Motrin, Naproxen,          Naprosyn, Goodies powders or aspirin products. Ok to take Tylenol.   ____ Stop supplements until after surgery.    ____ Bring C-Pap to the hospital.

## 2015-10-10 NOTE — Pre-Procedure Instructions (Signed)
PREOP EKG FAXED TO DR PARASCHOS

## 2015-10-10 NOTE — Pre-Procedure Instructions (Signed)
Glucose results called to Dr. Andree Elk (anestheslologist) , and faxed to [name] office.

## 2015-10-11 NOTE — Pre-Procedure Instructions (Signed)
Sherlon Handing, RN from [name] office called and stated [name] is ok with patient blood glucose level.

## 2015-10-11 NOTE — Pre-Procedure Instructions (Signed)
Received note from Dr. Saralyn Pilar office  stating "ok to proceed".

## 2015-10-16 ENCOUNTER — Other Ambulatory Visit: Payer: Self-pay | Admitting: Urology

## 2015-10-18 ENCOUNTER — Telehealth: Payer: Self-pay

## 2015-10-18 ENCOUNTER — Ambulatory Visit
Admission: RE | Admit: 2015-10-18 | Discharge: 2015-10-18 | Disposition: A | Discharge status: Home / Self Care | Payer: Medicare Other | Source: Ambulatory Visit | Attending: Urology | Admitting: Urology

## 2015-10-18 DIAGNOSIS — I700 Atherosclerosis of aorta: Secondary | ICD-10-CM | POA: Diagnosis not present

## 2015-10-18 DIAGNOSIS — Z90411 Acquired partial absence of pancreas: Secondary | ICD-10-CM | POA: Insufficient documentation

## 2015-10-18 DIAGNOSIS — D494 Neoplasm of unspecified behavior of bladder: Secondary | ICD-10-CM | POA: Diagnosis present

## 2015-10-18 DIAGNOSIS — K8681 Exocrine pancreatic insufficiency: Secondary | ICD-10-CM | POA: Diagnosis not present

## 2015-10-18 DIAGNOSIS — R160 Hepatomegaly, not elsewhere classified: Secondary | ICD-10-CM | POA: Insufficient documentation

## 2015-10-18 MED ORDER — IOPAMIDOL (ISOVUE-300) INJECTION 61%
100.0000 mL | Freq: Once | INTRAVENOUS | Status: AC | PRN
  Administered 2015-10-18: 100 mL via INTRAVENOUS

## 2015-10-18 NOTE — Telephone Encounter (Signed)
Notify patient there were some cancer cells in his urine so important to keep appt for bladder biopsy. - Dr. Octavia Bruckner with pt in reference to cytology and bladder bx. Pt voiced understanding.

## 2015-10-21 ENCOUNTER — Encounter
Admission: RE | Disposition: A | Discharge status: Home / Self Care | Payer: Self-pay | Source: Ambulatory Visit | Attending: Urology

## 2015-10-21 ENCOUNTER — Ambulatory Visit
Admission: RE | Admit: 2015-10-21 | Discharge: 2015-10-21 | Disposition: A | Discharge status: Home / Self Care | Payer: Medicare Other | Source: Ambulatory Visit | Attending: Urology | Admitting: Urology

## 2015-10-21 ENCOUNTER — Telehealth: Payer: Self-pay | Admitting: Urology

## 2015-10-21 DIAGNOSIS — R319 Hematuria, unspecified: Secondary | ICD-10-CM | POA: Diagnosis not present

## 2015-10-21 DIAGNOSIS — E119 Type 2 diabetes mellitus without complications: Secondary | ICD-10-CM | POA: Insufficient documentation

## 2015-10-21 DIAGNOSIS — N3289 Other specified disorders of bladder: Secondary | ICD-10-CM | POA: Insufficient documentation

## 2015-10-21 DIAGNOSIS — E785 Hyperlipidemia, unspecified: Secondary | ICD-10-CM | POA: Diagnosis not present

## 2015-10-21 DIAGNOSIS — Z539 Procedure and treatment not carried out, unspecified reason: Secondary | ICD-10-CM | POA: Diagnosis present

## 2015-10-21 DIAGNOSIS — K921 Melena: Secondary | ICD-10-CM | POA: Diagnosis not present

## 2015-10-21 DIAGNOSIS — I2510 Atherosclerotic heart disease of native coronary artery without angina pectoris: Secondary | ICD-10-CM | POA: Diagnosis not present

## 2015-10-21 DIAGNOSIS — I10 Essential (primary) hypertension: Secondary | ICD-10-CM | POA: Insufficient documentation

## 2015-10-21 DIAGNOSIS — Z8507 Personal history of malignant neoplasm of pancreas: Secondary | ICD-10-CM | POA: Insufficient documentation

## 2015-10-21 SURGERY — CYSTOSCOPY, WITH BIOPSY
Anesthesia: Choice

## 2015-10-21 MED ORDER — SODIUM CHLORIDE 0.9 % IV SOLN: INTRAVENOUS | Status: DC

## 2015-10-21 MED ORDER — CIPROFLOXACIN IN D5W 400 MG/200ML IV SOLN: 400.0000 mg | Freq: Once | INTRAVENOUS | Status: DC

## 2015-10-21 MED ORDER — FAMOTIDINE 20 MG PO TABS: 20.0000 mg | ORAL_TABLET | Freq: Once | ORAL | Status: DC

## 2015-10-21 SURGICAL SUPPLY — 27 items
BAG DRAIN CYSTO-URO LG1000N (MISCELLANEOUS) IMPLANT
CATH URETL 5X70 OPEN END (CATHETERS) IMPLANT
CONRAY 43 FOR UROLOGY 50M (MISCELLANEOUS) IMPLANT
CORD URO TURP 10FT (MISCELLANEOUS) IMPLANT
DRAPE UTILITY 15X26 TOWEL STRL (DRAPES) IMPLANT
ELECT REM PT RETURN 9FT ADLT (ELECTROSURGICAL)
ELECTRODE REM PT RTRN 9FT ADLT (ELECTROSURGICAL) IMPLANT
GLOVE BIO SURGEON STRL SZ 6.5 (GLOVE) IMPLANT
GLOVE BIO SURGEON STRL SZ7 (GLOVE) IMPLANT
GLOVE BIO SURGEONS STRL SZ 6.5 (GLOVE)
GOWN STRL REUS W/ TWL LRG LVL3 (GOWN DISPOSABLE) IMPLANT
GOWN STRL REUS W/TWL LRG LVL3 (GOWN DISPOSABLE)
INTRODUCER DILATOR DOUBLE (INTRODUCER) IMPLANT
KIT RM TURNOVER CYSTO AR (KITS) IMPLANT
PACK CYSTO AR (MISCELLANEOUS) IMPLANT
PREP PVP WINGED SPONGE (MISCELLANEOUS) IMPLANT
SENSORWIRE 0.038 NOT ANGLED (WIRE)
SET CYSTO W/LG BORE CLAMP LF (SET/KITS/TRAYS/PACK) IMPLANT
SHEATH URETERAL 12FRX35CM (MISCELLANEOUS) IMPLANT
SOL .9 NS 3000ML IRR  AL (IV SOLUTION)
SOL .9 NS 3000ML IRR UROMATIC (IV SOLUTION) IMPLANT
SOL PREP PVP 2OZ (MISCELLANEOUS)
SOLUTION PREP PVP 2OZ (MISCELLANEOUS) IMPLANT
SURGILUBE 2OZ TUBE FLIPTOP (MISCELLANEOUS) IMPLANT
WATER STERILE IRR 1000ML POUR (IV SOLUTION) IMPLANT
WATER STERILE IRR 3000ML UROMA (IV SOLUTION) IMPLANT
WIRE SENSOR 0.038 NOT ANGLED (WIRE) IMPLANT

## 2015-10-21 NOTE — Telephone Encounter (Signed)
Unfortunately, [name] ate a granola bar this morning so we will have to reschedule his procedure. Could you please help facilitate this.  Colton Espy, MD

## 2015-10-21 NOTE — Telephone Encounter (Signed)
Notified pt's daughter, [name], of surgery r/s to 11/05/15. Advised that pt may resume ASA 81mg  until 10/29/15 then hold until after surgery otherwise follow instructions given at pre-admit testing appt & be npo after mn. Diane voices understanding.

## 2015-10-21 NOTE — OR Nursing (Signed)
Patient reports eating between 8:30-9:00. Anesthesia notified as well as Dr Erlene Quan. Procedure canceled per Dr Erlene Quan and will reschedule with office.

## 2015-11-05 ENCOUNTER — Ambulatory Visit: Payer: Medicare Other | Admitting: Anesthesiology

## 2015-11-05 ENCOUNTER — Encounter
Admission: RE | Disposition: A | Discharge status: Home / Self Care | Payer: Self-pay | Source: Ambulatory Visit | Attending: Urology

## 2015-11-05 ENCOUNTER — Encounter: Payer: Self-pay | Admitting: *Deleted

## 2015-11-05 ENCOUNTER — Ambulatory Visit
Admission: RE | Admit: 2015-11-05 | Discharge: 2015-11-05 | Disposition: A | Discharge status: Home / Self Care | Payer: Medicare Other | Source: Ambulatory Visit | Attending: Urology | Admitting: Urology

## 2015-11-05 DIAGNOSIS — Z823 Family history of stroke: Secondary | ICD-10-CM | POA: Diagnosis not present

## 2015-11-05 DIAGNOSIS — R310 Gross hematuria: Secondary | ICD-10-CM | POA: Insufficient documentation

## 2015-11-05 DIAGNOSIS — I10 Essential (primary) hypertension: Secondary | ICD-10-CM | POA: Insufficient documentation

## 2015-11-05 DIAGNOSIS — I2510 Atherosclerotic heart disease of native coronary artery without angina pectoris: Secondary | ICD-10-CM | POA: Diagnosis not present

## 2015-11-05 DIAGNOSIS — D649 Anemia, unspecified: Secondary | ICD-10-CM | POA: Insufficient documentation

## 2015-11-05 DIAGNOSIS — Z800 Family history of malignant neoplasm of digestive organs: Secondary | ICD-10-CM | POA: Diagnosis not present

## 2015-11-05 DIAGNOSIS — Z8551 Personal history of malignant neoplasm of bladder: Secondary | ICD-10-CM | POA: Diagnosis present

## 2015-11-05 DIAGNOSIS — Z880 Allergy status to penicillin: Secondary | ICD-10-CM | POA: Insufficient documentation

## 2015-11-05 DIAGNOSIS — D494 Neoplasm of unspecified behavior of bladder: Secondary | ICD-10-CM

## 2015-11-05 DIAGNOSIS — E119 Type 2 diabetes mellitus without complications: Secondary | ICD-10-CM | POA: Diagnosis not present

## 2015-11-05 DIAGNOSIS — Z8507 Personal history of malignant neoplasm of pancreas: Secondary | ICD-10-CM | POA: Diagnosis not present

## 2015-11-05 DIAGNOSIS — D090 Carcinoma in situ of bladder: Secondary | ICD-10-CM | POA: Insufficient documentation

## 2015-11-05 DIAGNOSIS — I739 Peripheral vascular disease, unspecified: Secondary | ICD-10-CM | POA: Insufficient documentation

## 2015-11-05 DIAGNOSIS — C672 Malignant neoplasm of lateral wall of bladder: Secondary | ICD-10-CM

## 2015-11-05 HISTORY — PX: CYSTOSCOPY WITH FULGERATION: SHX6638

## 2015-11-05 HISTORY — PX: CYSTOSCOPY WITH BIOPSY: SHX5122

## 2015-11-05 LAB — GLUCOSE, CAPILLARY: GLUCOSE-CAPILLARY: 245 mg/dL — AB (ref 65–99)

## 2015-11-05 SURGERY — CYSTOSCOPY, WITH BIOPSY
Anesthesia: General | Wound class: Clean Contaminated

## 2015-11-05 MED ORDER — FENTANYL CITRATE (PF) 100 MCG/2ML IJ SOLN
INTRAMUSCULAR | Status: DC | PRN
  Administered 2015-11-05 (×2): 25 ug via INTRAVENOUS

## 2015-11-05 MED ORDER — SODIUM CHLORIDE 0.9 % IV SOLN
INTRAVENOUS | Status: DC
  Administered 2015-11-05 (×2): via INTRAVENOUS

## 2015-11-05 MED ORDER — FAMOTIDINE 20 MG PO TABS
ORAL_TABLET | ORAL | Status: AC
  Filled 2015-11-05: qty 1

## 2015-11-05 MED ORDER — PROPOFOL 10 MG/ML IV BOLUS
INTRAVENOUS | Status: DC | PRN
  Administered 2015-11-05: 150 mg via INTRAVENOUS

## 2015-11-05 MED ORDER — CIPROFLOXACIN IN D5W 400 MG/200ML IV SOLN
INTRAVENOUS | Status: AC
  Filled 2015-11-05: qty 200

## 2015-11-05 MED ORDER — LACTATED RINGERS IV SOLN
INTRAVENOUS | Status: DC | PRN
  Administered 2015-11-05: 08:00:00 via INTRAVENOUS

## 2015-11-05 MED ORDER — FENTANYL CITRATE (PF) 100 MCG/2ML IJ SOLN: 25.0000 ug | INTRAMUSCULAR | Status: DC | PRN

## 2015-11-05 MED ORDER — CIPROFLOXACIN IN D5W 400 MG/200ML IV SOLN
400.0000 mg | Freq: Once | INTRAVENOUS | Status: AC
  Administered 2015-11-05: 400 mg via INTRAVENOUS

## 2015-11-05 MED ORDER — PHENAZOPYRIDINE HCL 200 MG PO TABS: 200.0000 mg | ORAL_TABLET | Freq: Three times a day (TID) | ORAL | 0 refills | Status: DC | PRN

## 2015-11-05 MED ORDER — FAMOTIDINE 20 MG PO TABS
20.0000 mg | ORAL_TABLET | Freq: Once | ORAL | Status: AC
  Administered 2015-11-05: 20 mg via ORAL

## 2015-11-05 MED ORDER — LIDOCAINE HCL (CARDIAC) 20 MG/ML IV SOLN
INTRAVENOUS | Status: DC | PRN
  Administered 2015-11-05: 100 mg via INTRAVENOUS

## 2015-11-05 MED ORDER — ONDANSETRON HCL 4 MG/2ML IJ SOLN: 4.0000 mg | Freq: Once | INTRAMUSCULAR | Status: DC | PRN

## 2015-11-05 SURGICAL SUPPLY — 27 items
BAG DRAIN CYSTO-URO LG1000N (MISCELLANEOUS) ×5 IMPLANT
CATH URETL 5X70 OPEN END (CATHETERS) IMPLANT
CONRAY 43 FOR UROLOGY 50M (MISCELLANEOUS) ×5 IMPLANT
CORD URO TURP 10FT (MISCELLANEOUS) IMPLANT
DRAPE UTILITY 15X26 TOWEL STRL (DRAPES) ×5 IMPLANT
ELECT REM PT RETURN 9FT ADLT (ELECTROSURGICAL) ×5
ELECTRODE REM PT RTRN 9FT ADLT (ELECTROSURGICAL) ×3 IMPLANT
GLOVE BIO SURGEON STRL SZ 6.5 (GLOVE) ×4 IMPLANT
GLOVE BIO SURGEON STRL SZ7 (GLOVE) ×5 IMPLANT
GLOVE BIO SURGEONS STRL SZ 6.5 (GLOVE) ×1
GOWN STRL REUS W/ TWL LRG LVL3 (GOWN DISPOSABLE) ×6 IMPLANT
GOWN STRL REUS W/TWL LRG LVL3 (GOWN DISPOSABLE) ×4
INTRODUCER DILATOR DOUBLE (INTRODUCER) IMPLANT
KIT RM TURNOVER CYSTO AR (KITS) ×5 IMPLANT
PACK CYSTO AR (MISCELLANEOUS) ×5 IMPLANT
PREP PVP WINGED SPONGE (MISCELLANEOUS) IMPLANT
SENSORWIRE 0.038 NOT ANGLED (WIRE)
SET CYSTO W/LG BORE CLAMP LF (SET/KITS/TRAYS/PACK) ×5 IMPLANT
SHEATH URETERAL 12FRX35CM (MISCELLANEOUS) IMPLANT
SOL .9 NS 3000ML IRR  AL (IV SOLUTION)
SOL .9 NS 3000ML IRR UROMATIC (IV SOLUTION) IMPLANT
SOL PREP PVP 2OZ (MISCELLANEOUS)
SOLUTION PREP PVP 2OZ (MISCELLANEOUS) IMPLANT
SURGILUBE 2OZ TUBE FLIPTOP (MISCELLANEOUS) ×5 IMPLANT
WATER STERILE IRR 1000ML POUR (IV SOLUTION) ×5 IMPLANT
WATER STERILE IRR 3000ML UROMA (IV SOLUTION) ×5 IMPLANT
WIRE SENSOR 0.038 NOT ANGLED (WIRE) IMPLANT

## 2015-11-05 NOTE — H&P (View-Only) (Signed)
   History of Present Illness: Patient returns with a history of T2 urothelial carcinoma at the bladder neck resected September 2015 treated with carboplatin chemotherapy and concurrent radiation. He was seen June 2016 with a negative cystoscopy. No f/u since. Last CT scan was September 2016.  Patient reports no voiding complaints. He denies tissue area or gross hematuria.  Past Medical History:  Diagnosis Date  . Bladder cancer ([place])   . Diabetes ([place])   . Hematuria, gross 07/23/2014  . Hypertension   . Pancreatic cancer [facility])    s/p whipple procedure   Past Surgical History:  Procedure Laterality Date  . BLADDER SURGERY    . HERNIA REPAIR    . WHIPPLE PROCEDURE    . WHIPPLE PROCEDURE      Home Medications:   (Not in a hospital admission) Allergies:  Allergies  Allergen Reactions  . Penicillins     Family History  Problem Relation Age of Onset  . Cerebrovascular Accident Father   . Stomach cancer Sister    Social History:  reports that he has never smoked. He does not have any smokeless tobacco history on file. He reports that he does not drink alcohol or use drugs.  ROS: A complete review of systems was performed.  All systems are negative except for pertinent findings as noted. ROS   Physical Exam:  Vital signs in last 24 hours: @VSRANGES @ General:  Alert and oriented, No acute distress Cardiovascular: Regular rate and rhythm Lungs: Regular rate and effort Abdomen: Soft, nontender, nondistended, no abdominal masses Extremities: No edema Neurologic: Grossly intact  Procedure: After consent was obtained the penis was prepped and the patient draped in usual sterile fashion. The cystoscope was passed per urethra and the bladder inspected. He tolerated the procedure well. Findings: The urethra was unremarkable. The prostate was short and nonobstructive. The trigone and ureteral orifices were in their normal orthotopic position. The bladder neck appeared clear.  There was mild trabeculation. In the posterior left bladder there was a conspicuous area of erythema concerning for CIS. There were no papillary tumors.   Laboratory Data:  No results found for this or any previous visit (from the past 24 hour(s)). No results found for this or any previous visit (from the past 240 hour(s)). Creatinine: No results for input(s): CREATININE in the last 168 hours.  Impression/Assessment/plan:  Bladder neoplasm/erythema-patient with a history of T2 disease at the bladder neck and this appears clear. There is a conspicuous area in the posterior left bladder concerning for CIS. Discussed with the patient, his wife and daughter the nature risk benefits and alternatives to cystoscopy with bladder biopsy and fulguration. Also set him up for metastatic and upper tract imaging evaluation. If he ends up with a noncontrast CT, retrogrades can be considered in the OR. His recent GFR was around 50. I also sent urine for cytology.  Octavie Westerhold 10/07/2015, 10:10 AM

## 2015-11-05 NOTE — Discharge Instructions (Signed)
Transurethral Resection of Bladder Tumor (TURBT) or Bladder Biopsy ° ° °Definition: ° Transurethral Resection of the Bladder Tumor is a surgical procedure used to diagnose and remove tumors within the bladder. TURBT is the most common treatment for early stage bladder cancer. ° °General instructions: °   ° Your recent bladder surgery requires very little post hospital care but some definite precautions. ° °Despite the fact that no skin incisions were used, the area around the bladder incisions are raw and covered with scabs to promote healing and prevent bleeding. Certain precautions are needed to insure that the scabs are not disturbed over the next 2-4 weeks while the healing proceeds. ° °Because the raw surface inside your bladder and the irritating effects of urine you may expect frequency of urination and/or urgency (a stronger desire to urinate) and perhaps even getting up at night more often. This will usually resolve or improve slowly over the healing period. You may see some blood in your urine over the first 6 weeks. Do not be alarmed, even if the urine was clear for a while. Get off your feet and drink lots of fluids until clearing occurs. If you start to pass clots or don't improve call us. ° °Diet: ° °You may return to your normal diet immediately. Because of the raw surface of your bladder, alcohol, spicy foods, foods high in acid and drinks with caffeine may cause irritation or frequency and should be used in moderation. To keep your urine flowing freely and avoid constipation, drink plenty of fluids during the day (8-10 glasses). Tip: Avoid cranberry juice because it is very acidic. ° °Activity: ° °Your physical activity doesn't need to be restricted. However, if you are very active, you may see some blood in the urine. We suggest that you reduce your activity under the circumstances until the bleeding has stopped. ° °Bowels: ° °It is important to keep your bowels regular during the postoperative  period. Straining with bowel movements can cause bleeding. A bowel movement every other day is reasonable. Use a mild laxative if needed, such as milk of magnesia 2-3 tablespoons, or 2 Dulcolax tablets. Call if you continue to have problems. If you had been taking narcotics for pain, before, during or after your surgery, you may be constipated. Take a laxative if necessary. ° ° ° °Medication: ° °You should resume your pre-surgery medications unless told not to. In addition you may be given an antibiotic to prevent or treat infection. Antibiotics are not always necessary. All medication should be taken as prescribed until the bottles are finished unless you are having an unusual reaction to one of the drugs. ° ° °[facility] °[address], [address] °, [place] [address] °[phone] ° ° °AMBULATORY SURGERY  °DISCHARGE INSTRUCTIONS ° ° °1) The drugs that you were given will stay in your system until tomorrow so for the next 24 hours you should not: ° °A) Drive an automobile °B) Make any legal decisions °C) Drink any alcoholic beverage ° ° °2) You may resume regular meals tomorrow.  Today it is better to start with liquids and gradually work up to solid foods. ° °You may eat anything you prefer, but it is better to start with liquids, then soup and crackers, and gradually work up to solid foods. ° ° °3) Please notify your doctor immediately if you have any unusual bleeding, trouble breathing, redness and pain at the surgery site, drainage, fever, or pain not relieved by medication. ° ° ° °4)   Additional Instructions: ° ° ° ° ° ° ° °Please contact your physician with any problems or Same Day Surgery at [phone], Monday through Friday 6 am to 4 pm, or [facility] Main number at [phone]. ° ° ° ° °

## 2015-11-05 NOTE — Anesthesia Procedure Notes (Signed)
Procedure Name: LMA Insertion Date/Time: 11/05/2015 7:42 AM Performed by: [facility] Pre-anesthesia Checklist: Patient identified, Emergency Drugs available, Suction available and Patient being monitored Patient Re-evaluated:Patient Re-evaluated prior to inductionOxygen Delivery Method: Circle system utilized Preoxygenation: Pre-oxygenation with 100% oxygen Intubation Type: IV induction LMA: LMA inserted LMA Size: 3.5 Number of attempts: 1 Placement Confirmation: positive ETCO2 Dental Injury: Teeth and Oropharynx as per pre-operative assessment

## 2015-11-05 NOTE — Op Note (Signed)
Date of procedure: 11/05/15  Preoperative diagnosis:  1. History of muscle invasive bladder cancer 2. Bladder erythema of the right lateral wall 3. Positive urine cytology   Postoperative diagnosis:  1. Same as above   Procedure: 1. Cystoscopy 2. Bladder biopsy 3. Fulguration of suspicious bladder erythema  Surgeon: [name], MD  Anesthesia: General  Complications: None  Intraoperative findings: Approximately 2 cm patch of suspicious erythema involving the right lateral wall of the bladder just beyond the right UO suspicious for carcinoma in situ. Heavily trabeculated bladder.   EBL: minimal  Specimens: bladder biopsy  Drains: None  Indication: Colton Tyler is a 82 y.o. patient with history of muscle invasive bladder cancer with recurrence of suspicious right bladder erythema and positive urine cytology concerning for person and what in situ. He underwent CT urogram which showed no evidence of upper tract disease or metastatic diease.  After reviewing the management options for treatment, he elected to proceed with the above surgical procedure(s). We have discussed the potential benefits and risks of the procedure, side effects of the proposed treatment, the likelihood of the patient achieving the goals of the procedure, and any potential problems that might occur during the procedure or recuperation. Informed consent has been obtained.  Description of procedure:  The patient was taken to the operating room and general anesthesia was induced.  The patient was placed in the dorsal lithotomy position, prepped and draped in the usual sterile fashion, and preoperative antibiotics were administered. A preoperative time-out was performed.   A rigid 21 French cystoscope was advanced per urethra into the bladder. The bladder was noted to be heavily trabeculated with slight distortion of the bladder neck and trigonal anatomy. Just beyond the right ureteral orifice, there was  significant bladder wall erythema highly concerning for carcinoma in situ, measuring approximately 2 cm in diameter. There is no papillary lesions. The remainder of the bladder was relatively benign-appearing. At this point in time, call cup biopsy forceps are used to take approximately 3 samples from the erythematous patch. A few other random bladder biopsies were also performed on the posterior wall, left lateral wall as well. These were all passed off together as bladder biopsies. The site of each biopsy was then fulgurated using Bugbee electrocautery in the setting of water as the irrigant. The remainder of the erythematous patch was then carefully fulgurated with care taken to avoid any injury to the right UO. This was done for both hemostatic and therapeutic purposes. Finally, the bladder was drained and no active bleeding was noted. The patient was then cleaned and dried, repositioned the supine position, reversed from anesthesia, and taken to the PACU in stable condition.  Plan: Patient will follow-up in 7-10 days to review pathology results.  [name], M.D.

## 2015-11-05 NOTE — Transfer of Care (Signed)
Immediate Anesthesia Transfer of Care Note  Patient: Colton Tyler  Procedure(s) Performed: Procedure(s): CYSTOSCOPY WITH BIOPSY (N/A) CYSTOSCOPY WITH FULGERATION  Patient Location: PACU  Anesthesia Type:General  Level of Consciousness: awake, alert  and oriented  Airway & Oxygen Therapy: Patient Spontanous Breathing  Post-op Assessment: Report given to RN and Post -op Vital signs reviewed and stable  Post vital signs: Reviewed and stable  Last Vitals:  Vitals:   11/05/15 0850 11/05/15 0905  BP: (!) 145/61 128/62  Pulse: 66 (!) 53  Resp: 19 (!) 28  Temp:  36.4 C    Last Pain:  Vitals:   11/05/15 0905  TempSrc:   PainSc: 0-No pain         Complications: No apparent anesthesia complications

## 2015-11-05 NOTE — Anesthesia Postprocedure Evaluation (Signed)
Anesthesia Post Note  Patient: Colton Tyler  Procedure(s) Performed: Procedure(s) (LRB): CYSTOSCOPY WITH BIOPSY (N/A) CYSTOSCOPY WITH FULGERATION  Patient location during evaluation: PACU Anesthesia Type: General Level of consciousness: awake and alert Pain management: pain level controlled Vital Signs Assessment: post-procedure vital signs reviewed and stable Respiratory status: spontaneous breathing and respiratory function stable Cardiovascular status: stable Anesthetic complications: no    Last Vitals:  Vitals:   11/05/15 0850 11/05/15 0905  BP: (!) 145/61 128/62  Pulse: 66 (!) 53  Resp: 19 (!) 28  Temp:  36.4 C    Last Pain:  Vitals:   11/05/15 0905  TempSrc:   PainSc: 0-No pain                 Colton Tyler

## 2015-11-05 NOTE — Anesthesia Preprocedure Evaluation (Signed)
Anesthesia Evaluation  Patient identified by MRN, date of birth, ID band Patient awake    Reviewed: Allergy & Precautions, NPO status , Patient's Chart, lab work & pertinent test results  History of Anesthesia Complications Negative for: history of anesthetic complications  Airway Mallampati: II       Dental   Pulmonary neg pulmonary ROS,           Cardiovascular hypertension, Pt. on medications + CAD and + Peripheral Vascular Disease  + dysrhythmias      Neuro/Psych PSYCHIATRIC DISORDERS (short term memory problems)    GI/Hepatic GERD (pt off medicaitons now, no problems)  ,  Endo/Other  diabetes  Renal/GU Renal disease     Musculoskeletal   Abdominal   Peds  Hematology  (+) anemia ,   Anesthesia Other Findings   Reproductive/Obstetrics                             Anesthesia Physical Anesthesia Plan  ASA: II  Anesthesia Plan: General   Post-op Pain Management:    Induction: Intravenous  Airway Management Planned: Oral ETT  Additional Equipment:   Intra-op Plan:   Post-operative Plan:   Informed Consent: I have reviewed the patients History and Physical, chart, labs and discussed the procedure including the risks, benefits and alternatives for the proposed anesthesia with the patient or authorized representative who has indicated his/her understanding and acceptance.     Plan Discussed with:   Anesthesia Plan Comments:         Anesthesia Quick Evaluation

## 2015-11-05 NOTE — Interval H&P Note (Signed)
History and Physical Interval Note:  11/05/2015 7:22 AM  Colton Tyler  has presented today for surgery, with the diagnosis of BLADDER NEOPLASM/ ERYTHEMA  The various methods of treatment have been discussed with the patient and family. After consideration of risks, benefits and other options for treatment, the patient has consented to  Procedure(s): CYSTOSCOPY WITH BIOPSY (N/A) CYSTOSCOPY WITH FULGERATION (N/A) CYSTOSCOPY WITH RETROGRADE PYELOGRAM (Bilateral) as a surgical intervention .  The patient's history has been reviewed, patient examined, no change in status, stable for surgery.  I have reviewed the patient's chart and labs.  Questions were answered to the patient's satisfaction.    RRR CTAB  [name]

## 2015-11-07 ENCOUNTER — Encounter: Payer: Self-pay | Admitting: Urology

## 2015-11-07 LAB — SURGICAL PATHOLOGY

## 2015-11-08 ENCOUNTER — Telehealth: Payer: Self-pay

## 2015-11-08 NOTE — Telephone Encounter (Signed)
Pt daughter called stating pt is currently having urinary leakage post bladder bx. Reinforced with daughter that can be normal post surgery but should gradually get better. Daughter voiced understanding.

## 2015-11-15 ENCOUNTER — Ambulatory Visit (INDEPENDENT_AMBULATORY_CARE_PROVIDER_SITE_OTHER): Payer: Medicare Other | Admitting: Urology

## 2015-11-15 VITALS — BP 171/79 | HR 51 | Ht 67.0 in | Wt 128.0 lb

## 2015-11-15 DIAGNOSIS — K7689 Other specified diseases of liver: Secondary | ICD-10-CM | POA: Diagnosis not present

## 2015-11-15 DIAGNOSIS — C672 Malignant neoplasm of lateral wall of bladder: Secondary | ICD-10-CM

## 2015-11-15 DIAGNOSIS — K769 Liver disease, unspecified: Secondary | ICD-10-CM

## 2015-11-15 NOTE — Patient Instructions (Signed)
Bacillus Calmette-Guerin Live, BCG intravesical solution  What is this medicine?  BACILLUS CALMETTE-GUERIN LIVE, BCG (ba SIL us KAL met gay RAYN) is a bacteria solution. This medicine stimulates the immune system to ward off cancer cells. It is used to treat bladder cancer.  This medicine may be used for other purposes; ask your health care provider or pharmacist if you have questions.  What should I tell my health care provider before I take this medicine?  They need to know if you have any of these conditions:  -aneurysm  -blood in the urine  -bladder biopsy within 2 weeks  -fever or infection  -immune system problems  -leukemia  -lymphoma  -myasthenia gravis  -need organ transplant  -prosthetic device like arterial graft, artificial joint, prosthetic heart valve  -recent or ongoing radiation therapy  -tuberculosis  -an unusual or allergic reaction to Bacillus Calmette-Guerin Live, BCG, latex, other medicines, foods, dyes, or preservatives  -pregnant or trying to get pregnant  -breast-feeding  How should I use this medicine?  This drug is given as a catheter infusion into the bladder. It is administered in a hospital or clinic by a specially trained health care professional. You will be given directions to follow before the treatment. Follow your doctor's directions carefully. Try to hold this medicine in your bladder for 2 hours after treatment.  Talk to your pediatrician regarding the use of this medicine in children. Special care may be needed.  Overdosage: If you think you have taken too much of this medicine contact a poison control center or emergency room at once.  NOTE: This medicine is only for you. Do not share this medicine with others.  What if I miss a dose?  It is important not to miss your dose. Call your doctor or health care professional if you are unable to keep an appointment.  What may interact with this medicine?  -antibiotics  -medicines to suppress your immune system like chemotherapy agents  or corticosteroids  -medicine to treat tuberculosis  This list may not describe all possible interactions. Give your health care provider a list of all the medicines, herbs, non-prescription drugs, or dietary supplements you use. Also tell them if you smoke, drink alcohol, or use illegal drugs. Some items may interact with your medicine.  What should I watch for while using this medicine?  Visit your doctor for checks on your progress. This drug may make you feel generally unwell. Contact your doctor if your symptoms last more than 2 days or if they get worse. Call your doctor right away if you have a severe or unusual symptom.  Infection can be spread to others through contact with this medicine. To prevent the spread of infection follow your doctor's directions carefully after treatment. For the first 6 hours after each treatment, sit down on the toilet to urinate. After urinating, add 2 cups of bleach to the toilet bowl and let set for 15 minutes before flushing. Wash your hands before and after using the restroom.  Drink water or other fluids as directed after treatment with this medicine.  Do not become pregnant while taking this medicine. Women should inform their doctor if they wish to become pregnant or think they might be pregnant. There is a potential for serious side effects to an unborn child. Talk to your health care professional or pharmacist for more information. Do not breast-feed an infant while taking this medicine.  What side effects may I notice from receiving this medicine?  Side   effects that you should report to your doctor or health care professional as soon as possible:  -allergic reactions like skin rash, itching or hives, swelling of the face, lips, or tongue  -signs of infection - fever or chills, cough, sore throat, pain or difficulty passing urine  -signs of decreased red blood cells - unusually weak or tired, fainting spells, lightheadedness  -blood in urine  -breathing  problems  -cough  -eye pain, redness  -flu-like symptoms  -joint pain  -bladder-area pain for more than 2 days after treatment  -trouble passing urine or change in the amount of urine  -vomiting  -yellowing of the eyes or skin  Side effects that usually do not require immediate medical attention (report these side effects to your doctor or health care professional if they continue or are bothersome):  -bladder spasm  -burning when passing urine within 2 days of treatment  -feel need to pass urine often or wake up at night to pass urine  -loss of appetite  This list may not describe all possible side effects. Call your doctor for medical advice about side effects. You may report side effects to FDA at 1-800-FDA-1088.  Where should I keep my medicine?  This drug is given in a hospital or clinic and will not be stored at home.  NOTE: This sheet is a summary. It may not cover all possible information. If you have questions about this medicine, talk to your doctor, pharmacist, or health care provider.      2016, Elsevier/Gold Standard. (2007-05-26 13:54:01)

## 2015-11-15 NOTE — Progress Notes (Signed)
11/15/2015 11:57 AM   Estell Harpin 07/27/1925 [id number]  Referring provider: Kirk Ruths, MD [facility] [place], [place] [address]  Chief Complaint  Patient presents with  . 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  He returned to the office on 10/07/2015 at which time there are multiple lesions within the bladder concerning for carcinoma in situ with positive urine cytology.  He returns to the office today after recent return to the operating room TURBT of a recurrent lesion on 11/05/15.  Her operatively, he was noted to have an at least 2 cm area of patchy erythema involving the right lateral bladder wall beyond the right UO couldn't suspicious for CIS. His bladder was heavily trabeculated. There are no obvious papillary lesions.  Surgical pathology was consistent with urothelial carcinoma in situ as well as high-grade invasive disease into the lamina propria. No muscularis propria was present on the biopsies.  The suspicious area was fulgurated completely.  He returns today with his daughter and wife to discuss the pathology results. He tolerated the procedure fairly well. He did have some gross hematuria which has since resolved. He's had no voiding issues.     Most recent imaging in the form of CT urogram on 10/18/2015 shows no clear evidence of metastatic disease. He does have a small liver lesion which is somewhat atypical for metastatic disease.  PMH: Past Medical History:  Diagnosis Date  . Bladder cancer ([place])   . Chronic kidney disease   . Coronary artery disease   . Dementia   . Diabetes ([facility])   . Dysrhythmia   . GERD (gastroesophageal reflux disease)   . Hematuria, gross 07/23/2014  . Hyperlipidemia   . Hypertension   . Pancreatic cancer [facility])    s/p whipple procedure      Surgical History: Past Surgical History:  Procedure Laterality Date  . BLADDER SURGERY    . CYSTOSCOPY WITH BIOPSY N/A 11/05/2015   Procedure: CYSTOSCOPY WITH BIOPSY;  Surgeon: [name], MD;  Location: [facility] ORS;  Service: Urology;  Laterality: N/A;  . CYSTOSCOPY WITH FULGERATION  11/05/2015   Procedure: CYSTOSCOPY WITH FULGERATION;  Surgeon: [name], MD;  Location: [facility] ORS;  Service: Urology;;  . HERNIA REPAIR Left    Inguinal Hernia Repair  . PORTACATH PLACEMENT Right 2015   [facility]  . WHIPPLE PROCEDURE    . WHIPPLE PROCEDURE      Home Medications:    Medication List       Accurate as of 11/15/15 11:59 PM. Always use your most recent med list.          amLODipine 10 MG tablet Commonly known as:  NORVASC Take 10 mg by mouth daily.   aspirin 81 MG tablet Take 81 mg by mouth daily.   LANTUS SOLOSTAR 100 UNIT/ML Solostar Pen Generic drug:  Insulin Glargine Inject into the skin.   NAMENDA 10 MG tablet Generic drug:  memantine Take 10 mg by mouth daily.   simvastatin 20 MG tablet Commonly known as:  ZOCOR Take 20 mg by mouth daily at 12 noon.   sitaGLIPtin 25 MG tablet Commonly known as:  JANUVIA Take 25 mg by mouth daily at 12 noon.       Allergies:  Allergies  Allergen Reactions  . Penicillins Other (See Comments)    "  unknown"    Family History: Family History  Problem Relation Age of Onset  . Cerebrovascular Accident Father   . Stomach cancer Sister     Social History:  reports that he has never smoked. He has never used smokeless tobacco. He reports that he does not drink alcohol or use drugs.  ROS: UROLOGY Frequent Urination?: No Hard to postpone urination?: No Burning/pain with urination?: No Get up at night to urinate?: No Leakage of urine?: No Urine stream starts and stops?: No Trouble starting stream?: No Do you have to strain to urinate?: No Blood in urine?: No Urinary tract infection?: No Sexually transmitted disease?:  No Injury to kidneys or bladder?: No Painful intercourse?: No Weak stream?: No Erection problems?: No Penile pain?: No  Gastrointestinal Nausea?: No Vomiting?: No Indigestion/heartburn?: No Diarrhea?: No Constipation?: No  Constitutional Fever: No Night sweats?: No Weight loss?: No Fatigue?: No  Skin Skin rash/lesions?: No Itching?: No  Eyes Blurred vision?: No Double vision?: No  Ears/Nose/Throat Sore throat?: No Sinus problems?: No  Hematologic/Lymphatic Swollen glands?: No Easy bruising?: No  Cardiovascular Leg swelling?: No Chest pain?: No  Respiratory Cough?: No Shortness of breath?: No  Endocrine Excessive thirst?: No  Musculoskeletal Back pain?: No Joint pain?: No  Neurological Headaches?: No Dizziness?: No  Psychologic Depression?: No Anxiety?: No  Physical Exam: BP (!) 171/79   Pulse (!) 51   Ht 5\' 7"  (1.702 m)   Wt 128 lb (58.1 kg)   BMI 20.05 kg/m   Constitutional:  Alert and oriented, No acute distress. Elderly, appears slightly younger than stated age. Deep by daughter and wife today. HEENT: [place] AT, moist mucus membranes.  Trachea midline, no masses. Cardiovascular: No clubbing, cyanosis, or edema. Respiratory: Normal respiratory effort, no increased work of breathing. GI: Abdomen is soft, nontender, nondistended, no abdominal masses GU: No CVA tenderness.  Skin: No rashes, bruises or suspicious lesions. Neurologic: Grossly intact, no focal deficits, moving all 4 extremities. Psychiatric: Normal mood and affect.  Laboratory Data: Lab Results  Component Value Date   WBC 4.6 10/10/2015   HGB 12.0 (L) 10/10/2015   HCT 35.6 (L) 10/10/2015   MCV 90.7 10/10/2015   PLT 108 (L) 10/10/2015    Lab Results  Component Value Date   CREATININE 1.27 (H) 10/10/2015    Pertinent Imaging: CLINICAL DATA:  Restaging bladder cancer. History of T2 urothelial carcinoma of the bladder neck resected September 2015 with carboplatin  chemotherapy and concurrent radiation therapy. History of pancreatic cancer with Whipple procedure.  EXAM: CT ABDOMEN AND PELVIS WITHOUT AND WITH CONTRAST  TECHNIQUE: Multidetector CT imaging of the abdomen and pelvis was performed following the standard protocol before and following the bolus administration of intravenous contrast.  CONTRAST:  187mL ISOVUE-300 IOPAMIDOL (ISOVUE-300) INJECTION 61%  COMPARISON:  CTs 11/09/2014 and 02/23/2014.  FINDINGS: Lower chest: Clear lung bases per pleural or pericardial effusion. Coronary artery calcifications are noted.  Hepatobiliary: There is a new ill-defined low-density mass inferiorly in the left hepatic lobe (segment 3) measuring approximately 3.5 x 2.3 x 2.4 cm (image 17 of series 4). No other focal hepatic abnormalities are identified. A small amount of pneumobilia is noted status post cholecystectomy and Whipple procedure.  Pancreas: The remaining pancreas is atrophied, but stable status post Whipple procedure. No evidence of pancreatic ductal dilatation or surrounding inflammatory change.  Spleen: Normal in size without focal abnormality.  Adrenals/Urinary Tract: Both adrenal glands appear normal. Both kidneys demonstrate cortical scarring and small cysts. No evidence of renal  mass, hydronephrosis or focal urothelial lesion. The bladder is thick-walled and trabeculated with scattered small diverticula.  Stomach/Bowel: Stable postsurgical changes from Whipple procedure. The small bowel, appendix and proximal colon appear normal. There is wall thickening of the colon extending from the hepatic flexure to the splenic flexure. Moderate stool is present throughout the colon.  Vascular/Lymphatic: There are no enlarged abdominal or pelvic lymph nodes. Moderate aortic and branch vessel atherosclerosis. No focal aneurysm or large vessel occlusion identified.  Reproductive: The prostate gland and seminal vesicles  appear unchanged.  Other: Stable small supraumbilical hernia containing only fat. No ascites or peritoneal nodularity. Stable postsurgical changes in right groin.  Musculoskeletal: No acute or significant osseous findings. The bones are diffusely demineralized. There is a chronic L3 compression fracture. The sacroiliac joints appear ankylosed.  IMPRESSION: 1. New ill-defined mass in segment 3 of the liver. This is nonspecific and not a typical appearance for an isolated metastasis. It could reflect focal fat related to prior abdominal radiation. MRI recommended for more definitive characterization. 2. The pancreas appears stable status post Whipple procedure. No other evidence of metastatic disease. 3. Wall thickening of the transverse colon, also possibly related to prior radiation therapy. 4. Generalized bladder wall thickening and trabeculation without apparent focal lesion. 5.  Aortic Atherosclerosis (ICD10-170.0)   Electronically Signed   By: Richardean Sale M.D.   On: 10/18/2015 13:17  Assessment & Plan:    1. Malignant neoplasm of lateral wall of urinary bladder (HCC) History of T2 TCC s/p chemo/ rads 2015 Recurrence of Hg T1/ CIS on recent TURBT, pathology reviewed with patient No other evidence of metastatic diease on CT abd/pelvis Natural history of bladder cancer viewed today  Treatment options discussed today in detail.  Given that there is no evidence of metastatic disease at this point, I would recommend local therapy to the bladder. We discussed return to the operating room for more extensive TURBT given that there was no muscle in the specimen per AUA guidelines. He is very hesitant to return to the operating room again given his age and frailty. In addition, some likely not change our management as he is not a candidate for cystectomy and his artery undergone radiation and chemotherapy in the past for muscle invasive bladder cancer. As such, we will defer  repeat TUR.  We discussed BCG therapy today in detail. We discussed the treatment course including induction and maintenance schedule. We discussed the risks of BCG including bladder irritation, infection, BCG sepsis, granuloma formation, amongst others. All of his questions were answered today. He and his family would like to proceed with an induction course of BCG. We will schedule this for a few weeks after his bladder is healed from recent TUR.  2. Liver lesion, left lobe Etiology unclear, not consistent with metastatic diease Discussed finding with Dr. Lin Landsman --> recommends MR Call daughter Karenann Cai [phone] with results/ plna   Return for start induction BCG x 6 weeks in ~3 weeks, then cysto in 3 months thereafter .  [name], MD  [facility] [address] [place], [place] [address] [phone]  I spent 25 min with this patient of which greater than 50% was spent in counseling and coordination of care with the patient.

## 2015-11-18 ENCOUNTER — Inpatient Hospital Stay: Payer: Medicare Other | Attending: Hematology and Oncology

## 2015-11-18 DIAGNOSIS — C672 Malignant neoplasm of lateral wall of bladder: Secondary | ICD-10-CM | POA: Insufficient documentation

## 2015-11-18 DIAGNOSIS — Z452 Encounter for adjustment and management of vascular access device: Secondary | ICD-10-CM | POA: Diagnosis not present

## 2015-11-18 DIAGNOSIS — Z95828 Presence of other vascular implants and grafts: Secondary | ICD-10-CM

## 2015-11-18 MED ORDER — HEPARIN SOD (PORK) LOCK FLUSH 100 UNIT/ML IV SOLN
500.0000 [IU] | Freq: Once | INTRAVENOUS | Status: AC
  Administered 2015-11-18: 500 [IU] via INTRAVENOUS

## 2015-11-18 MED ORDER — SODIUM CHLORIDE 0.9% FLUSH
10.0000 mL | INTRAVENOUS | Status: DC | PRN
Start: 2015-11-18 — End: 2015-11-18
  Administered 2015-11-18: 10 mL via INTRAVENOUS
  Filled 2015-11-18: qty 10

## 2015-11-22 ENCOUNTER — Other Ambulatory Visit: Payer: Self-pay | Admitting: *Deleted

## 2015-11-22 DIAGNOSIS — D649 Anemia, unspecified: Secondary | ICD-10-CM

## 2015-11-25 NOTE — Addendum Note (Signed)
Addended by: [name] on: 11/25/2015 10:21 AM   Modules accepted: Orders

## 2015-11-26 ENCOUNTER — Inpatient Hospital Stay: Payer: Medicare Other

## 2015-11-26 ENCOUNTER — Inpatient Hospital Stay: Payer: Medicare Other | Admitting: Hematology and Oncology

## 2015-11-26 ENCOUNTER — Other Ambulatory Visit: Payer: Self-pay

## 2015-11-26 DIAGNOSIS — D649 Anemia, unspecified: Secondary | ICD-10-CM

## 2015-11-26 NOTE — Progress Notes (Deleted)
[facility] day:  11/26/15   Chief Complaint: Colton Tyler is an 82 y.o. male with bladder cancer who is seen for 9 month assessment.  HPI: The patient was last seen in the medical oncology clinic on 03/05/2015. At that time, he felt good.  He denied any hematuria.  His memory was poor.  Exam was unremarkable.  Hematocrit was 38.2.  Platelet count was 114,000.  SPEP was negative.  Free light chain ratio was normal.  B12 was 528 and folate 38.0.  Ferritin was 138 with normal iron studies.  CBC on 05/31/2015 revealed a hematocrit of 37.7, hemoglobin 12.7, MCV 90.9, platelets 103,000, WBC 5200 with an ANC of 3700.  Cystoscopy on 10/07/2015 revealed multiple lesions within the bladder concerning for carcinoma in situ with positive urine cytology.  He returns to the office today after recent return to the operating room TURBT of a recurrent lesion on 11/05/15.  Her operatively, he was noted to have an at least 2 cm area of patchy erythema involving the right lateral bladder wall beyond the right UO couldn't suspicious for CIS. His bladder was heavily trabeculated. There are no obvious papillary lesions.  Surgical pathology was consistent with urothelial carcinoma in situ as well as high-grade invasive disease into the lamina propria. No muscularis propria was present on the biopsies.  The suspicious area was fulgurated completely.  He returns today with his daughter and wife to discuss the pathology results. He tolerated the procedure fairly well. He did have some gross hematuria which has since resolved. He's had no voiding issues.  CT urogram on 10/18/2015 revealed a new ill-defined mass in segment 3 of the liver.  MRI was recommended.  The pancreas appears stable status post Whipple procedure.  There was no other evidence of metastatic disease.  There was wall thickening of the transverse colon, also possibly related to prior radiation therapy. There was  generalized bladder wall thickening and trabeculation without apparent focal lesion.  He underwent cystoscopy on 11/05/2015.  He was noted to have an at least a 2 cm area of patchy erythema involving the right lateral bladder wall beyond the right UO.  His bladder was heavily trabeculated. There are no obvious papillary lesions.  The suspicious area was fulgurated completley.  Pathology revealed invasive urothelial carcinoma invading the superficial lamina propria (stage T1).  No mucularis propria was present.  There was also urothelial carcinoma in situ.    He is scheduled to receive induction and maintenance BCG treatment.  He is scheduled for liver MRI.   Past Medical History:  Diagnosis Date  . Bladder cancer ([place])   . Chronic kidney disease   . Coronary artery disease   . Dementia   . Diabetes ([place])   . Dysrhythmia   . GERD (gastroesophageal reflux disease)   . Hematuria, gross 07/23/2014  . Hyperlipidemia   . Hypertension   . Pancreatic cancer [facility])    s/p whipple procedure   Past Surgical History:  Procedure Laterality Date  . BLADDER SURGERY    . CYSTOSCOPY WITH BIOPSY N/A 11/05/2015   Procedure: CYSTOSCOPY WITH BIOPSY;  Surgeon: [name], MD;  Location: [facility] ORS;  Service: Urology;  Laterality: N/A;  . CYSTOSCOPY WITH FULGERATION  11/05/2015   Procedure: CYSTOSCOPY WITH FULGERATION;  Surgeon: [name], MD;  Location: [facility] ORS;  Service: Urology;;  . HERNIA REPAIR Left    Inguinal Hernia Repair  . PORTACATH PLACEMENT Right 2015   [facility]  .  WHIPPLE PROCEDURE    . WHIPPLE PROCEDURE     Family History  Problem Relation Age of Onset  . Cerebrovascular Accident Father   . Stomach cancer Sister    Social History:  reports that he has never smoked. He has never used smokeless tobacco. He reports that he does not drink alcohol or use drugs.  The patient is accompanied by his daughter, Colton Tyler.  Allergies:  Allergies  Allergen Reactions  . Penicillins Other (See  Comments)    "unknown"   Current Medications: Current Outpatient Prescriptions  Medication Sig Dispense Refill  . amLODipine (NORVASC) 10 MG tablet Take 10 mg by mouth daily.     Marland Kitchen aspirin 81 MG tablet Take 81 mg by mouth daily.     . Insulin Glargine (LANTUS SOLOSTAR) 100 UNIT/ML Solostar Pen Inject into the skin.    . memantine (NAMENDA) 10 MG tablet Take 10 mg by mouth daily.     . simvastatin (ZOCOR) 20 MG tablet Take 20 mg by mouth daily at 12 noon.     . sitaGLIPtin (JANUVIA) 25 MG tablet Take 25 mg by mouth daily at 12 noon.      No current facility-administered medications for this visit.    Facility-Administered Medications Ordered in Other Visits  Medication Dose Route Frequency Provider Last Rate Last Dose  . sodium chloride 0.9 % injection 10 mL  10 mL Intravenous PRN Forest Gleason, MD   10 mL at 07/27/14 1136   Review of Systems:  GENERAL:  Feels fine.  No fevers, sweats or weight loss.  Weight stable. PERFORMANCE STATUS (ECOG):  1 HEENT:  No visual changes, runny nose, sore throat, mouth sores or tenderness. Lungs: No shortness of breath or cough.  No hemoptysis. Cardiac:  No chest pain, palpitations, orthopnea, or PND. GI:  No nausea, vomiting, diarrhea, constipation, melena or hematochezia. GU:  No urgency, frequency, dysuria, or hematuria. Musculoskeletal:  No bone pain.  No joint pain.  No muscle tenderness. Extremities:  No pain or swelling. Skin:  No rashes or skin changes.  No bruising or bleeding. Neuro:  No headache, numbness or weakness, balance or coordination issues. Endocrine:  Diabetes.  No thyroid issues, hot flashes or night sweats. Psych:  No mood changes, depression or anxiety. Pain:  No focal pain. Review of systems:  All other systems reviewed and found to be negative.   Physical Exam: There were no vitals taken for this visit. GENERAL:  Well developed, well nourished, sitting comfortably in the exam room in no acute distress. MENTAL STATUS:   Alert and oriented to person, place and time. HEAD:  White hair.  Male pattern baldness with sun induced changes to scalp.  Normocephalic, atraumatic, face symmetric, no Cushingoid features. EYES:  Glasses.  Blue eyes.  Pupils equal round and reactive to light and accomodation.  No conjunctivitis or scleral icterus. ENT:  Oropharynx clear without lesion.  Tongue normal. Mucous membranes moist.  RESPIRATORY:  Clear to auscultation without rales, wheezes or rhonchi. CARDIOVASCULAR:  Regular rate and rhythm without murmur, rub or gallop. ABDOMEN:  Soft, non-tender, with active bowel sounds, and no hepatosplenomegaly.  No masses. SKIN:  No rashes, ulcers or lesions. EXTREMITIES: No edema, no skin discoloration or tenderness.  No palpable cords. LYMPH NODES: No palpable cervical, supraclavicular, axillary or inguinal adenopathy  NEUROLOGICAL: Unremarkable. PSYCH:  Appropriate.   No visits with results within 3 Day(s) from this visit.  Latest known visit with results is:  Admission on 11/05/2015, Discharged on 11/05/2015  Component Date Value Ref Range Status  . Glucose-Capillary 11/05/2015 245* 65 - 99 mg/dL Final  . SURGICAL PATHOLOGY 11/07/2015    Final                   Value:Surgical Pathology CASE: [id number] PATIENT: Pricilla Handler Surgical Pathology Report     SPECIMEN SUBMITTED: A. Bladder; biopsy  CLINICAL HISTORY: None provided  PRE-OPERATIVE DIAGNOSIS: Bladder neoplasm/erythema  POST-OPERATIVE DIAGNOSIS: Bladder neoplasm     DIAGNOSIS: A. BLADDER; BIOPSY: - INVASIVE UROTHELIAL CARCINOMA. - UROTHELIAL CARCINOMA IN SITU.  Comment: In this sample high-grade urothelial carcinoma invades the superficial lamina propria, consistent with tumor classification T1. No muscularis propria is present in the biopsies.  The diagnosis was called to Toniann Fail in Dr. Cherrie Gauze office on 11/07/15 at 3:30 PM. Read-back was performed.   GROSS DESCRIPTION: A. Labeled:  bladder biopsy Tissue fragment(s): 5 Size: 0.1-0.3 cm Description: pink to tan fragments  Entirely submitted in 1 cassette(s).  Final Diagnosis performed by Bryan Lemma, MD.  Electronically signed 11/07/2015 4:33:55PM    The electronic signature indicates                          that the named Attending Pathologist has evaluated the specimen  Technical component performed at [facility], [address], [place], [place] [address] Lab: [phone] Dir: Darrick Penna. Evette Doffing, MD  Professional component performed at [facility], [facility], [place], [place], [place] [address] Lab: [phone] Dir: Dellia Nims. Reuel Derby, MD     Assessment:  BALKE METTERT is an 82 y.o. male with a history of clinical stage II (T2NxM0) bladder cancer.  He underwent a TURBT on 08/14/2013.  Pathology revealed a 3 cm high grade base of bladder tumor which invaded the muscularis propria.  There was lymphovascular invasion.   He received 7 weekly cycles of carboplatin (10/03/2013 - 11/21/2013) with radiation (10/17/2013 - 12/08/2013).  Chest, abdomen, and pelvic CT scan on 02/23/2014 revealed no evidence of metastic disease.  He underwent cystoscopy in 04/2014 per his wife (unknown results).  He has a history of pancreatic cancer in the 1960s/1970s.  He underwent Whipple.  He has had no evidence of recurrent disease.  Chest, abdomen, and pelvic CT scan on 11/09/2014 revealed no evidence of recurrent disease. He has diverticulosis.  He was on oral iron TID for some time likely secondary to initial hematuria.  Iron was discontinued on 04/27/2014.  Diet is good.  His last colonoscopy was 4 years ago.  He denies any melena or hematochezia.  Iron stores are good.  He has chronic kidney disease (creatinine clearance 35 ml/min).  He has mild thrombocytopenia since 07/2013 of unclear etiology.  Platelet count has ranged from 65,000 - 134,000 without trend.  He is on no new medications or herbal products.   He denies any bruising or bleeding.  Symptomatically, he feels good.  He denies any hematuria.  His memory is poor.  Exam is unremarkable.  Hematocrit is improving.  Platelet count is 114,000.  Plan: 1. Labs today:  CBC with diff, CMP, ferritin. 2.  3. Port flush every 6-8 weeks (due now). 4. RTC in 3 months for labs (CBC with diff). 5. RTC in 6 months for MD assessment and labs (CBC with diff, CMP, +/- others).   Lequita Asal, MD  11/26/2015, 5:42 AM

## 2015-12-03 ENCOUNTER — Ambulatory Visit
Admission: RE | Admit: 2015-12-03 | Discharge: 2015-12-03 | Disposition: A | Discharge status: Home / Self Care | Payer: Medicare Other | Source: Ambulatory Visit | Attending: Urology | Admitting: Urology

## 2015-12-03 DIAGNOSIS — K769 Liver disease, unspecified: Secondary | ICD-10-CM

## 2015-12-03 DIAGNOSIS — K760 Fatty (change of) liver, not elsewhere classified: Secondary | ICD-10-CM | POA: Insufficient documentation

## 2015-12-03 DIAGNOSIS — K7689 Other specified diseases of liver: Secondary | ICD-10-CM | POA: Diagnosis present

## 2015-12-03 LAB — POCT I-STAT CREATININE: Creatinine, Ser: 1.3 mg/dL — ABNORMAL HIGH (ref 0.61–1.24)

## 2015-12-03 MED ORDER — GADOBENATE DIMEGLUMINE 529 MG/ML IV SOLN
10.0000 mL | Freq: Once | INTRAVENOUS | Status: AC | PRN
  Administered 2015-12-03: 10 mL via INTRAVENOUS

## 2015-12-04 ENCOUNTER — Telehealth: Payer: Self-pay

## 2015-12-04 NOTE — Telephone Encounter (Signed)
LMOM

## 2015-12-04 NOTE — Telephone Encounter (Signed)
-----   Message from [name], MD sent at 12/03/2015  4:06 PM EDT ----- Please let this patient know that his MRI of the liver looks good. The lesion in question was consistent with a fatty growth and his noncancerous.  [name], MD

## 2015-12-06 ENCOUNTER — Ambulatory Visit (INDEPENDENT_AMBULATORY_CARE_PROVIDER_SITE_OTHER): Payer: Medicare Other | Admitting: Urology

## 2015-12-06 ENCOUNTER — Encounter: Payer: Self-pay | Admitting: Urology

## 2015-12-06 VITALS — BP 131/77 | HR 67 | Ht 67.0 in | Wt 131.0 lb

## 2015-12-06 DIAGNOSIS — K7689 Other specified diseases of liver: Secondary | ICD-10-CM

## 2015-12-06 DIAGNOSIS — C672 Malignant neoplasm of lateral wall of bladder: Secondary | ICD-10-CM

## 2015-12-06 DIAGNOSIS — K769 Liver disease, unspecified: Secondary | ICD-10-CM

## 2015-12-06 LAB — URINALYSIS, COMPLETE
Bilirubin, UA: NEGATIVE
KETONES UA: NEGATIVE
LEUKOCYTES UA: NEGATIVE
NITRITE UA: NEGATIVE
PH UA: 5.5 (ref 5.0–7.5)
SPEC GRAV UA: 1.02 (ref 1.005–1.030)
Urobilinogen, Ur: 0.2 mg/dL (ref 0.2–1.0)

## 2015-12-06 LAB — MICROSCOPIC EXAMINATION: Bacteria, UA: NONE SEEN

## 2015-12-06 MED ORDER — BCG LIVE 50 MG IS SUSR
3.2400 mL | Freq: Once | INTRAVESICAL | Status: AC
  Administered 2015-12-06: 81 mg via INTRAVESICAL

## 2015-12-06 NOTE — Progress Notes (Signed)
12/06/2015 9:29 AM   Colton Tyler 1926-02-19 [id number]  Referring provider: Kirk Ruths, MD [facility] [place], [place] [address]  Chief Complaint  Patient presents with  . Follow-up    BCG    HPI: Patient is an 82 year old Caucasian male who presents today for his 1st in a series of 6 weekly induction treatments of BCG.    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  He returned to the office on 10/07/2015 at which time there are multiple lesions within the bladder concerning for carcinoma in situ with positive urine cytology.  After a recent return to the operating room for a TURBT of a recurrent lesion on 11/05/15 post operatively, he was noted to have an at least 2 cm area of patchy erythema involving the right lateral bladder wall beyond the right UO couldn't suspicious for CIS. His bladder was heavily trabeculated. There are no obvious papillary lesions.  Surgical pathology was consistent with urothelial carcinoma in situ as well as high-grade invasive disease into the lamina propria. No muscularis propria was present on the biopsies.  The suspicious area was fulgurated completely.  Most recent imaging in the form of CT urogram on 10/18/2015 shows no clear evidence of metastatic disease. He does have a small liver lesion which is somewhat atypical for metastatic disease.  Today, he presents with his wife and daughter for his appointment.  He has read and signed the consent.  Questions are answered.  He states he has no urinary symptoms at this time.  He denies dysuria, gross hematuria and suprapubic pain.  He denies fevers, chills, nausea and vomiting.  He does not have a cough.  His UA today is significant for 6-10 WBC's and 3-10 RBC's.     Liver lesion in the left lobe was found to be consistent with a  fatty growth and his non cancerous.  PMH: Past Medical History:  Diagnosis Date  . Bladder cancer ([place])   . Chronic kidney disease   . Coronary artery disease   . Dementia   . Diabetes ([place])   . Dysrhythmia   . GERD (gastroesophageal reflux disease)   . Hematuria, gross 07/23/2014  . Hyperlipidemia   . Hypertension   . Pancreatic cancer [facility])    s/p whipple procedure    Surgical History: Past Surgical History:  Procedure Laterality Date  . BLADDER SURGERY    . CYSTOSCOPY WITH BIOPSY N/A 11/05/2015   Procedure: CYSTOSCOPY WITH BIOPSY;  Surgeon: [name], MD;  Location: [facility] ORS;  Service: Urology;  Laterality: N/A;  . CYSTOSCOPY WITH FULGERATION  11/05/2015   Procedure: CYSTOSCOPY WITH FULGERATION;  Surgeon: [name], MD;  Location: [facility] ORS;  Service: Urology;;  . HERNIA REPAIR Left    Inguinal Hernia Repair  . PORTACATH PLACEMENT Right 2015   [facility]  . WHIPPLE PROCEDURE    . WHIPPLE PROCEDURE      Home Medications:    Medication List       Accurate as of 12/06/15  9:29 AM. Always use your most recent med list.          amLODipine 10 MG tablet Commonly known as:  NORVASC Take 10 mg by mouth daily.   aspirin 81 MG tablet Take 81 mg by mouth daily.   LANTUS SOLOSTAR 100 UNIT/ML Solostar Pen Generic drug:  Insulin Glargine Inject into the skin.   NAMENDA 10 MG tablet Generic drug:  memantine Take 10 mg by mouth daily.   simvastatin 20 MG tablet Commonly known as:  ZOCOR Take 20 mg by mouth daily at 12 noon.   sitaGLIPtin 25 MG tablet Commonly known as:  JANUVIA Take 25 mg by mouth daily at 12 noon.       Allergies:  Allergies  Allergen Reactions  . Penicillins Other (See Comments)    "unknown"    Family History: Family History  Problem Relation Age of Onset  . Cerebrovascular Accident Father   . Stomach cancer Sister     Social History:  reports that he has never smoked. He has never used smokeless tobacco. He reports that he  does not drink alcohol or use drugs.  ROS: UROLOGY Frequent Urination?: No Hard to postpone urination?: No Burning/pain with urination?: No Get up at night to urinate?: No Leakage of urine?: No Urine stream starts and stops?: No Trouble starting stream?: No Do you have to strain to urinate?: No Blood in urine?: No Urinary tract infection?: No Sexually transmitted disease?: No Injury to kidneys or bladder?: No Painful intercourse?: No Weak stream?: No Erection problems?: No Penile pain?: No  Gastrointestinal Nausea?: No Vomiting?: No Indigestion/heartburn?: No Diarrhea?: No Constipation?: No  Constitutional Fever: No Night sweats?: No Weight loss?: No Fatigue?: No  Skin Skin rash/lesions?: No Itching?: No  Eyes Blurred vision?: No Double vision?: No  Ears/Nose/Throat Sore throat?: No Sinus problems?: No  Hematologic/Lymphatic Swollen glands?: No Easy bruising?: No  Cardiovascular Leg swelling?: No Chest pain?: No  Respiratory Cough?: No Shortness of breath?: No  Endocrine Excessive thirst?: No  Musculoskeletal Back pain?: No Joint pain?: No  Neurological Headaches?: No Dizziness?: No  Psychologic Depression?: No Anxiety?: No  Physical Exam: BP 131/77   Pulse 67   Ht 5\' 7"  (1.702 m)   Wt 131 lb (59.4 kg)   BMI 20.52 kg/m   Constitutional:  Alert and oriented, No acute distress. Elderly, appears slightly younger than stated age. Attended by daughter and wife today. HEENT: [place] AT, moist mucus membranes.  Trachea midline, no masses. Cardiovascular: No clubbing, cyanosis, or edema. Respiratory: Normal respiratory effort, no increased work of breathing. GI: Abdomen is soft, nontender, nondistended, no abdominal masses GU: No CVA tenderness.  Skin: No rashes, bruises or suspicious lesions. Neurologic: Grossly intact, no focal deficits, moving all 4 extremities. Psychiatric: Normal mood and affect.  Laboratory Data: Lab Results    Component Value Date   WBC 4.6 10/10/2015   HGB 12.0 (L) 10/10/2015   HCT 35.6 (L) 10/10/2015   MCV 90.7 10/10/2015   PLT 108 (L) 10/10/2015    Lab Results  Component Value Date   CREATININE 1.30 (H) 12/03/2015    Pertinent Imaging: CLINICAL DATA:  History of pancreas cancer.  Evaluate liver lesion.  EXAM: MRI ABDOMEN WITHOUT AND WITH CONTRAST  TECHNIQUE: Multiplanar multisequence MR imaging of the abdomen was performed both before and after the administration of intravenous contrast.  CONTRAST:  48mL MULTIHANCE GADOBENATE DIMEGLUMINE 529 MG/ML IV SOLN  COMPARISON:  None.  FINDINGS: Lower chest: No acute findings.  Hepatobiliary: There is an area of signal dropout on the out of phase sequence within the left lobe of liver which measures 3.9 cm, image number 10 of series 7. This corresponds to the area of abnormal low attenuation within the lateral segment of left lobe of liver described on study from 10/18/2015. Within the posterior right  lobe of liver there is a 1.6 cm arterial phase enhancing structure, image 24 of series 13. There is no corresponding abnormality on the precontrast T2 weighted sequences. This was likely present on the exam from 06/21/2013 and is favored to represent a benign abnormality such is a flash fill hemangioma or vascular abnormality.  Pancreas: Postoperative appearance of the pancreas compatible with Whipple procedure. Diffuse atrophy of the body and tail of pancreas.  Spleen:  Within normal limits in size and appearance.  Adrenals/Urinary Tract: Normal appearance of the adrenal glands. Bilateral renal cortical volume loss is identified. There is a cyst arising from the upper pole the right kidney measuring 11 mm. Several small cysts are noted in the left kidney. No mass or hydronephrosis.  Stomach/Bowel: Postsurgical anatomy of the stomach and small bowel loops compatible with previous Whipple procedure. No  pathologic dilatation of the large or small bowel loops.  Vascular/Lymphatic: Aortic atherosclerosis is noted. No aneurysm. There is no upper abdominal adenopathy identified.  Other:  No ascites identified within the upper abdomen.  Musculoskeletal: No abnormal signal identified within the bone marrow.  IMPRESSION: 1. The attenuation abnormality identified within the lateral segment of left lobe of liver on study from 10/18/2015 corresponds to a area of focal fatty deposition. This is a benign abnormality in requires no further follow-up. 2. Arterial phase enhancing structure within the posterior right lobe of liver was likely present on study from 02/23/2014. This is favored to represent benign abnormalities such is a perfusion anomaly or flash fill hemangioma. In light of the patient's increased risk for liver metastases a followup examination in 6 months with repeat contrast enhanced MRI of the upper abdomen is advised. 3. Stable postoperative appearance of the pancreas and upper GI tract compatible with previous Whipple procedure.   Electronically Signed   By: Kerby Moors M.D.   On: 12/03/2015 14:40   Assessment & Plan:    1. Malignant neoplasm of lateral wall of urinary bladder (HCC)  - History of T2 TCC s/p chemo/ rads 2015  - Recurrence of Hg T1/ CIS on recent TURBT, pathology reviewed with patient  - No other evidence of metastatic diease on CT abd/pelvis  - consent is signed  - # 1 of 6 BCG given today, patient tolerated the procedure  - instructed to pour bleach in the toilet after voids for the next 6 hours  - Advised to contact our office or seek treatment in the ED if becomes febrile, develops a new cough, rigors, difficulty with urination or pain/ vomiting that are difficult control in order to arrange for emergent/urgent intervention  2. Liver lesion, left lobe  - non cancerous   Return in about 1 week (around 12/13/2015) for # 2 BCG.  Zara Council, PA-C  [facility] [address], [place], [place] [address] [phone]  I spent 25 min with this patient of which greater than 50% was spent in counseling and coordination of care with the patient.

## 2015-12-06 NOTE — Progress Notes (Signed)
BCG Bladder Instillation  BCG # 1  Due to Bladder Cancer patient is present today for a BCG treatment. Patient was cleaned and prepped in a sterile fashion with betadine.  A 16FR catheter was inserted, urine return was noted 60ml, urine was yellow in color.  48ml of reconstituted BCG was instilled into the bladder. The catheter was then removed. Patient tolerated well, no complications were noted  Preformed by: Zara Council PA-C, Elberta Leatherwood, CMA  Follow up/ Additional notes: 1 week BCG #2

## 2015-12-06 NOTE — Telephone Encounter (Signed)
Pt was made aware of MRI results at [facility] today.

## 2015-12-13 ENCOUNTER — Other Ambulatory Visit: Payer: Self-pay | Admitting: *Deleted

## 2015-12-13 DIAGNOSIS — C679 Malignant neoplasm of bladder, unspecified: Secondary | ICD-10-CM

## 2015-12-15 NOTE — Progress Notes (Unsigned)
[facility] day:  12/15/15   Chief Complaint: Colton Tyler is an 82 y.o. male with bladder cancer who is seen for 9 month assessment.  HPI: The patient was last seen in the medical oncology clinic on 03/05/2015. At that time, he felt good.  He denied any hematuria.  His memory was poor.  Exam was unremarkable.  Hematocrit was 38.2.  Platelet count was 114,000.  SPEP was negative.  Free light chain ratio was normal.  B12 was 528 and folate 38.0.  Ferritin was 138 with normal iron studies.  CBC on 05/31/2015 revealed a hematocrit of 37.7, hemoglobin 12.7, MCV 90.9, platelets 103,000, WBC 5200 with an ANC of 3700.  Cystoscopy on 10/07/2015 revealed multiple lesions within the bladder concerning for carcinoma in situ with positive urine cytology.  CT urogram on 10/18/2015 revealed a new ill-defined mass in segment 3 of the liver.  MRI was recommended.  The pancreas appears stable status post Whipple procedure.  There was no other evidence of metastatic disease.  There was wall thickening of the transverse colon, also possibly related to prior radiation therapy. There was generalized bladder wall thickening and trabeculation without apparent focal lesion.  He underwent cystoscopy on 11/05/2015.  He was noted to have an at least a 2 cm area of patchy erythema involving the right lateral bladder wall beyond the right UO.  His bladder was heavily trabeculated. There are no obvious papillary lesions.  The suspicious area was fulgurated completely.  Pathology revealed invasive urothelial carcinoma invading the superficial lamina propria (stage T1).  No mucularis propria was present.  There was also urothelial carcinoma in situ.  Post procedure, he had gross hematuria.    Liver MRI on 12/03/2015 confirmed that the attenuation abnormality identified within the lateral segment of left lobe of liver on study from 10/18/2015 corresponded to a area of focal fatty  deposition.  An arterial phase enhancing structure within the posterior right lobe of liver was likely present on study from 02/23/2014. This was favored to represent benign abnormalities such is a perfusion anomaly or flash fill hemangioma. In light of the patient's increased risk for liver metastases a followup examination in 6 months with repeat contrast enhanced MRI of the upper abdomen was recommended.  There were stable postoperative appearance of the pancreas and upper GI tract compatible with previous Whipple procedure.  He is scheduled to receive induction and maintenance BCG treatment.    Past Medical History:  Diagnosis Date  . Bladder cancer ([place])   . Chronic kidney disease   . Coronary artery disease   . Dementia   . Diabetes ([place])   . Dysrhythmia   . GERD (gastroesophageal reflux disease)   . Hematuria, gross 07/23/2014  . Hyperlipidemia   . Hypertension   . Pancreatic cancer [facility])    s/p whipple procedure   Past Surgical History:  Procedure Laterality Date  . BLADDER SURGERY    . CYSTOSCOPY WITH BIOPSY N/A 11/05/2015   Procedure: CYSTOSCOPY WITH BIOPSY;  Surgeon: [name], MD;  Location: [facility] ORS;  Service: Urology;  Laterality: N/A;  . CYSTOSCOPY WITH FULGERATION  11/05/2015   Procedure: CYSTOSCOPY WITH FULGERATION;  Surgeon: [name], MD;  Location: [facility] ORS;  Service: Urology;;  . HERNIA REPAIR Left    Inguinal Hernia Repair  . PORTACATH PLACEMENT Right 2015   [facility]  . WHIPPLE PROCEDURE    . WHIPPLE PROCEDURE     Family History  Problem Relation  Age of Onset  . Cerebrovascular Accident Father   . Stomach cancer Sister    Social History:  reports that he has never smoked. He has never used smokeless tobacco. He reports that he does not drink alcohol or use drugs.  The patient is accompanied by his daughter, Colton Tyler.  Allergies:  Allergies  Allergen Reactions  . Penicillins Other (See Comments)    "unknown"   Current Medications: Current  Outpatient Prescriptions  Medication Sig Dispense Refill  . amLODipine (NORVASC) 10 MG tablet Take 10 mg by mouth daily.     Marland Kitchen aspirin 81 MG tablet Take 81 mg by mouth daily.     . Insulin Glargine (LANTUS SOLOSTAR) 100 UNIT/ML Solostar Pen Inject into the skin.    . memantine (NAMENDA) 10 MG tablet Take 10 mg by mouth daily.     . simvastatin (ZOCOR) 20 MG tablet Take 20 mg by mouth daily at 12 noon.     . sitaGLIPtin (JANUVIA) 25 MG tablet Take 25 mg by mouth daily at 12 noon.      No current facility-administered medications for this visit.    Facility-Administered Medications Ordered in Other Visits  Medication Dose Route Frequency Provider Last Rate Last Dose  . sodium chloride 0.9 % injection 10 mL  10 mL Intravenous PRN Forest Gleason, MD   10 mL at 07/27/14 1136   Review of Systems:  GENERAL:  Feels fine.  No fevers, sweats or weight loss.  Weight stable. PERFORMANCE STATUS (ECOG):  1 HEENT:  No visual changes, runny nose, sore throat, mouth sores or tenderness. Lungs: No shortness of breath or cough.  No hemoptysis. Cardiac:  No chest pain, palpitations, orthopnea, or PND. GI:  No nausea, vomiting, diarrhea, constipation, melena or hematochezia. GU:  No urgency, frequency, dysuria, or hematuria. Musculoskeletal:  No bone pain.  No joint pain.  No muscle tenderness. Extremities:  No pain or swelling. Skin:  No rashes or skin changes.  No bruising or bleeding. Neuro:  No headache, numbness or weakness, balance or coordination issues. Endocrine:  Diabetes.  No thyroid issues, hot flashes or night sweats. Psych:  No mood changes, depression or anxiety. Pain:  No focal pain. Review of systems:  All other systems reviewed and found to be negative.   Physical Exam: There were no vitals taken for this visit. GENERAL:  Well developed, well nourished, sitting comfortably in the exam room in no acute distress. MENTAL STATUS:  Alert and oriented to person, place and time. HEAD:  White  hair.  Male pattern baldness with sun induced changes to scalp.  Normocephalic, atraumatic, face symmetric, no Cushingoid features. EYES:  Glasses.  Blue eyes.  Pupils equal round and reactive to light and accomodation.  No conjunctivitis or scleral icterus. ENT:  Oropharynx clear without lesion.  Tongue normal. Mucous membranes moist.  RESPIRATORY:  Clear to auscultation without rales, wheezes or rhonchi. CARDIOVASCULAR:  Regular rate and rhythm without murmur, rub or gallop. ABDOMEN:  Soft, non-tender, with active bowel sounds, and no hepatosplenomegaly.  No masses. SKIN:  No rashes, ulcers or lesions. EXTREMITIES: No edema, no skin discoloration or tenderness.  No palpable cords. LYMPH NODES: No palpable cervical, supraclavicular, axillary or inguinal adenopathy  NEUROLOGICAL: Unremarkable. PSYCH:  Appropriate.   No visits with results within 3 Day(s) from this visit.  Latest known visit with results is:  Office Visit on 12/06/2015  Component Date Value Ref Range Status  . Specific Gravity, UA 12/06/2015 1.020  1.005 - 1.030 Final  .  pH, UA 12/06/2015 5.5  5.0 - 7.5 Final  . Color, UA 12/06/2015 Yellow  Yellow Final  . Appearance Ur 12/06/2015 Cloudy* Clear Final  . Leukocytes, UA 12/06/2015 Negative  Negative Final  . Protein, UA 12/06/2015 1+* Negative/Trace Final  . Glucose, UA 12/06/2015 1+* Negative Final  . Ketones, UA 12/06/2015 Negative  Negative Final  . RBC, UA 12/06/2015 Trace* Negative Final  . Bilirubin, UA 12/06/2015 Negative  Negative Final  . Urobilinogen, Ur 12/06/2015 0.2  0.2 - 1.0 mg/dL Final  . Nitrite, UA 12/06/2015 Negative  Negative Final  . Microscopic Examination 12/06/2015 See below:   Final  . WBC, UA 12/06/2015 6-10* 0 - 5 /hpf Final  . RBC, UA 12/06/2015 3-10* 0 - 2 /hpf Final  . Epithelial Cells (non renal) 12/06/2015 0-10  0 - 10 /hpf Final  . Bacteria, UA 12/06/2015 None seen  None seen/Few Final   Assessment:  SHANN HODGKINSON is an 82 y.o. male  with a history of clinical stage II (T2NxM0) bladder cancer.  He underwent a TURBT on 08/14/2013.  Pathology revealed a 3 cm high grade base of bladder tumor which invaded the muscularis propria.  There was lymphovascular invasion.   He received 7 weekly cycles of carboplatin (10/03/2013 - 11/21/2013) with radiation (10/17/2013 - 12/08/2013).  Chest, abdomen, and pelvic CT scan on 02/23/2014 revealed no evidence of metastic disease.  He underwent cystoscopy in 04/2014 per his wife (unknown results).  He has a history of pancreatic cancer in the 1960s/1970s.  He underwent Whipple.  He has had no evidence of recurrent disease.  Chest, abdomen, and pelvic CT scan on 11/09/2014 revealed no evidence of recurrent disease. He has diverticulosis.  He was on oral iron TID for some time likely secondary to initial hematuria.  Iron was discontinued on 04/27/2014.  Diet is good.  His last colonoscopy was 4 years ago.  He denies any melena or hematochezia.  Iron stores are good.  He has chronic kidney disease (creatinine clearance 35 ml/min).  He has mild thrombocytopenia since 07/2013 of unclear etiology.  Platelet count has ranged from 65,000 - 134,000 without trend.  He is on no new medications or herbal products.  He denies any bruising or bleeding.  Symptomatically, he feels good.  He denies any hematuria.  His memory is poor.  Exam is unremarkable.  Hematocrit is improving.  Platelet count is 114,000.  Plan: 1. Labs today:  CBC with diff, CMP, ferritin. 2.  3. Port flush every 6-8 weeks (due now). 4. RTC in 3 months for labs (CBC with diff). 5. RTC in 6 months for MD assessment and labs (CBC with diff, CMP, +/- others).   Lequita Asal, MD  12/15/2015, 7:22 AM

## 2015-12-16 ENCOUNTER — Inpatient Hospital Stay: Payer: Medicare Other | Admitting: Hematology and Oncology

## 2015-12-16 ENCOUNTER — Encounter: Payer: Self-pay | Admitting: Urology

## 2015-12-16 ENCOUNTER — Inpatient Hospital Stay: Payer: Medicare Other

## 2015-12-16 ENCOUNTER — Ambulatory Visit (INDEPENDENT_AMBULATORY_CARE_PROVIDER_SITE_OTHER): Payer: Medicare Other | Admitting: Urology

## 2015-12-16 VITALS — BP 134/73 | HR 60 | Ht 68.0 in | Wt 129.9 lb

## 2015-12-16 DIAGNOSIS — C679 Malignant neoplasm of bladder, unspecified: Secondary | ICD-10-CM | POA: Diagnosis not present

## 2015-12-16 LAB — URINALYSIS, COMPLETE
Bilirubin, UA: NEGATIVE
Ketones, UA: NEGATIVE
Leukocytes, UA: NEGATIVE
NITRITE UA: NEGATIVE
RBC, UA: NEGATIVE
Specific Gravity, UA: 1.02 (ref 1.005–1.030)
Urobilinogen, Ur: 0.2 mg/dL (ref 0.2–1.0)
pH, UA: 5.5 (ref 5.0–7.5)

## 2015-12-16 LAB — MICROSCOPIC EXAMINATION: Bacteria, UA: NONE SEEN

## 2015-12-16 MED ORDER — BCG LIVE 50 MG IS SUSR
3.2400 mL | Freq: Once | INTRAVESICAL | Status: AC
  Administered 2015-12-16: 81 mg via INTRAVESICAL

## 2015-12-16 MED ORDER — LIDOCAINE HCL 2 % EX GEL
1.0000 "application " | Freq: Once | CUTANEOUS | Status: AC
  Administered 2015-12-16: 1 via URETHRAL

## 2015-12-16 NOTE — Progress Notes (Signed)
BCG Bladder Instillation  BCG # 2  Due to Bladder Cancer patient is present today for a BCG treatment. Patient was cleaned and prepped in a sterile fashion with betadine and lidocaine 2% jelly was instilled into the urethra.  A 14FR catheter was inserted, urine return was noted 60ml, urine was yellow in color.  14ml of reconstituted BCG was instilled into the bladder. The catheter was then removed. Patient tolerated well, no complications were noted  Preformed by: Zara Council PA-C and Lyndee Hensen CMA  Follow up/ Additional notes: One Week

## 2015-12-16 NOTE — Progress Notes (Signed)
12/16/2015 4:35 PM   Colton Tyler 1926/01/09 PO:[id number]  Referring provider: Kirk Ruths, MD [facility] [place], [place] [address]  Chief Complaint  Patient presents with  . Bladder Cancer    BCG 2     HPI: Patient is an 82 year old Caucasian male who presents today for his 2nd in a series of 6 weekly induction treatments of BCG.    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  He returned to the office on 10/07/2015 at which time there are multiple lesions within the bladder concerning for carcinoma in situ with positive urine cytology.  After a recent return to the operating room for a TURBT of a recurrent lesion on 11/05/15 post operatively, he was noted to have an at least 2 cm area of patchy erythema involving the right lateral bladder wall beyond the right UO couldn't suspicious for CIS. His bladder was heavily trabeculated. There are no obvious papillary lesions.  Surgical pathology was consistent with urothelial carcinoma in situ as well as high-grade invasive disease into the lamina propria. No muscularis propria was present on the biopsies.  The suspicious area was fulgurated completely.  Most recent imaging in the form of CT urogram on 10/18/2015 shows no clear evidence of metastatic disease. He does have a small liver lesion which is somewhat atypical for metastatic disease.  Today, he states he has no urinary symptoms at this time.  He denies dysuria, gross hematuria and suprapubic pain.  He denies fevers, chills, nausea and vomiting.  He does not have a cough.  His UA today is significant for 11-30 WBC's and 0-2 RBC's.  He states he was able to hold the BCG solution for two hours after his initial treatment.   Liver lesion in the left lobe was found to be consistent with a fatty growth and his non  cancerous.  PMH: Past Medical History:  Diagnosis Date  . Bladder cancer ([place])   . Chronic kidney disease   . Coronary artery disease   . Dementia   . Diabetes ([place])   . Dysrhythmia   . GERD (gastroesophageal reflux disease)   . Hematuria, gross 07/23/2014  . Hyperlipidemia   . Hypertension   . Pancreatic cancer [facility])    s/p whipple procedure    Surgical History: Past Surgical History:  Procedure Laterality Date  . BLADDER SURGERY    . CYSTOSCOPY WITH BIOPSY N/A 11/05/2015   Procedure: CYSTOSCOPY WITH BIOPSY;  Surgeon: [name], MD;  Location: [facility] ORS;  Service: Urology;  Laterality: N/A;  . CYSTOSCOPY WITH FULGERATION  11/05/2015   Procedure: CYSTOSCOPY WITH FULGERATION;  Surgeon: [name], MD;  Location: [facility] ORS;  Service: Urology;;  . HERNIA REPAIR Left    Inguinal Hernia Repair  . PORTACATH PLACEMENT Right 2015   [facility]  . WHIPPLE PROCEDURE    . WHIPPLE PROCEDURE      Home Medications:    Medication List       Accurate as of 12/16/15  4:35 PM. Always use your most recent med list.          amLODipine 10 MG tablet Commonly known as:  NORVASC Take 10 mg by mouth daily.   aspirin 81 MG tablet Take 81 mg by mouth daily.   LANTUS SOLOSTAR 100 UNIT/ML Solostar Pen Generic drug:  Insulin Glargine Inject into  the skin.   NAMENDA 10 MG tablet Generic drug:  memantine Take 10 mg by mouth daily.   simvastatin 20 MG tablet Commonly known as:  ZOCOR Take 20 mg by mouth daily at 12 noon.   sitaGLIPtin 25 MG tablet Commonly known as:  JANUVIA Take 25 mg by mouth daily at 12 noon.       Allergies:  Allergies  Allergen Reactions  . Penicillins Other (See Comments)    "unknown"    Family History: Family History  Problem Relation Age of Onset  . Cerebrovascular Accident Father   . Stomach cancer Sister   . Kidney disease Neg Hx   . Prostate cancer Neg Hx     Social History:  reports that he has never smoked. He has never used smokeless  tobacco. He reports that he does not drink alcohol or use drugs.  ROS: UROLOGY Frequent Urination?: No Hard to postpone urination?: No Burning/pain with urination?: No Get up at night to urinate?: No Leakage of urine?: No Urine stream starts and stops?: No Trouble starting stream?: No Do you have to strain to urinate?: No Blood in urine?: No Urinary tract infection?: No Sexually transmitted disease?: No Injury to kidneys or bladder?: No Painful intercourse?: No Weak stream?: No Erection problems?: No Penile pain?: No  Gastrointestinal Nausea?: No Vomiting?: No Indigestion/heartburn?: No Diarrhea?: No Constipation?: No  Constitutional Fever: No Night sweats?: No Weight loss?: No Fatigue?: No  Skin Skin rash/lesions?: No Itching?: No  Eyes Blurred vision?: No Double vision?: No  Ears/Nose/Throat Sore throat?: No Sinus problems?: No  Hematologic/Lymphatic Swollen glands?: No Easy bruising?: No  Cardiovascular Leg swelling?: No Chest pain?: No  Respiratory Cough?: No Shortness of breath?: No  Endocrine Excessive thirst?: No  Musculoskeletal Back pain?: No Joint pain?: No  Neurological Headaches?: No Dizziness?: No  Psychologic Depression?: No Anxiety?: No  Physical Exam: BP 134/73   Pulse 60   Ht 5\' 8"  (1.727 m)   Wt 129 lb 14.4 oz (58.9 kg)   BMI 19.75 kg/m   Constitutional:  Alert and oriented, No acute distress. Elderly, appears slightly younger than stated age. Attended by daughter and wife today. HEENT: [place] AT, moist mucus membranes.  Trachea midline, no masses. Cardiovascular: No clubbing, cyanosis, or edema. Respiratory: Normal respiratory effort, no increased work of breathing. GI: Abdomen is soft, nontender, nondistended, no abdominal masses GU: No CVA tenderness.  Skin: No rashes, bruises or suspicious lesions. Neurologic: Grossly intact, no focal deficits, moving all 4 extremities. Psychiatric: Normal mood and  affect.  Laboratory Data: Lab Results  Component Value Date   WBC 4.6 10/10/2015   HGB 12.0 (L) 10/10/2015   HCT 35.6 (L) 10/10/2015   MCV 90.7 10/10/2015   PLT 108 (L) 10/10/2015    Lab Results  Component Value Date   CREATININE 1.30 (H) 12/03/2015      Assessment & Plan:    1. Malignant neoplasm of lateral wall of urinary bladder (HCC)  - History of T2 TCC s/p chemo/ rads 2015  - Recurrence of Hg T1/ CIS on recent TURBT, pathology reviewed with patient  - No other evidence of metastatic diease on CT abd/pelvis  - consent is signed  - # 2 of 6 BCG given today, patient tolerated the procedure  - instructed to pour bleach in the toilet after voids for the next 6 hours  - he will RTC in one week for his 3rd BCG  - Advised to contact our office or seek treatment in  the ED if becomes febrile, develops a new cough, rigors, difficulty with urination or pain/ vomiting that are difficult control in order to arrange for emergent/urgent intervention  2. Liver lesion, left lobe  - non cancerous   Return in about 1 week (around 12/23/2015) for # 3 BCG.  Zara Council, PA-C  [facility] [address], [place], [place] [address] [phone]  I spent 25 min with this patient of which greater than 50% was spent in counseling and coordination of care with the patient.

## 2015-12-16 NOTE — Addendum Note (Signed)
Addended by: Orlene Erm on: 12/16/2015 04:44 PM   Modules accepted: Orders

## 2015-12-23 ENCOUNTER — Ambulatory Visit (INDEPENDENT_AMBULATORY_CARE_PROVIDER_SITE_OTHER): Payer: Medicare Other | Admitting: Urology

## 2015-12-23 ENCOUNTER — Encounter: Payer: Self-pay | Admitting: Urology

## 2015-12-23 VITALS — BP 155/62 | HR 73 | Ht 67.0 in | Wt 132.9 lb

## 2015-12-23 DIAGNOSIS — C679 Malignant neoplasm of bladder, unspecified: Secondary | ICD-10-CM

## 2015-12-23 LAB — URINALYSIS, COMPLETE
Bilirubin, UA: NEGATIVE
KETONES UA: NEGATIVE
LEUKOCYTES UA: NEGATIVE
NITRITE UA: NEGATIVE
Protein, UA: NEGATIVE
SPEC GRAV UA: 1.01 (ref 1.005–1.030)
Urobilinogen, Ur: 0.2 mg/dL (ref 0.2–1.0)
pH, UA: 5 (ref 5.0–7.5)

## 2015-12-23 LAB — MICROSCOPIC EXAMINATION: Bacteria, UA: NONE SEEN

## 2015-12-23 MED ORDER — BCG LIVE 50 MG IS SUSR
3.2400 mL | Freq: Once | INTRAVESICAL | Status: AC
  Administered 2015-12-23: 81 mg via INTRAVESICAL

## 2015-12-23 MED ORDER — LIDOCAINE HCL 2 % EX GEL
1.0000 "application " | Freq: Once | CUTANEOUS | Status: AC
  Administered 2015-12-23: 1 via URETHRAL

## 2015-12-23 NOTE — Progress Notes (Signed)
BCG Bladder Instillation  BCG # 3  Due to Bladder Cancer patient is present today for a BCG treatment. Patient was cleaned and prepped in a sterile fashion with betadine and lidocaine 2% jelly was instilled into the urethra.  A 14FR Cou[place] catheter was inserted, urine return was noted 37ml, urine was yellow in color.  9ml of reconstituted BCG was instilled into the bladder. The catheter was then removed. Patient tolerated well, no complications were noted.  Preformed by: Zara Council PA-C and Lyndee Hensen CMA  Follow up/ Additional notes: One week

## 2015-12-23 NOTE — Progress Notes (Signed)
12/23/2015 2:33 PM   Colton Tyler [birth year] PO:[id number]  Referring provider: Kirk Ruths, MD [facility] [place], [place] [address]  Chief Complaint  Patient presents with  . Bladder Cancer    BCG 3    HPI: Patient is an 82 year old Caucasian male who presents today for his 3rd in a series of 6 weekly induction treatments of BCG.    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  He returned to the office on 10/07/2015 at which time there are multiple lesions within the bladder concerning for carcinoma in situ with positive urine cytology.  After a recent return to the operating room for a TURBT of a recurrent lesion on 11/05/15 post operatively, he was noted to have an at least 2 cm area of patchy erythema involving the right lateral bladder wall beyond the right UO couldn't suspicious for CIS. His bladder was heavily trabeculated. There are no obvious papillary lesions.  Surgical pathology was consistent with urothelial carcinoma in situ as well as high-grade invasive disease into the lamina propria. No muscularis propria was present on the biopsies.  The suspicious area was fulgurated completely.  Most recent imaging in the form of CT urogram on 10/18/2015 shows no clear evidence of metastatic disease. He does have a small liver lesion which is somewhat atypical for metastatic disease.  Today, he states he has no urinary symptoms at this time.  He denies dysuria, gross hematuria and suprapubic pain.  He denies fevers, chills, nausea and vomiting.  He does not have a cough.  His UA today is significant for 0-5 WBC's and 3-10 RBC's.  He states he was able to hold the BCG solution for two hours after his initial treatment.     PMH: Past Medical History:  Diagnosis Date  . Bladder cancer ([place])   . Chronic kidney  disease   . Coronary artery disease   . Dementia   . Diabetes ([place])   . Dysrhythmia   . GERD (gastroesophageal reflux disease)   . Hematuria, gross 07/23/2014  . Hyperlipidemia   . Hypertension   . Pancreatic cancer [facility])    s/p whipple procedure    Surgical History: Past Surgical History:  Procedure Laterality Date  . BLADDER SURGERY    . CYSTOSCOPY WITH BIOPSY N/A 11/05/2015   Procedure: CYSTOSCOPY WITH BIOPSY;  Surgeon: [name], MD;  Location: [facility] ORS;  Service: Urology;  Laterality: N/A;  . CYSTOSCOPY WITH FULGERATION  11/05/2015   Procedure: CYSTOSCOPY WITH FULGERATION;  Surgeon: [name], MD;  Location: [facility] ORS;  Service: Urology;;  . HERNIA REPAIR Left    Inguinal Hernia Repair  . PORTACATH PLACEMENT Right 2015   [facility]  . WHIPPLE PROCEDURE    . WHIPPLE PROCEDURE      Home Medications:    Medication List       Accurate as of 12/23/15  2:33 PM. Always use your most recent med list.          amLODipine 10 MG tablet Commonly known as:  NORVASC Take 10 mg by mouth daily.   aspirin 81 MG tablet Take 81 mg by mouth daily.   LANTUS SOLOSTAR 100 UNIT/ML Solostar Pen Generic drug:  Insulin Glargine Inject into the skin.   NAMENDA 10 MG tablet Generic drug:  memantine Take 10 mg by mouth daily.  simvastatin 20 MG tablet Commonly known as:  ZOCOR Take 20 mg by mouth daily at 12 noon.   sitaGLIPtin 25 MG tablet Commonly known as:  JANUVIA Take 25 mg by mouth daily at 12 noon.       Allergies:  Allergies  Allergen Reactions  . Penicillins Other (See Comments)    "unknown"    Family History: Family History  Problem Relation Age of Onset  . Cerebrovascular Accident Father   . Stomach cancer Sister   . Kidney disease Neg Hx   . Prostate cancer Neg Hx     Social History:  reports that he has never smoked. He has never used smokeless tobacco. He reports that he does not drink alcohol or use drugs.  ROS: UROLOGY Frequent Urination?:  No Hard to postpone urination?: No Burning/pain with urination?: No Get up at night to urinate?: No Leakage of urine?: No Urine stream starts and stops?: No Trouble starting stream?: No Do you have to strain to urinate?: No Blood in urine?: No Urinary tract infection?: No Sexually transmitted disease?: No Injury to kidneys or bladder?: No Painful intercourse?: No Weak stream?: No Erection problems?: No Penile pain?: No  Gastrointestinal Nausea?: No Vomiting?: No Indigestion/heartburn?: No Diarrhea?: No Constipation?: No  Constitutional Fever: No Night sweats?: No Weight loss?: No Fatigue?: No  Skin Skin rash/lesions?: No Itching?: No  Eyes Blurred vision?: No Double vision?: No  Ears/Nose/Throat Sore throat?: No Sinus problems?: No  Hematologic/Lymphatic Swollen glands?: No Easy bruising?: No  Cardiovascular Leg swelling?: No Chest pain?: No  Respiratory Cough?: No Shortness of breath?: No  Endocrine Excessive thirst?: No  Musculoskeletal Back pain?: No Joint pain?: No  Neurological Headaches?: No Dizziness?: No  Psychologic Depression?: No Anxiety?: No  Physical Exam: BP (!) 155/62   Pulse 73   Ht 5\' 7"  (1.702 m)   Wt 132 lb 14.4 oz (60.3 kg)   BMI 20.82 kg/m   Constitutional:  Alert and oriented, No acute distress. Elderly, appears slightly younger than stated age.  HEENT: [place] AT, moist mucus membranes.  Trachea midline, no masses. Cardiovascular: No clubbing, cyanosis, or edema. Respiratory: Normal respiratory effort, no increased work of breathing. GI: Abdomen is soft, nontender, nondistended, no abdominal masses GU: No CVA tenderness.  Skin: No rashes, bruises or suspicious lesions. Neurologic: Grossly intact, no focal deficits, moving all 4 extremities. Psychiatric: Normal mood and affect.  Laboratory Data: Lab Results  Component Value Date   WBC 4.6 10/10/2015   HGB 12.0 (L) 10/10/2015   HCT 35.6 (L) 10/10/2015   MCV  90.7 10/10/2015   PLT 108 (L) 10/10/2015    Lab Results  Component Value Date   CREATININE 1.30 (H) 12/03/2015    Assessment & Plan:    1. Malignant neoplasm of lateral wall of urinary bladder (HCC)  - History of T2 TCC s/p chemo/ rads 2015  - Recurrence of Hg T1/ CIS on recent TURBT, pathology reviewed with patient  - No other evidence of metastatic diease on CT abd/pelvis  - consent is signed  - # 3 of 6 BCG given today, patient tolerated the procedure  - instructed to pour bleach in the toilet after voids for the next 6 hours  - he will RTC in one week for his 4th BCG  - Advised to contact our office or seek treatment in the ED if becomes febrile, develops a new cough, rigors, difficulty with urination or pain/ vomiting that are difficult control in order to arrange for emergent/urgent  intervention   Return in about 1 week (around 12/30/2015) for # 4 BCG.  Zara Council, [facility] [address] [place], [place] [address] [phone]

## 2015-12-30 ENCOUNTER — Ambulatory Visit (INDEPENDENT_AMBULATORY_CARE_PROVIDER_SITE_OTHER): Payer: Medicare Other | Admitting: Urology

## 2015-12-30 ENCOUNTER — Encounter: Payer: Self-pay | Admitting: Urology

## 2015-12-30 VITALS — BP 131/67 | HR 56 | Ht 66.0 in | Wt 128.6 lb

## 2015-12-30 DIAGNOSIS — C679 Malignant neoplasm of bladder, unspecified: Secondary | ICD-10-CM | POA: Diagnosis not present

## 2015-12-30 LAB — URINALYSIS, COMPLETE
Bilirubin, UA: NEGATIVE
KETONES UA: NEGATIVE
LEUKOCYTES UA: NEGATIVE
Nitrite, UA: NEGATIVE
Protein, UA: NEGATIVE
SPEC GRAV UA: 1.01 (ref 1.005–1.030)
Urobilinogen, Ur: 0.2 mg/dL (ref 0.2–1.0)
pH, UA: 6.5 (ref 5.0–7.5)

## 2015-12-30 LAB — MICROSCOPIC EXAMINATION: Bacteria, UA: NONE SEEN

## 2015-12-30 MED ORDER — BCG LIVE 50 MG IS SUSR
3.2400 mL | Freq: Once | INTRAVESICAL | Status: AC
  Administered 2015-12-30: 81 mg via INTRAVESICAL

## 2015-12-30 NOTE — Progress Notes (Signed)
BCG Bladder Instillation  BCG # 4  Due to Bladder Cancer patient is present today for a BCG treatment. Patient was cleaned and prepped in a sterile fashion with betadine and lidocaine 2% jelly was instilled into the urethra.  A  14 FR catheter was inserted, urine return was noted  70ml, urine was  in color.  2ml of reconstituted BCG was instilled into the bladder. The catheter was then removed. Patient tolerated well, no complications were noted  Preformed by: [place]  Follow up/ Additional notes: 1 week

## 2015-12-30 NOTE — Progress Notes (Signed)
12/30/2015 2:19 PM   Colton Tyler February 16, 1926 PO:[id number]  Referring provider: Kirk Ruths, MD [facility] [place], [place] [address]  Chief Complaint  Patient presents with  . Follow-up    malignant urinary bladder     HPI: Patient is an 82 year old Caucasian male who presents today for his 4th in a series of 6 weekly induction treatments of BCG.    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  He returned to the office on 10/07/2015 at which time there are multiple lesions within the bladder concerning for carcinoma in situ with positive urine cytology.  After a recent return to the operating room for a TURBT of a recurrent lesion on 11/05/15 post operatively, he was noted to have an at least 2 cm area of patchy erythema involving the right lateral bladder wall beyond the right UO couldn't suspicious for CIS. His bladder was heavily trabeculated. There are no obvious papillary lesions.  Surgical pathology was consistent with urothelial carcinoma in situ as well as high-grade invasive disease into the lamina propria. No muscularis propria was present on the biopsies.  The suspicious area was fulgurated completely.  Most recent imaging in the form of CT urogram on 10/18/2015 shows no clear evidence of metastatic disease. He does have a small liver lesion which is somewhat atypical for metastatic disease.  Today, he states he has no urinary symptoms at this time.  He denies dysuria, gross hematuria and suprapubic pain.  He denies fevers, chills, nausea and vomiting.  He does not have a cough.  His UA today is significant for 6-10 WBC's and 3-10 RBC's.  He states he was able to hold the BCG solution for two hours after his initial treatment.   PMH: Past Medical History:  Diagnosis Date  . Bladder cancer ([place])   .  Chronic kidney disease   . Coronary artery disease   . Dementia   . Diabetes ([place])   . Dysrhythmia   . GERD (gastroesophageal reflux disease)   . Hematuria, gross 07/23/2014  . Hyperlipidemia   . Hypertension   . Pancreatic cancer [facility])    s/p whipple procedure    Surgical History: Past Surgical History:  Procedure Laterality Date  . BLADDER SURGERY    . CYSTOSCOPY WITH BIOPSY N/A 11/05/2015   Procedure: CYSTOSCOPY WITH BIOPSY;  Surgeon: [name], MD;  Location: [facility] ORS;  Service: Urology;  Laterality: N/A;  . CYSTOSCOPY WITH FULGERATION  11/05/2015   Procedure: CYSTOSCOPY WITH FULGERATION;  Surgeon: [name], MD;  Location: [facility] ORS;  Service: Urology;;  . HERNIA REPAIR Left    Inguinal Hernia Repair  . PORTACATH PLACEMENT Right 2015   [facility]  . WHIPPLE PROCEDURE    . WHIPPLE PROCEDURE      Home Medications:    Medication List       Accurate as of 12/30/15  2:19 PM. Always use your most recent med list.          amLODipine 10 MG tablet Commonly known as:  NORVASC Take 10 mg by mouth daily.   aspirin 81 MG tablet Take 81 mg by mouth daily.   LANTUS SOLOSTAR 100 UNIT/ML Solostar Pen Generic drug:  Insulin Glargine Inject into the skin.   NAMENDA 10 MG tablet Generic drug:  memantine Take 10 mg by mouth daily.  simvastatin 20 MG tablet Commonly known as:  ZOCOR Take 20 mg by mouth daily at 12 noon.   sitaGLIPtin 25 MG tablet Commonly known as:  JANUVIA Take 25 mg by mouth daily at 12 noon.       Allergies:  Allergies  Allergen Reactions  . Penicillins Other (See Comments)    "unknown"    Family History: Family History  Problem Relation Age of Onset  . Cerebrovascular Accident Father   . Stomach cancer Sister   . Kidney disease Neg Hx   . Prostate cancer Neg Hx     Social History:  reports that he has never smoked. He has never used smokeless tobacco. He reports that he does not drink alcohol or use  drugs.  ROS: UROLOGY Frequent Urination?: No Hard to postpone urination?: No Burning/pain with urination?: No Get up at night to urinate?: No Leakage of urine?: No Urine stream starts and stops?: No Trouble starting stream?: No Do you have to strain to urinate?: No Blood in urine?: No Urinary tract infection?: No Sexually transmitted disease?: No Injury to kidneys or bladder?: No Painful intercourse?: No Weak stream?: No Erection problems?: No Penile pain?: No  Gastrointestinal Nausea?: No Vomiting?: No Indigestion/heartburn?: No Diarrhea?: No Constipation?: No  Constitutional Fever: No Night sweats?: No Weight loss?: No Fatigue?: No  Skin Skin rash/lesions?: No Itching?: No  Eyes Blurred vision?: No Double vision?: No  Ears/Nose/Throat Sore throat?: No Sinus problems?: No  Hematologic/Lymphatic Swollen glands?: No Easy bruising?: No  Cardiovascular Leg swelling?: No Chest pain?: No  Respiratory Cough?: No Shortness of breath?: No  Endocrine Excessive thirst?: No  Musculoskeletal Back pain?: No Joint pain?: No  Neurological Headaches?: No Dizziness?: No  Psychologic Depression?: No Anxiety?: No  Physical Exam: BP 131/67   Pulse (!) 56   Ht 5\' 6"  (1.676 m)   Wt 128 lb 9.6 oz (58.3 kg)   BMI 20.76 kg/m   Constitutional:  Alert and oriented, No acute distress. Elderly, appears slightly younger than stated age.  HEENT: [place] AT, moist mucus membranes.  Trachea midline, no masses. Cardiovascular: No clubbing, cyanosis, or edema. Respiratory: Normal respiratory effort, no increased work of breathing. GI: Abdomen is soft, nontender, nondistended, no abdominal masses GU: No CVA tenderness.  Skin: No rashes, bruises or suspicious lesions. Neurologic: Grossly intact, no focal deficits, moving all 4 extremities. Psychiatric: Normal mood and affect.  Laboratory Data: Lab Results  Component Value Date   WBC 4.6 10/10/2015   HGB 12.0 (L)  10/10/2015   HCT 35.6 (L) 10/10/2015   MCV 90.7 10/10/2015   PLT 108 (L) 10/10/2015    Lab Results  Component Value Date   CREATININE 1.30 (H) 12/03/2015    Assessment & Plan:    1. Malignant neoplasm of lateral wall of urinary bladder (HCC)  - History of T2 TCC s/p chemo/ rads 2015  - Recurrence of Hg T1/ CIS on recent TURBT, pathology reviewed with patient  - No other evidence of metastatic diease on CT abd/pelvis  - consent is signed  - # 4 of 6 BCG given today, patient tolerated the procedure  - instructed to pour bleach in the toilet after voids for the next 6 hours  - he will RTC in one week for his 5th BCG  - Advised to contact our office or seek treatment in the ED if becomes febrile, develops a new cough, rigors, difficulty with urination or pain/ vomiting that are difficult control in order to arrange for emergent/urgent  intervention   Return in about 1 week (around 01/06/2016) for # 5 BCG.  Zara Council, [facility] [address] [place], [place] [address] [phone]

## 2016-01-06 ENCOUNTER — Ambulatory Visit (INDEPENDENT_AMBULATORY_CARE_PROVIDER_SITE_OTHER): Payer: Medicare Other | Admitting: Urology

## 2016-01-06 ENCOUNTER — Encounter: Payer: Self-pay | Admitting: Urology

## 2016-01-06 VITALS — BP 159/77 | HR 70 | Ht 66.0 in | Wt 128.2 lb

## 2016-01-06 DIAGNOSIS — C679 Malignant neoplasm of bladder, unspecified: Secondary | ICD-10-CM

## 2016-01-06 LAB — URINALYSIS, COMPLETE
BILIRUBIN UA: NEGATIVE
Leukocytes, UA: NEGATIVE
Nitrite, UA: NEGATIVE
PH UA: 5 (ref 5.0–7.5)
Protein, UA: NEGATIVE
Specific Gravity, UA: <1.005 — ABNORMAL LOW (ref 1.005–1.030)
UUROB: 0.2 mg/dL (ref 0.2–1.0)

## 2016-01-06 LAB — MICROSCOPIC EXAMINATION
BACTERIA UA: NONE SEEN
EPITHELIAL CELLS (NON RENAL): NONE SEEN /HPF (ref 0–10)

## 2016-01-06 MED ORDER — BCG LIVE 50 MG IS SUSR
3.2400 mL | Freq: Once | INTRAVESICAL | Status: AC
  Administered 2016-01-06: 81 mg via INTRAVESICAL

## 2016-01-06 NOTE — Progress Notes (Signed)
01/06/2016 2:48 PM   Colton Tyler 1925-11-29 PO:[id number]  Referring provider: Kirk Ruths, MD [facility] [place], [place] [address]  Chief Complaint  Patient presents with  . Follow-up    BCG #5    HPI: Patient is an 82 year old Caucasian male who presents today for his 5th in a series of 6 weekly induction treatments of BCG.    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  He returned to the office on 10/07/2015 at which time there are multiple lesions within the bladder concerning for carcinoma in situ with positive urine cytology.  After a recent return to the operating room for a TURBT of a recurrent lesion on 11/05/15 post operatively, he was noted to have an at least 2 cm area of patchy erythema involving the right lateral bladder wall beyond the right UO couldn't suspicious for CIS. His bladder was heavily trabeculated. There are no obvious papillary lesions.  Surgical pathology was consistent with urothelial carcinoma in situ as well as high-grade invasive disease into the lamina propria. No muscularis propria was present on the biopsies.  The suspicious area was fulgurated completely.  Most recent imaging in the form of CT urogram on 10/18/2015 shows no clear evidence of metastatic disease. He does have a small liver lesion which is somewhat atypical for metastatic disease.  Today, he states he has no urinary symptoms at this time.  He denies dysuria, gross hematuria and suprapubic pain.  He denies fevers, chills, nausea and vomiting.  He does not have a cough.  His UA today is significant for 6-10 WBC's and 3-10 RBC's.  He states he was able to hold the BCG solution for two hours after his initial treatment.   PMH: Past Medical History:  Diagnosis Date  . Bladder cancer ([place])   . Chronic kidney  disease   . Coronary artery disease   . Dementia   . Diabetes ([place])   . Dysrhythmia   . GERD (gastroesophageal reflux disease)   . Hematuria, gross 07/23/2014  . Hyperlipidemia   . Hypertension   . Pancreatic cancer [facility])    s/p whipple procedure    Surgical History: Past Surgical History:  Procedure Laterality Date  . BLADDER SURGERY    . CYSTOSCOPY WITH BIOPSY N/A 11/05/2015   Procedure: CYSTOSCOPY WITH BIOPSY;  Surgeon: [name], MD;  Location: [facility] ORS;  Service: Urology;  Laterality: N/A;  . CYSTOSCOPY WITH FULGERATION  11/05/2015   Procedure: CYSTOSCOPY WITH FULGERATION;  Surgeon: [name], MD;  Location: [facility] ORS;  Service: Urology;;  . HERNIA REPAIR Left    Inguinal Hernia Repair  . PORTACATH PLACEMENT Right 2015   [facility]  . WHIPPLE PROCEDURE    . WHIPPLE PROCEDURE      Home Medications:    Medication List       Accurate as of 01/06/16  2:48 PM. Always use your most recent med list.          amLODipine 10 MG tablet Commonly known as:  NORVASC Take 10 mg by mouth daily.   aspirin 81 MG tablet Take 81 mg by mouth daily.   LANTUS SOLOSTAR 100 UNIT/ML Solostar Pen Generic drug:  Insulin Glargine Inject into the skin.   NAMENDA 10 MG tablet Generic drug:  memantine Take 10 mg by mouth daily.   simvastatin 20  MG tablet Commonly known as:  ZOCOR Take 20 mg by mouth daily at 12 noon.   sitaGLIPtin 25 MG tablet Commonly known as:  JANUVIA Take 25 mg by mouth daily at 12 noon.       Allergies:  Allergies  Allergen Reactions  . Penicillins Other (See Comments)    "unknown"    Family History: Family History  Problem Relation Age of Onset  . Cerebrovascular Accident Father   . Stomach cancer Sister   . Kidney disease Neg Hx   . Prostate cancer Neg Hx     Social History:  reports that he has never smoked. He has never used smokeless tobacco. He reports that he does not drink alcohol or use drugs.  ROS: UROLOGY Frequent Urination?:  No Hard to postpone urination?: No Burning/pain with urination?: No Get up at night to urinate?: No Leakage of urine?: No Urine stream starts and stops?: No Trouble starting stream?: No Do you have to strain to urinate?: No Blood in urine?: No Urinary tract infection?: No Sexually transmitted disease?: No Injury to kidneys or bladder?: No Painful intercourse?: No Weak stream?: No Erection problems?: No Penile pain?: No  Gastrointestinal Nausea?: No Vomiting?: No Indigestion/heartburn?: No Diarrhea?: No Constipation?: No  Constitutional Fever: No Night sweats?: No Weight loss?: No Fatigue?: No  Skin Skin rash/lesions?: No Itching?: No  Eyes Blurred vision?: No Double vision?: No  Ears/Nose/Throat Sore throat?: No Sinus problems?: No  Hematologic/Lymphatic Swollen glands?: No Easy bruising?: No  Cardiovascular Leg swelling?: No Chest pain?: No  Respiratory Cough?: No Shortness of breath?: No  Endocrine Excessive thirst?: No  Musculoskeletal Back pain?: Yes Joint pain?: No  Neurological Headaches?: No Dizziness?: No  Psychologic Depression?: No Anxiety?: No  Physical Exam: BP (!) 159/77 (BP Location: Left Arm, Patient Position: Sitting, Cuff Size: Normal)   Pulse 70   Ht 5\' 6"  (1.676 m)   Wt 128 lb 3.2 oz (58.2 kg)   BMI 20.69 kg/m   Constitutional:  Alert and oriented, No acute distress. Elderly, appears slightly younger than stated age.  HEENT: [place] AT, moist mucus membranes.  Trachea midline, no masses. Cardiovascular: No clubbing, cyanosis, or edema. Respiratory: Normal respiratory effort, no increased work of breathing. GI: Abdomen is soft, nontender, nondistended, no abdominal masses GU: No CVA tenderness.  Skin: No rashes, bruises or suspicious lesions. Neurologic: Grossly intact, no focal deficits, moving all 4 extremities. Psychiatric: Normal mood and affect.  Laboratory Data: Lab Results  Component Value Date   WBC 4.6  10/10/2015   HGB 12.0 (L) 10/10/2015   HCT 35.6 (L) 10/10/2015   MCV 90.7 10/10/2015   PLT 108 (L) 10/10/2015    Lab Results  Component Value Date   CREATININE 1.30 (H) 12/03/2015    Assessment & Plan:    1. Malignant neoplasm of lateral wall of urinary bladder (HCC)  - History of T2 TCC s/p chemo/ rads 2015  - Recurrence of Hg T1/ CIS on recent TURBT, pathology reviewed with patient  - No other evidence of metastatic diease on CT abd/pelvis  - consent is signed  - # 5 of 6 BCG given today, patient tolerated the procedure  - instructed to pour bleach in the toilet after voids for the next 6 hours  - he will RTC in one week for his 5th BCG  - Advised to contact our office or seek treatment in the ED if becomes febrile, develops a new cough, rigors, difficulty with urination or pain/ vomiting that are  difficult control in order to arrange for emergent/urgent intervention   Return in about 1 week (around 01/13/2016) for # 6 BCG.  Zara Council, [facility] [address] [place], [place] [address] [phone]

## 2016-01-06 NOTE — Addendum Note (Signed)
Addended by: Kyra Manges on: 01/06/2016 03:19 PM   Modules accepted: Orders

## 2016-01-06 NOTE — Progress Notes (Signed)
BCG Bladder Instillation  BCG # 5  Due to Bladder Cancer patient is present today for a BCG treatment. Patient was cleaned and prepped in a sterile fashion with betadine and lidocaine 2% jelly was instilled into the urethra.  A 14FR catheter was inserted, urine return was noted 48ml, urine was yellow in color.  27ml of reconstituted BCG was instilled into the bladder. The catheter was then removed. Patient tolerated well, no complications were noted  Preformed by: Zara Council, PA-C  Follow up/ Additional notes: 1 week BCG #6

## 2016-01-13 ENCOUNTER — Encounter: Payer: Self-pay | Admitting: Urology

## 2016-01-13 ENCOUNTER — Ambulatory Visit (INDEPENDENT_AMBULATORY_CARE_PROVIDER_SITE_OTHER): Payer: Medicare Other | Admitting: Urology

## 2016-01-13 VITALS — BP 143/89 | HR 83 | Ht 67.0 in | Wt 119.7 lb

## 2016-01-13 DIAGNOSIS — C679 Malignant neoplasm of bladder, unspecified: Secondary | ICD-10-CM

## 2016-01-13 LAB — URINALYSIS, COMPLETE
Bilirubin, UA: NEGATIVE
Leukocytes, UA: NEGATIVE
NITRITE UA: NEGATIVE
PH UA: 5 (ref 5.0–7.5)
Specific Gravity, UA: 1.01 (ref 1.005–1.030)
UUROB: 1 mg/dL (ref 0.2–1.0)

## 2016-01-13 LAB — MICROSCOPIC EXAMINATION
BACTERIA UA: NONE SEEN
Epithelial Cells (non renal): NONE SEEN /hpf (ref 0–10)
RBC, UA: NONE SEEN /hpf (ref 0–?)

## 2016-01-13 MED ORDER — LIDOCAINE HCL 2 % EX GEL
1.0000 "application " | Freq: Once | CUTANEOUS | Status: AC
  Administered 2016-01-13: 1 via URETHRAL

## 2016-01-13 MED ORDER — BCG LIVE 50 MG IS SUSR
3.2400 mL | Freq: Once | INTRAVESICAL | Status: AC
  Administered 2016-01-13: 81 mg

## 2016-01-13 NOTE — Progress Notes (Signed)
01/13/2016 2:42 PM   Colton Tyler 03-Nov-1925 [id number]  Referring provider: Kirk Ruths, MD [facility] [place], [place] [address]  Chief Complaint  Patient presents with  . Bladder Cancer    BCG 6    HPI: Patient is an 82 year old Caucasian male who presents today for his 6th in a series of 6 weekly induction treatments of BCG.    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  He returned to the office on 10/07/2015 at which time there are multiple lesions within the bladder concerning for carcinoma in situ with positive urine cytology.  After a recent return to the operating room for a TURBT of a recurrent lesion on 11/05/15 post operatively, he was noted to have an at least 2 cm area of patchy erythema involving the right lateral bladder wall beyond the right UO couldn't suspicious for CIS. His bladder was heavily trabeculated. There are no obvious papillary lesions.  Surgical pathology was consistent with urothelial carcinoma in situ as well as high-grade invasive disease into the lamina propria. No muscularis propria was present on the biopsies.  The suspicious area was fulgurated completely.  Most recent imaging in the form of CT urogram on 10/18/2015 shows no clear evidence of metastatic disease. He does have a small liver lesion which is somewhat atypical for metastatic disease.  Today, he states he has no urinary symptoms at this time.  He denies dysuria, gross hematuria and suprapubic pain.  He denies fevers, chills, nausea and vomiting.  He does not have a cough.  His UA today is unremarkable.  He states he was able to hold the BCG solution for two hours after his initial treatment.   PMH: Past Medical History:  Diagnosis Date  . Bladder cancer ([place])   . Chronic kidney disease   . Coronary artery  disease   . Dementia   . Diabetes ([place])   . Dysrhythmia   . GERD (gastroesophageal reflux disease)   . Hematuria, gross 07/23/2014  . Hyperlipidemia   . Hypertension   . Pancreatic cancer [facility])    s/p whipple procedure    Surgical History: Past Surgical History:  Procedure Laterality Date  . BLADDER SURGERY    . CYSTOSCOPY WITH BIOPSY N/A 11/05/2015   Procedure: CYSTOSCOPY WITH BIOPSY;  Surgeon: [name], MD;  Location: [facility] ORS;  Service: Urology;  Laterality: N/A;  . CYSTOSCOPY WITH FULGERATION  11/05/2015   Procedure: CYSTOSCOPY WITH FULGERATION;  Surgeon: [name], MD;  Location: [facility] ORS;  Service: Urology;;  . HERNIA REPAIR Left    Inguinal Hernia Repair  . PORTACATH PLACEMENT Right 2015   [facility]  . WHIPPLE PROCEDURE    . WHIPPLE PROCEDURE      Home Medications:    Medication List       Accurate as of 01/13/16  2:42 PM. Always use your most recent med list.          amLODipine 10 MG tablet Commonly known as:  NORVASC Take 10 mg by mouth daily.   aspirin 81 MG tablet Take 81 mg by mouth daily.   LANTUS SOLOSTAR 100 UNIT/ML Solostar Pen Generic drug:  Insulin Glargine Inject into the skin.   NAMENDA 10 MG tablet Generic drug:  memantine Take 10 mg by mouth daily.   simvastatin 20 MG tablet Commonly known as:  ZOCOR Take 20 mg by mouth daily at 12 noon.   sitaGLIPtin 25 MG tablet Commonly known as:  JANUVIA Take 25 mg by mouth daily at 12 noon.       Allergies:  Allergies  Allergen Reactions  . Penicillins Other (See Comments)    "unknown"    Family History: Family History  Problem Relation Age of Onset  . Cerebrovascular Accident Father   . Stomach cancer Sister   . Kidney disease Neg Hx   . Prostate cancer Neg Hx     Social History:  reports that he has never smoked. He has never used smokeless tobacco. He reports that he does not drink alcohol or use drugs.  ROS: UROLOGY Frequent Urination?: No Hard to postpone  urination?: No Burning/pain with urination?: No Get up at night to urinate?: No Leakage of urine?: No Urine stream starts and stops?: No Trouble starting stream?: No Do you have to strain to urinate?: No Blood in urine?: No Urinary tract infection?: No Sexually transmitted disease?: No Injury to kidneys or bladder?: No Painful intercourse?: No Weak stream?: No Erection problems?: No Penile pain?: No  Gastrointestinal Nausea?: No Vomiting?: No Indigestion/heartburn?: No Diarrhea?: No Constipation?: No  Constitutional Fever: No Night sweats?: No Weight loss?: No Fatigue?: No  Skin Skin rash/lesions?: No Itching?: No  Eyes Blurred vision?: No Double vision?: No  Ears/Nose/Throat Sore throat?: No Sinus problems?: No  Hematologic/Lymphatic Swollen glands?: No Easy bruising?: No  Cardiovascular Leg swelling?: No Chest pain?: No  Respiratory Cough?: No Shortness of breath?: No  Endocrine Excessive thirst?: No  Musculoskeletal Back pain?: Yes Joint pain?: No  Neurological Headaches?: No Dizziness?: No  Psychologic Depression?: No Anxiety?: No  Physical Exam: BP (!) 143/89   Pulse 83   Ht 5\' 7"  (1.702 m)   Wt 119 lb 11.2 oz (54.3 kg)   BMI 18.75 kg/m   Constitutional:  Alert and oriented, No acute distress. Elderly, appears slightly younger than stated age.  HEENT: [place] AT, moist mucus membranes.  Trachea midline, no masses. Cardiovascular: No clubbing, cyanosis, or edema. Respiratory: Normal respiratory effort, no increased work of breathing. GI: Abdomen is soft, nontender, nondistended, no abdominal masses GU: No CVA tenderness.  Skin: No rashes, bruises or suspicious lesions. Neurologic: Grossly intact, no focal deficits, moving all 4 extremities. Psychiatric: Normal mood and affect.  Laboratory Data: Lab Results  Component Value Date   WBC 4.6 10/10/2015   HGB 12.0 (L) 10/10/2015   HCT 35.6 (L) 10/10/2015   MCV 90.7 10/10/2015   PLT  108 (L) 10/10/2015    Lab Results  Component Value Date   CREATININE 1.30 (H) 12/03/2015    Assessment & Plan:    1. Malignant neoplasm of lateral wall of urinary bladder (HCC)  - History of T2 TCC s/p chemo/ rads 2015  - Recurrence of Hg T1/ CIS on recent TURBT, pathology reviewed with patient  - No other evidence of metastatic diease on CT abd/pelvis  - consent is signed  - # 6 of 6 BCG given today, patient tolerated the procedure  - instructed to pour bleach in the toilet after voids for the next 6 hours  - he will RTC in 3 months for surveillance cystoscopy  - Advised to contact our office or seek treatment in the ED if becomes febrile, develops a new cough, rigors, difficulty with urination or pain/ vomiting that are difficult control in order to arrange for emergent/urgent intervention   Return in about 3 months (around  04/14/2016) for cystoscopy.  Zara Council, [facility] [address] [place], [place] [address] [phone]

## 2016-01-13 NOTE — Progress Notes (Signed)
BCG Bladder Instillation  BCG # 6  Due to Bladder Cancer patient is present today for a BCG treatment. Patient was cleaned and prepped in a sterile fashion with betadine and lidocaine 2% jelly was instilled into the urethra.  A 14FR catheter was inserted, urine return was noted 74ml, urine was yellow  in color.  49ml of reconstituted BCG was instilled into the bladder. The catheter was then removed. Patient tolerated well, no complications were noted  Preformed by: Zara Council PA-C and Lyndee Hensen CMA  Follow up/ Additional notes: 3 moths for cystoscopy

## 2016-01-13 NOTE — Addendum Note (Signed)
Addended by: Orlene Erm on: 01/13/2016 03:30 PM   Modules accepted: Orders

## 2016-01-16 ENCOUNTER — Telehealth: Payer: Self-pay | Admitting: *Deleted

## 2016-01-16 ENCOUNTER — Inpatient Hospital Stay (HOSPITAL_BASED_OUTPATIENT_CLINIC_OR_DEPARTMENT_OTHER): Payer: Medicare Other | Admitting: Hematology and Oncology

## 2016-01-16 ENCOUNTER — Emergency Department
Admission: EM | Admit: 2016-01-16 | Discharge: 2016-01-16 | Disposition: A | Discharge status: Home / Self Care | Payer: Medicare Other | Attending: Emergency Medicine | Admitting: Emergency Medicine

## 2016-01-16 ENCOUNTER — Emergency Department: Payer: Medicare Other

## 2016-01-16 ENCOUNTER — Encounter: Payer: Self-pay | Admitting: *Deleted

## 2016-01-16 ENCOUNTER — Other Ambulatory Visit: Payer: Self-pay

## 2016-01-16 ENCOUNTER — Inpatient Hospital Stay: Payer: Medicare Other | Attending: Hematology and Oncology

## 2016-01-16 VITALS — BP 148/81 | HR 90 | Temp 97.7°F | Resp 18 | Wt 126.1 lb

## 2016-01-16 DIAGNOSIS — E1165 Type 2 diabetes mellitus with hyperglycemia: Secondary | ICD-10-CM | POA: Insufficient documentation

## 2016-01-16 DIAGNOSIS — K769 Liver disease, unspecified: Secondary | ICD-10-CM | POA: Insufficient documentation

## 2016-01-16 DIAGNOSIS — Z794 Long term (current) use of insulin: Secondary | ICD-10-CM | POA: Diagnosis not present

## 2016-01-16 DIAGNOSIS — I499 Cardiac arrhythmia, unspecified: Secondary | ICD-10-CM | POA: Diagnosis not present

## 2016-01-16 DIAGNOSIS — D696 Thrombocytopenia, unspecified: Secondary | ICD-10-CM | POA: Insufficient documentation

## 2016-01-16 DIAGNOSIS — M25559 Pain in unspecified hip: Secondary | ICD-10-CM

## 2016-01-16 DIAGNOSIS — N183 Chronic kidney disease, stage 3 (moderate): Secondary | ICD-10-CM | POA: Insufficient documentation

## 2016-01-16 DIAGNOSIS — R5383 Other fatigue: Secondary | ICD-10-CM | POA: Insufficient documentation

## 2016-01-16 DIAGNOSIS — Z8507 Personal history of malignant neoplasm of pancreas: Secondary | ICD-10-CM

## 2016-01-16 DIAGNOSIS — F0390 Unspecified dementia without behavioral disturbance: Secondary | ICD-10-CM | POA: Diagnosis not present

## 2016-01-16 DIAGNOSIS — M25552 Pain in left hip: Secondary | ICD-10-CM | POA: Insufficient documentation

## 2016-01-16 DIAGNOSIS — Z90411 Acquired partial absence of pancreas: Secondary | ICD-10-CM | POA: Diagnosis not present

## 2016-01-16 DIAGNOSIS — Z923 Personal history of irradiation: Secondary | ICD-10-CM | POA: Insufficient documentation

## 2016-01-16 DIAGNOSIS — K5790 Diverticulosis of intestine, part unspecified, without perforation or abscess without bleeding: Secondary | ICD-10-CM | POA: Insufficient documentation

## 2016-01-16 DIAGNOSIS — Z79899 Other long term (current) drug therapy: Secondary | ICD-10-CM | POA: Insufficient documentation

## 2016-01-16 DIAGNOSIS — I129 Hypertensive chronic kidney disease with stage 1 through stage 4 chronic kidney disease, or unspecified chronic kidney disease: Secondary | ICD-10-CM | POA: Insufficient documentation

## 2016-01-16 DIAGNOSIS — E1122 Type 2 diabetes mellitus with diabetic chronic kidney disease: Secondary | ICD-10-CM | POA: Diagnosis not present

## 2016-01-16 DIAGNOSIS — I2510 Atherosclerotic heart disease of native coronary artery without angina pectoris: Secondary | ICD-10-CM

## 2016-01-16 DIAGNOSIS — Z8551 Personal history of malignant neoplasm of bladder: Secondary | ICD-10-CM | POA: Diagnosis not present

## 2016-01-16 DIAGNOSIS — Z8042 Family history of malignant neoplasm of prostate: Secondary | ICD-10-CM

## 2016-01-16 DIAGNOSIS — R739 Hyperglycemia, unspecified: Secondary | ICD-10-CM

## 2016-01-16 DIAGNOSIS — Z800 Family history of malignant neoplasm of digestive organs: Secondary | ICD-10-CM

## 2016-01-16 DIAGNOSIS — Z7982 Long term (current) use of aspirin: Secondary | ICD-10-CM | POA: Insufficient documentation

## 2016-01-16 DIAGNOSIS — Z9221 Personal history of antineoplastic chemotherapy: Secondary | ICD-10-CM | POA: Diagnosis not present

## 2016-01-16 DIAGNOSIS — C679 Malignant neoplasm of bladder, unspecified: Secondary | ICD-10-CM | POA: Diagnosis not present

## 2016-01-16 DIAGNOSIS — Z452 Encounter for adjustment and management of vascular access device: Secondary | ICD-10-CM | POA: Insufficient documentation

## 2016-01-16 DIAGNOSIS — K219 Gastro-esophageal reflux disease without esophagitis: Secondary | ICD-10-CM

## 2016-01-16 DIAGNOSIS — N189 Chronic kidney disease, unspecified: Secondary | ICD-10-CM

## 2016-01-16 DIAGNOSIS — E785 Hyperlipidemia, unspecified: Secondary | ICD-10-CM | POA: Insufficient documentation

## 2016-01-16 DIAGNOSIS — D649 Anemia, unspecified: Secondary | ICD-10-CM

## 2016-01-16 DIAGNOSIS — R8299 Other abnormal findings in urine: Secondary | ICD-10-CM | POA: Diagnosis not present

## 2016-01-16 LAB — COMPREHENSIVE METABOLIC PANEL
ALBUMIN: 3.6 g/dL (ref 3.5–5.0)
ALT: 29 U/L (ref 17–63)
ALT: 29 U/L (ref 17–63)
AST: 35 U/L (ref 15–41)
AST: 31 U/L (ref 15–41)
Albumin: 3.7 g/dL (ref 3.5–5.0)
Alkaline Phosphatase: 190 U/L — ABNORMAL HIGH (ref 38–126)
Alkaline Phosphatase: 191 U/L — ABNORMAL HIGH (ref 38–126)
Anion gap: 12 (ref 5–15)
Anion gap: 12 (ref 5–15)
BUN: 26 mg/dL — ABNORMAL HIGH (ref 6–20)
BUN: 26 mg/dL — ABNORMAL HIGH (ref 6–20)
CHLORIDE: 92 mmol/L — AB (ref 101–111)
CO2: 24 mmol/L (ref 22–32)
CO2: 24 mmol/L (ref 22–32)
CREATININE: 1.46 mg/dL — AB (ref 0.61–1.24)
Calcium: 9.1 mg/dL (ref 8.9–10.3)
Calcium: 9.3 mg/dL (ref 8.9–10.3)
Chloride: 91 mmol/L — ABNORMAL LOW (ref 101–111)
Creatinine, Ser: 1.56 mg/dL — ABNORMAL HIGH (ref 0.61–1.24)
GFR calc Af Amer: 44 mL/min — ABNORMAL LOW (ref >60)
GFR calc non Af Amer: 38 mL/min — ABNORMAL LOW (ref >60)
GFR calc non Af Amer: 41 mL/min — ABNORMAL LOW (ref >60)
GFR, EST AFRICAN AMERICAN: 48 mL/min — AB (ref >60)
GLUCOSE: 527 mg/dL — AB (ref 65–99)
Glucose, Bld: 539 mg/dL (ref 65–99)
Potassium: 4.1 mmol/L (ref 3.5–5.1)
Potassium: 4.9 mmol/L (ref 3.5–5.1)
SODIUM: 128 mmol/L — AB (ref 135–145)
Sodium: 127 mmol/L — ABNORMAL LOW (ref 135–145)
Total Bilirubin: 0.8 mg/dL (ref 0.3–1.2)
Total Bilirubin: 1 mg/dL (ref 0.3–1.2)
Total Protein: 6.7 g/dL (ref 6.5–8.1)
Total Protein: 6.7 g/dL (ref 6.5–8.1)

## 2016-01-16 LAB — URINALYSIS COMPLETE WITH MICROSCOPIC (ARMC ONLY)
BACTERIA UA: NONE SEEN
BILIRUBIN URINE: NEGATIVE
LEUKOCYTES UA: NEGATIVE
NITRITE: NEGATIVE
Protein, ur: NEGATIVE mg/dL
SPECIFIC GRAVITY, URINE: 1.016 (ref 1.005–1.030)
Squamous Epithelial / LPF: NONE SEEN
pH: 6 (ref 5.0–8.0)

## 2016-01-16 LAB — CBC WITH DIFFERENTIAL/PLATELET
Basophils Absolute: 0 10*3/uL (ref 0–0.1)
Basophils Relative: 0 %
Eosinophils Absolute: 0 10*3/uL (ref 0–0.7)
Eosinophils Relative: 0 %
HCT: 38.9 % — ABNORMAL LOW (ref 40.0–52.0)
Hemoglobin: 12.6 g/dL — ABNORMAL LOW (ref 13.0–18.0)
Lymphocytes Relative: 5 %
Lymphs Abs: 0.6 10*3/uL — ABNORMAL LOW (ref 1.0–3.6)
MCH: 29.4 pg (ref 26.0–34.0)
MCHC: 32.4 g/dL (ref 32.0–36.0)
MCV: 90.9 fL (ref 80.0–100.0)
Monocytes Absolute: 0.8 10*3/uL (ref 0.2–1.0)
Monocytes Relative: 7 %
Neutro Abs: 10 10*3/uL — ABNORMAL HIGH (ref 1.4–6.5)
Neutrophils Relative %: 88 %
Platelets: 260 10*3/uL (ref 150–440)
RBC: 4.28 MIL/uL — ABNORMAL LOW (ref 4.40–5.90)
RDW: 14.9 % — ABNORMAL HIGH (ref 11.5–14.5)
WBC: 11.4 10*3/uL — ABNORMAL HIGH (ref 3.8–10.6)

## 2016-01-16 LAB — TROPONIN I
Troponin I: 0.04 ng/mL (ref <0.03)
Troponin I: 0.04 ng/mL (ref <0.03)

## 2016-01-16 LAB — GLUCOSE, CAPILLARY
GLUCOSE-CAPILLARY: 332 mg/dL — AB (ref 65–99)
Glucose-Capillary: 437 mg/dL — ABNORMAL HIGH (ref 65–99)

## 2016-01-16 LAB — FERRITIN: Ferritin: 493 ng/mL — ABNORMAL HIGH (ref 24–336)

## 2016-01-16 LAB — TSH: TSH: 2.926 u[IU]/mL (ref 0.350–4.500)

## 2016-01-16 MED ORDER — MORPHINE SULFATE (PF) 4 MG/ML IV SOLN
4.0000 mg | Freq: Once | INTRAVENOUS | Status: DC
  Filled 2016-01-16: qty 1

## 2016-01-16 MED ORDER — INSULIN ASPART 100 UNIT/ML ~~LOC~~ SOLN
8.0000 [IU] | Freq: Once | SUBCUTANEOUS | Status: AC
  Administered 2016-01-16: 8 [IU] via INTRAVENOUS
  Filled 2016-01-16: qty 8

## 2016-01-16 MED ORDER — SODIUM CHLORIDE 0.9 % IV BOLUS (SEPSIS): 500.0000 mL | Freq: Once | INTRAVENOUS | Status: DC

## 2016-01-16 MED ORDER — KETOROLAC TROMETHAMINE 30 MG/ML IJ SOLN
15.0000 mg | Freq: Once | INTRAMUSCULAR | Status: DC
  Filled 2016-01-16: qty 1

## 2016-01-16 MED ORDER — SODIUM CHLORIDE 0.9 % IV BOLUS (SEPSIS)
1000.0000 mL | Freq: Once | INTRAVENOUS | Status: AC
  Administered 2016-01-16: 1000 mL via INTRAVENOUS

## 2016-01-16 NOTE — ED Provider Notes (Signed)
-----------------------------------------   9:50 PM on 01/16/2016 -----------------------------------------   Blood pressure 122/83, pulse (!) 41, temperature 97.9 F (36.6 C), temperature source Oral, resp. rate 20, height 5\' 7"  (1.702 m), weight 126 lb (57.2 kg), SpO2 99 %.  Assuming care from Dr. Burlene Arnt.  In short, Colton Tyler is a 82 y.o. male with a chief complaint of Hyperglycemia .  Refer to the original H&P for additional details.  The current plan of care is to  Follow patient's urinalysis and troponin. Troponin remained at 0.04 and was felt to be nonsignificant from a medical standpoint. Urinalysis appears normal with some glucose but no other abnormalities noted. No evidence for ketosis. Patient will be discharged home per Dr. Andre Lefort instructions.    Daymon Larsen, MD 01/16/16 2151

## 2016-01-16 NOTE — ED Notes (Signed)

## 2016-01-16 NOTE — Telephone Encounter (Signed)
Josh called about 22 minutes ago and I gave the result of sugar is 539 to [place] immediately  Notfied. After she saw pt she called and spoke to Dr. Ouida Sills and was told to send pt to ER. [place] called resport to MD in ER. Orderly called and he came and got pt and took to ER.

## 2016-01-16 NOTE — ED Provider Notes (Addendum)
[facility] Emergency Department Provider Note  ____________________________________________   I have reviewed the triage vital signs and the nursing notes.   HISTORY  Chief Complaint Hyperglycemia    HPI Colton Tyler is a 82 y.o. male  presents today complaining of multiple different things. Patient has a history of very poorly controlled glucose, he takes is on insulin at home. His sugar on multiple different visits here has beenroutinely in the 300s. This is as long as a year ago. Patient has been treated for bladder cancer is not getting chemoradiation however was noted that his sugar was up in the family states that he has been falling. In fact, his last fall was several weeks ago. He has had some residual hip discomfort but he is able to ambulate. Family would like a hip x-ray on the left as well as evaluation for his elevated sugar. He does live with his wife. Normally he is pretty good about taking his insulin but they're not sure if he has been remembering to do it very well. Otherwise is been no acute decompensation patient himself has no complaints and feels "pretty good. He examined that he does not wish to be admitted to the hospital.    Past Medical History:  Diagnosis Date  . Bladder cancer ([place])   . Chronic kidney disease   . Coronary artery disease   . Dementia   . Diabetes ([place])   . Dysrhythmia   . GERD (gastroesophageal reflux disease)   . Hematuria, gross 07/23/2014  . Hyperlipidemia   . Hypertension   . Pancreatic cancer [facility])    s/p whipple procedure    Patient Active Problem List   Diagnosis Date Noted  . Anemia 11/02/2014  . Hematuria, gross 07/23/2014  . Bladder mass 06/09/2014  . Bladder cancer ([place]) 06/09/2014  . Hematochezia 06/09/2014  . Diabetes ([place]) 06/09/2014  . Frank hematuria 06/09/2014  . Blood in the urine 06/09/2014  . BP (high blood pressure) 06/09/2014  . Malignant neoplasm of pancreas ([place])  06/09/2014  . Surgery follow-up examination 06/09/2014  . Type 2 diabetes mellitus with stage 3 chronic kidney disease, with long-term current use of insulin ([place]) 06/09/2014  . Bladder disease 06/09/2014  . Essential hypertension 06/09/2014  . Urinary catheter insertion/adjustment/removal 08/22/2013  . Fitting and adjustment of urinary device 08/22/2013  . Abnormal ECG 08/03/2013  . Arteriosclerosis of coronary artery 08/03/2013  . Carotid artery narrowing 08/03/2013  . Hyperlipemia 08/03/2013  . Amnesia 08/03/2013  . OP (osteoporosis) 08/03/2013  . Beat, premature ventricular 08/03/2013  . CAD (coronary artery disease) 08/03/2013  . Carotid artery disease ([place]) 08/03/2013  . Thrombocytopenia ([place]) 07/07/2013    Past Surgical History:  Procedure Laterality Date  . BLADDER SURGERY    . CYSTOSCOPY WITH BIOPSY N/A 11/05/2015   Procedure: CYSTOSCOPY WITH BIOPSY;  Surgeon: [name], MD;  Location: [facility] ORS;  Service: Urology;  Laterality: N/A;  . CYSTOSCOPY WITH FULGERATION  11/05/2015   Procedure: CYSTOSCOPY WITH FULGERATION;  Surgeon: [name], MD;  Location: [facility] ORS;  Service: Urology;;  . HERNIA REPAIR Left    Inguinal Hernia Repair  . PORTACATH PLACEMENT Right 2015   [facility]  . WHIPPLE PROCEDURE    . WHIPPLE PROCEDURE      Prior to Admission medications   Medication Sig Start Date End Date Taking? Authorizing Provider  amLODipine (NORVASC) 10 MG tablet Take 10 mg by mouth daily.  02/12/15   Historical Provider, MD  aspirin 81 MG  tablet Take 81 mg by mouth daily.     Historical Provider, MD  Insulin Glargine (LANTUS SOLOSTAR) 100 UNIT/ML Solostar Pen Inject into the skin. 12/24/14   Historical Provider, MD  memantine (NAMENDA) 10 MG tablet Take 10 mg by mouth daily.  02/12/15   Historical Provider, MD  simvastatin (ZOCOR) 20 MG tablet Take 20 mg by mouth daily at 12 noon.  02/12/15   Historical Provider, MD  sitaGLIPtin (JANUVIA) 25 MG tablet Take 25 mg by mouth daily at  12 noon.     Historical Provider, MD    Allergies Penicillins  Family History  Problem Relation Age of Onset  . Cerebrovascular Accident Father   . Stomach cancer Sister   . Kidney disease Neg Hx   . Prostate cancer Neg Hx     Social History Social History  Substance Use Topics  . Smoking status: Never Smoker  . Smokeless tobacco: Never Used  . Alcohol use No    Review of Systems Constitutional: No fever/chills Eyes: No visual changes. ENT: No sore throat. No stiff neck no neck pain Cardiovascular: Denies chest pain. Respiratory: Denies shortness of breath. Gastrointestinal:   no vomiting.  No diarrhea.  No constipation. Genitourinary: Negative for dysuria. Musculoskeletal: Negative lower extremity swelling Skin: Negative for rash. Neurological: Negative for severe headaches, focal weakness or numbness. 10-point ROS otherwise negative.  ____________________________________________   PHYSICAL EXAM:  VITAL SIGNS: ED Triage Vitals  Enc Vitals Group     BP 01/16/16 1543 (!) 143/80     Pulse Rate 01/16/16 1543 94     Resp 01/16/16 1543 18     Temp 01/16/16 1543 97.9 F (36.6 C)     Temp Source 01/16/16 1543 Oral     SpO2 01/16/16 1543 99 %     Weight 01/16/16 1544 126 lb (57.2 kg)     Height 01/16/16 1544 5\' 7"  (1.702 m)     Head Circumference --      Peak Flow --      Pain Score 01/16/16 1545 7     Pain Loc --      Pain Edu? --      Excl. in [place]? --     Constitutional: Alert and oriented. Well appearing and in no acute distress. Eyes: Conjunctivae are normal. PERRL. EOMI. Head: Atraumatic. Nose: No congestion/rhinnorhea. Mouth/Throat: Mucous membranes are moist.  Oropharynx non-erythematous. Neck: No stridor.   Nontender with no meningismus Cardiovascular: Normal rate, regular rhythm. Grossly normal heart sounds.  Good peripheral circulation. Respiratory: Normal respiratory effort.  No retractions. Lungs CTAB. Abdominal: Soft and nontender. No  distention. No guarding no rebound Back:  There is no focal tenderness or step off.  there is no midline tenderness there are no lesions noted. there is no CVA tenderness  Musculoskeletal: She has minimal tenderness to palpation left hip but full range of motion no obvious deformity strong distal pulses no upper extremity tenderness. No joint effusions, no DVT signs strong distal pulses no edema Neurologic:  Normal speech and language. No gross focal neurologic deficits are appreciated.  Skin:  Skin is warm, dry and intact. No rash noted. Psychiatric: Mood and affect are normal. Speech and behavior are normal.  ____________________________________________   LABS (all labs ordered are listed, but only abnormal results are displayed)  Labs Reviewed  COMPREHENSIVE METABOLIC PANEL - Abnormal; Notable for the following:       Result Value   Sodium 128 (*)    Chloride 92 (*)  Glucose, Bld 527 (*)    BUN 26 (*)    Creatinine, Ser 1.46 (*)    Alkaline Phosphatase 191 (*)    GFR calc non Af Amer 41 (*)    GFR calc Af Amer 48 (*)    All other components within normal limits  TROPONIN I - Abnormal; Notable for the following:    Troponin I 0.04 (*)    All other components within normal limits  GLUCOSE, CAPILLARY - Abnormal; Notable for the following:    Glucose-Capillary 437 (*)    All other components within normal limits  URINE CULTURE  TSH  URINALYSIS COMPLETEWITH MICROSCOPIC ([facility] ONLY)  CBG MONITORING, ED   ____________________________________________  EKG  I personally interpreted any EKGs ordered by me or triage Sinus rhythm rate 88 bpm, LAD noted, no acute ischemia. PVC noted. ____________________________________________  [id number]  I reviewed any imaging ordered by me or triage that were performed during my shift and, if possible, patient and/or family made aware of any abnormal findings. ____________________________________________   PROCEDURES  Procedure(s)  performed: None  Procedures  Critical Care performed: None  ____________________________________________   INITIAL IMPRESSION / ASSESSMENT AND PLAN / ED COURSE  Pertinent labs & imaging results that were available during my care of the patient were reviewed by me and considered in my medical decision making (see chart for details).  [place] presents today with elevated blood sugar which is somewhat elevated than his baseline. We have given him IV fluid, it is trending down without insulin but we'll also give him insulin. He will stay with family tonight. He has no complaints. Did do a diffuse workup to see if I could find a cause for his elevated sugar aside from possible noncompliance. There is no evidence of pneumonia at this time, chest x-ray is reassuring. There is no evidence of significant dehydration, creatinine and BUN are approximately baseline. There is no evidence of coronary ischemia. Borderline troponin noted but this is not unusual for this lab and he has no chest pain or shortness of breath. No evidence of hyper or hypothyroid. Family would like to go home as with patient. We are waiting for UA results. I will send second troponin as a precaution.  ----------------------------------------- 8:37 PM on 01/16/2016 -----------------------------------------  signed out Dr. Marcelene Butte at the end of my shift. I have discussed with the patient and family the need for home nursing, we cannot set that up out of the emergency room at this hour, but they will follow-up very closely with PCP and see if they can establish some more care for them at home to make sure that he is doing better job managing his medications. No evidence of DKA. Blood glucose is coming down very close to the patient's normal level.  Clinical Course    ____________________________________________   FINAL CLINICAL IMPRESSION(S) / ED DIAGNOSES  Final diagnoses:  Abdominal pain      This chart was dictated using  voice recognition software.  Despite best efforts to proofread,  errors can occur which can change meaning.      Schuyler Amor, MD 01/16/16 2019    Schuyler Amor, MD 01/16/16 2038

## 2016-01-16 NOTE — Progress Notes (Signed)
Colton Tyler is an 82 y.o. male with bladder cancer who is seen for 10 month assessment.  HPI: The patient was last seen in the medical oncology clinic on 03/05/2015. At that time, he felt good.  He denied any hematuria.  Colton memory was poor.  Exam was unremarkable.  Hematocrit was 38.2.  Platelet count was 114,000.  SPEP was negative.  Free light chain ratio was normal.  B12 was 528 and folate 38.0.  Ferritin was 138 with normal iron studies.  CBC on 05/31/2015 revealed a hematocrit of 37.7, hemoglobin 12.7, MCV 90.9, platelets 103,000, WBC 5200 with an ANC of 3700.  Cystoscopy on 10/07/2015 revealed multiple lesions within the bladder concerning for carcinoma in situ with positive urine cytology.  CT urogram on 10/18/2015 revealed a new ill-defined mass in segment 3 of the liver.  MRI was recommended.  The pancreas appears stable status post Whipple procedure.  There was no other evidence of metastatic disease.  There was wall thickening of the transverse colon, also possibly related to prior radiation therapy.  There was generalized bladder wall thickening and trabeculation without apparent focal lesion.  He underwent cystoscopy on 11/05/2015.  He was noted to have an at least a 2 cm area of patchy erythema involving the right lateral bladder wall beyond the right UO.  Colton bladder was heavily trabeculated. There are no obvious papillary lesions.  The suspicious area was fulgurated completely.  Pathology revealed invasive urothelial carcinoma invading the superficial lamina propria (stage T1).  No mucularis propria was present.  There was also urothelial carcinoma in situ.  Post procedure, he had gross hematuria.    Liver MRI on 12/03/2015 confirmed that the attenuation abnormality identified within the lateral segment of left lobe of liver on study from 10/18/2015 corresponded to a area of focal fatty  deposition.  An arterial phase enhancing structure within the posterior right lobe of liver was likely present on study from 02/23/2014. This was favored to represent benign abnormalities such is a perfusion anomaly or flash fill hemangioma. In light of the patient's increased risk for liver metastases a followup examination in 6 months with repeat contrast enhanced MRI of the upper abdomen was recommended.  There was stable postoperative appearance of the pancreas and upper GI tract compatible with previous Whipple procedure.  He was scheduled to receive induction and maintenance BCG treatment.  Colton daughter states that he completed BCG treatment.  He has a cystoscopy scheduled for 04/2016.   Symptomatically, he notes no hematuria or bladder symptoms.  He fell and hurt Colton hip in the middle of the night 2 weeks ago.  Colton daughter is concerned about a possible fracture as he still complains of pain.     Past Medical History:  Diagnosis Date  . Bladder cancer ([place])   . Chronic kidney disease   . Coronary artery disease   . Dementia   . Diabetes ([place])   . Dysrhythmia   . GERD (gastroesophageal reflux disease)   . Hematuria, gross 07/23/2014  . Hyperlipidemia   . Hypertension   . Pancreatic cancer [facility])    s/p whipple procedure   Past Surgical History:  Procedure Laterality Date  . BLADDER SURGERY    . CYSTOSCOPY WITH BIOPSY N/A 11/05/2015   Procedure: CYSTOSCOPY WITH BIOPSY;  Surgeon: [name], MD;  Location: [facility] ORS;  Service: Urology;  Laterality: N/A;  . CYSTOSCOPY WITH  FULGERATION  11/05/2015   Procedure: CYSTOSCOPY WITH FULGERATION;  Surgeon: [name], MD;  Location: [facility] ORS;  Service: Urology;;  . HERNIA REPAIR Left    Inguinal Hernia Repair  . PORTACATH PLACEMENT Right 2015   [facility]  . WHIPPLE PROCEDURE    . WHIPPLE PROCEDURE     Family History  Problem Relation Age of Onset  . Cerebrovascular Accident Father   . Stomach cancer Sister   . Kidney disease Neg Hx    . Prostate cancer Neg Hx    Social History:  reports that he has never smoked. He has never used smokeless tobacco. He reports that he does not drink alcohol or use drugs.  The patient is accompanied by Colton daughter, Colton Tyler, and later by Colton Tyler.  Allergies:  Allergies  Allergen Reactions  . Penicillins Other (See Comments)    "unknown"   Current Medications: Current Outpatient Prescriptions  Medication Sig Dispense Refill  . amLODipine (NORVASC) 10 MG tablet Take 10 mg by mouth daily.     Marland Kitchen aspirin 81 MG tablet Take 81 mg by mouth daily.     . Insulin Glargine (LANTUS SOLOSTAR) 100 UNIT/ML Solostar Pen Inject into the skin.    . memantine (NAMENDA) 10 MG tablet Take 10 mg by mouth daily.     . simvastatin (ZOCOR) 20 MG tablet Take 20 mg by mouth daily at 12 noon.     . sitaGLIPtin (JANUVIA) 25 MG tablet Take 25 mg by mouth daily at 12 noon.      No current facility-administered medications for this visit.    Facility-Administered Medications Ordered in Other Visits  Medication Dose Route Frequency Provider Last Rate Last Dose  . sodium chloride 0.9 % injection 10 mL  10 mL Intravenous PRN Forest Gleason, MD   10 mL at 07/27/14 1136   Review of Systems:  GENERAL:  Sleepy.  No fevers or sweats.  Weight down 15 pounds in past 10 months. PERFORMANCE STATUS (ECOG):  2 HEENT:  No visual changes, runny nose, sore throat, mouth sores or tenderness. Lungs: No shortness of breath or cough.  No hemoptysis. Cardiac:  No chest pain, palpitations, orthopnea, or PND. GI:  No nausea, vomiting, diarrhea, constipation, melena or hematochezia. GU:  No urgency, frequency, dysuria, or hematuria.  BCG treatment complete. Musculoskeletal:  Hip pain s/p fall 2 weeks ago.  No joint pain.  No muscle tenderness. Extremities:  No pain or swelling. Skin:  No rashes or skin changes.  No bruising or bleeding. Neuro:  No headache, numbness or weakness, balance or coordination issues. Endocrine:  Diabetes.  No  thyroid issues, hot flashes or night sweats. Psych:  No mood changes, depression or anxiety. Pain:  Hip pain. Review of systems:  All other systems reviewed and found to be negative.   Physical Exam: Blood pressure (!) 148/81, pulse 90, temperature 97.7 F (36.5 C), temperature source Tympanic, resp. rate 18, weight 126 lb 1 oz (57.2 kg). GENERAL:  Thin frail elderly gentleman sitting comfortably in the exam room in no acute distress.  He needs assistance onto the exam table. MENTAL STATUS:  Alert and oriented to person, place and time. HEAD:  White hair.  Male pattern baldness with sun induced changes to scalp.  Normocephalic, atraumatic, face symmetric, no Cushingoid features. EYES:  Glasses.  Blue eyes.  Pupils equal round and reactive to light and accomodation.  No conjunctivitis or scleral icterus. ENT:  Oropharynx clear without lesion.  Tongue normal. Mucous membranes dry.  RESPIRATORY:  Clear to auscultation without rales, wheezes or rhonchi. CARDIOVASCULAR:  Regular rate and rhythm without murmur, rub or gallop. ABDOMEN:  Soft, non-tender, with active bowel sounds, and no hepatosplenomegaly.  No masses. SKIN:  No rashes, ulcers or lesions. EXTREMITIES: No edema, no skin discoloration or tenderness.  No palpable cords. LYMPH NODES: No palpable cervical, supraclavicular, axillary or inguinal adenopathy  NEUROLOGICAL: Unremarkable. PSYCH:  Appropriate.    Appointment on 01/16/2016  Component Date Value Ref Range Status  . WBC 01/16/2016 11.4* 3.8 - 10.6 K/uL Final  . RBC 01/16/2016 4.28* 4.40 - 5.90 MIL/uL Final  . Hemoglobin 01/16/2016 12.6* 13.0 - 18.0 g/dL Final  . HCT 01/16/2016 38.9* 40.0 - 52.0 % Final  . MCV 01/16/2016 90.9  80.0 - 100.0 fL Final  . MCH 01/16/2016 29.4  26.0 - 34.0 pg Final  . MCHC 01/16/2016 32.4  32.0 - 36.0 g/dL Final  . RDW 01/16/2016 14.9* 11.5 - 14.5 % Final  . Platelets 01/16/2016 260  150 - 440 K/uL Final  . Neutrophils Relative % 01/16/2016 88   % Final  . Neutro Abs 01/16/2016 10.0* 1.4 - 6.5 K/uL Final  . Lymphocytes Relative 01/16/2016 5  % Final  . Lymphs Abs 01/16/2016 0.6* 1.0 - 3.6 K/uL Final  . Monocytes Relative 01/16/2016 7  % Final  . Monocytes Absolute 01/16/2016 0.8  0.2 - 1.0 K/uL Final  . Eosinophils Relative 01/16/2016 0  % Final  . Eosinophils Absolute 01/16/2016 0.0  0 - 0.7 K/uL Final  . Basophils Relative 01/16/2016 0  % Final  . Basophils Absolute 01/16/2016 0.0  0 - 0.1 K/uL Final  . Sodium 01/16/2016 127* 135 - 145 mmol/L Final  . Potassium 01/16/2016 4.1  3.5 - 5.1 mmol/L Final  . Chloride 01/16/2016 91* 101 - 111 mmol/L Final  . CO2 01/16/2016 24  22 - 32 mmol/L Final  . Glucose, Bld 01/16/2016 539* 65 - 99 mg/dL Final  . BUN 01/16/2016 26* 6 - 20 mg/dL Final  . Creatinine, Ser 01/16/2016 1.56* 0.61 - 1.24 mg/dL Final  . Calcium 01/16/2016 9.1  8.9 - 10.3 mg/dL Final  . Total Protein 01/16/2016 6.7  6.5 - 8.1 g/dL Final  . Albumin 01/16/2016 3.7  3.5 - 5.0 g/dL Final  . AST 01/16/2016 35  15 - 41 U/L Final  . ALT 01/16/2016 29  17 - 63 U/L Final  . Alkaline Phosphatase 01/16/2016 190* 38 - 126 U/L Final  . Total Bilirubin 01/16/2016 0.8  0.3 - 1.2 mg/dL Final  . GFR calc non Af Amer 01/16/2016 38* >60 mL/min Final  . GFR calc Af Amer 01/16/2016 44* >60 mL/min Final   Comment: (NOTE) The eGFR has been calculated using the CKD EPI equation. This calculation has not been validated in all clinical situations. eGFR's persistently <60 mL/min signify possible Chronic Kidney Disease.   . Anion gap 01/16/2016 12  5 - 15 Final   Assessment:  Colton Tyler is an 82 y.o. male with a history of clinical stage II (T2NxM0) bladder cancer.  He underwent a TURBT on 08/14/2013.  Pathology revealed a 3 cm high grade base of bladder tumor which invaded the muscularis propria.  There was lymphovascular invasion.   He received 7 weekly cycles of carboplatin (10/03/2013 - 11/21/2013) with radiation (10/17/2013 -  12/08/2013).  Chest, abdomen, and pelvic CT scan on 02/23/2014 revealed no evidence of metastic disease.  He underwent cystoscopy in 04/2014 per Colton Tyler (unknown results).  He  has a history of pancreatic cancer in the 1960s/1970s.  He underwent Whipple.  He has had no evidence of recurrent disease.  Chest, abdomen, and pelvic CT scan on 11/09/2014 revealed no evidence of recurrent disease. He has diverticulosis.  Cystoscopy on 10/07/2015 revealed multiple lesions within the bladder concerning for carcinoma in situ with positive urine cytology.  CT urogram on 10/18/2015 revealed generalized bladder wall thickening and trabeculation without apparent focal lesion.  Cystoscopy on 11/05/2015 revealed at least a 2 cm area of patchy erythema involving the right lateral bladder wall beyond the right UO.  The suspicious area was fulgurated completely.  Pathology revealed invasive urothelial carcinoma invading the superficial lamina propria (stage T1).  No mucularis propria was present.  There was also urothelial carcinoma in situ.  He completed induction and maintenance BCG treatment.  Cystoscopy is scheduled for 04/2016.   Liver MRI on 12/03/2015 revealed an area of focal fatty deposition.  There was stable postoperative appearance of the pancreas and upper GI tract compatible with previous Whipple procedure.  Follow-up imaging was recommended in 6 months.  He was on oral iron TID for some time likely secondary to initial hematuria.  Iron was discontinued on 04/27/2014.  Diet is good.  Colton last colonoscopy was 4 years ago.  He denies any melena or hematochezia.  Iron stores are good.  He has chronic kidney disease (creatinine clearance 35 ml/min).  He has dementia.   He has a history of mild thrombocytopenia since 07/2013 of unclear etiology.  Platelet count has ranged from 65,000 - 134,000 without trend.  He is on no new medications or herbal products.  He denies any bruising or bleeding.  Symptomatically,  he is fatigued.  He fell 2 weeks ago and has persistent hip pain.  He denies any hematuria.  Hematocrit is 38.9.  Platelet count is normal.  Blood sugar is 539.   Plan: 1.  Labs today:  CBC with diff, CMP, ferritin. 2.  Discuss interim studies and results.  Discuss treatment with BCG.  Discuss follow-up cystoscopy. 3.  Discuss liver lesion.  MRI reveals benign focal fatty infiltration. 4.  Discuss hyperglycemia.  Dr Frazier Richards contacted.  Given patient's debilitation, will refer to the ER-called. 5.  Discuss plan for plain films of Colton hips s/p fall in the ER. 6.  Port flush every 6-8 weeks. 7.  Transport patient to the ER. 8.  RTC in 6 months for MD assess and labs (CBC with diff, CMP).   Lequita Asal, MD  01/16/2016, 2:48 PM

## 2016-01-16 NOTE — Progress Notes (Signed)
Patient here today for follow up regarding bladder cancer.  Finished treatment with [name] on Monday.  Patient fell at home two weeks ago.  Refused to go to Dr.  However, since that time has complained in bilateral hip area.  Patient is accompanied by daughter today.  Patient presents in wheelchair today due to diminished movement caused by the fall.

## 2016-01-16 NOTE — ED Triage Notes (Signed)
Pt sent to er from cancer center.  Pt has elevated blood sugar today.  Hx bladder cancer.  Pt alert.

## 2016-01-16 NOTE — Discharge Instructions (Signed)
Please stay with him tonight because we did give him insulin. Follow closely with primary care doctor in the next few days. They likely should arrange home nursing to help make sure he is taking his insulin well. Check his sugar regularly. He does appear to be acting sweaty or shaky or confused, or otherwise unwell please check his sugar and give him something sweet like juice. Follow closely with primary care in the next day or 2. As for his hip pain, there is no evidence of a fracture today and he has been walking on it. We advised that he'll follow up closely with orthopedic surgery. If he has persistent symptoms he may require an MRI.

## 2016-01-17 ENCOUNTER — Other Ambulatory Visit: Payer: Self-pay | Admitting: *Deleted

## 2016-01-17 DIAGNOSIS — C679 Malignant neoplasm of bladder, unspecified: Secondary | ICD-10-CM

## 2016-01-18 ENCOUNTER — Encounter: Payer: Self-pay | Admitting: Hematology and Oncology

## 2016-01-18 LAB — URINE CULTURE

## 2016-01-21 ENCOUNTER — Inpatient Hospital Stay: Payer: Medicare Other

## 2016-01-28 ENCOUNTER — Inpatient Hospital Stay: Payer: Medicare Other

## 2016-01-28 DIAGNOSIS — Z95828 Presence of other vascular implants and grafts: Secondary | ICD-10-CM

## 2016-01-28 DIAGNOSIS — C679 Malignant neoplasm of bladder, unspecified: Secondary | ICD-10-CM | POA: Diagnosis not present

## 2016-01-28 MED ORDER — HEPARIN SOD (PORK) LOCK FLUSH 100 UNIT/ML IV SOLN
500.0000 [IU] | Freq: Once | INTRAVENOUS | Status: AC
  Administered 2016-01-28: 500 [IU] via INTRAVENOUS

## 2016-01-28 MED ORDER — SODIUM CHLORIDE 0.9% FLUSH
10.0000 mL | INTRAVENOUS | Status: DC | PRN
  Administered 2016-01-28: 10 mL via INTRAVENOUS
  Filled 2016-01-28: qty 10

## 2016-03-10 ENCOUNTER — Inpatient Hospital Stay: Payer: Medicare Other

## 2016-04-02 ENCOUNTER — Inpatient Hospital Stay: Payer: Medicare Other | Attending: Hematology and Oncology

## 2016-04-02 DIAGNOSIS — Z452 Encounter for adjustment and management of vascular access device: Secondary | ICD-10-CM | POA: Diagnosis not present

## 2016-04-02 DIAGNOSIS — C679 Malignant neoplasm of bladder, unspecified: Secondary | ICD-10-CM | POA: Insufficient documentation

## 2016-04-02 DIAGNOSIS — Z95828 Presence of other vascular implants and grafts: Secondary | ICD-10-CM

## 2016-04-02 MED ORDER — SODIUM CHLORIDE 0.9% FLUSH
10.0000 mL | INTRAVENOUS | Status: DC | PRN
  Administered 2016-04-02: 10 mL via INTRAVENOUS
  Filled 2016-04-02: qty 10

## 2016-04-02 MED ORDER — HEPARIN SOD (PORK) LOCK FLUSH 100 UNIT/ML IV SOLN
500.0000 [IU] | Freq: Once | INTRAVENOUS | Status: AC
  Administered 2016-04-02: 500 [IU] via INTRAVENOUS

## 2016-04-07 ENCOUNTER — Inpatient Hospital Stay: Payer: Medicare Other

## 2016-04-14 ENCOUNTER — Ambulatory Visit (INDEPENDENT_AMBULATORY_CARE_PROVIDER_SITE_OTHER): Payer: Medicare Other | Admitting: Urology

## 2016-04-14 ENCOUNTER — Encounter: Payer: Self-pay | Admitting: Urology

## 2016-04-14 VITALS — BP 157/69 | HR 69 | Ht 66.0 in | Wt 135.0 lb

## 2016-04-14 DIAGNOSIS — C672 Malignant neoplasm of lateral wall of bladder: Secondary | ICD-10-CM

## 2016-04-14 MED ORDER — LIDOCAINE HCL 2 % EX GEL
1.0000 "application " | Freq: Once | CUTANEOUS | Status: AC
  Administered 2016-04-14: 1 via URETHRAL

## 2016-04-14 MED ORDER — CIPROFLOXACIN HCL 500 MG PO TABS
500.0000 mg | ORAL_TABLET | Freq: Once | ORAL | Status: AC
  Administered 2016-04-14: 500 mg via ORAL

## 2016-04-14 NOTE — Progress Notes (Signed)
04/14/2016 9:52 AM   Colton Tyler [birth year] PO:[id number]  Referring provider: Kirk Ruths, MD [facility] [place], [place] [address]  Chief Complaint  Patient presents with  . 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  Underwent repeat TURBT 10/2015 for 2 cm area of patchy erythema involving the right lateral bladder wall beyond the right UO, heavy trabeculation, no obvious papillary lesions.  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 The suspicious area was fulgurated completely. Due to his age and frailty he declined re-TUR and cystectomy. BCG induction was started.   Last staging: CT Aug 2017. There was as suspicious are on liver, but that was benign on MRI Liver  Last BCG: Nov 2017 x 6   He is well today. No dysuria or gross hematuria.   PMH: Past Medical History:  Diagnosis Date  . Bladder cancer ([place])   . Chronic kidney disease   . Coronary artery disease   . Dementia   . Diabetes ([place])   . Dysrhythmia   . GERD (gastroesophageal reflux disease)   . Hematuria, gross 07/23/2014  . Hyperlipidemia   . Hypertension   . Pancreatic cancer [facility])    s/p whipple procedure    Surgical History: Past Surgical History:  Procedure Laterality Date  . BLADDER SURGERY    . CYSTOSCOPY WITH BIOPSY N/A 11/05/2015   Procedure: CYSTOSCOPY WITH BIOPSY;  Surgeon: [name], MD;  Location: [facility] ORS;  Service: Urology;  Laterality: N/A;  . CYSTOSCOPY WITH FULGERATION  11/05/2015   Procedure: CYSTOSCOPY WITH FULGERATION;  Surgeon: [name], MD;  Location: [facility] ORS;  Service: Urology;;  . HERNIA REPAIR Left    Inguinal Hernia Repair  . PORTACATH PLACEMENT Right 2015   [facility]  . WHIPPLE PROCEDURE    . WHIPPLE PROCEDURE      Home  Medications:  Allergies as of 04/14/2016      Reactions   Penicillins Other (See Comments)   "unknown"      Medication List       Accurate as of 04/14/16  9:52 AM. Always use your most recent med list.          amLODipine 10 MG tablet Commonly known as:  NORVASC Take 10 mg by mouth daily.   aspirin 81 MG tablet Take 81 mg by mouth daily.   LANTUS SOLOSTAR 100 UNIT/ML Solostar Pen Generic drug:  Insulin Glargine Inject into the skin.   NAMENDA 10 MG tablet Generic drug:  memantine Take 10 mg by mouth daily.   simvastatin 20 MG tablet Commonly known as:  ZOCOR Take 20 mg by mouth daily at 12 noon.   sitaGLIPtin 25 MG tablet Commonly known as:  JANUVIA Take 25 mg by mouth daily at 12 noon.       Allergies:  Allergies  Allergen Reactions  . Penicillins Other (See Comments)    "unknown"    Family History: Family History  Problem Relation Age of Onset  . Cerebrovascular Accident Father   . Stomach cancer Sister   . Kidney disease Neg Hx   . Prostate cancer Neg Hx     Social History:  reports that he has never smoked. He has never used smokeless tobacco. He reports that he does not drink alcohol or use drugs.  ROS:                                        Physical Exam: BP (!) 157/69   Pulse 69   Ht 5\' 6"  (1.676 m)   Wt 61.2 kg (135 lb)   BMI 21.79 kg/m   Constitutional:  Alert and oriented, No acute distress. HEENT: [place] AT, moist mucus membranes.  Trachea midline, no masses. Cardiovascular: No clubbing, cyanosis, or edema. Respiratory: Normal respiratory effort, no increased work of breathing. GI: Abdomen is soft, nontender, nondistended, no abdominal masses Skin: No rashes, bruises or suspicious lesions. Lymph: No cervical or inguinal adenopathy. Neurologic: Grossly intact, no focal deficits, moving all 4 extremities. Psychiatric: Normal mood and affect. Penis and meatus appear normal.   Laboratory Data: Lab Results    Component Value Date   WBC 11.4 (H) 01/16/2016   HGB 12.6 (L) 01/16/2016   HCT 38.9 (L) 01/16/2016   MCV 90.9 01/16/2016   PLT 260 01/16/2016    Lab Results  Component Value Date   CREATININE 1.46 (H) 01/16/2016    No results found for: PSA  No results found for: TESTOSTERONE  No results found for: HGBA1C  Urinalysis    Component Value Date/Time   COLORURINE YELLOW (A) 01/16/2016 2009   APPEARANCEUR CLEAR (A) 01/16/2016 2009   APPEARANCEUR Clear 01/13/2016 1419   LABSPEC 1.016 01/16/2016 2009   PHURINE 6.0 01/16/2016 2009   GLUCOSEU >500 (A) 01/16/2016 2009   HGBUR 1+ (A) 01/16/2016 2009   BILIRUBINUR NEGATIVE 01/16/2016 2009   BILIRUBINUR Negative 01/13/2016 1419   KETONESUR 1+ (A) 01/16/2016 2009   PROTEINUR NEGATIVE 01/16/2016 2009   NITRITE NEGATIVE 01/16/2016 2009   LEUKOCYTESUR NEGATIVE 01/16/2016 2009   LEUKOCYTESUR Negative 01/13/2016 1419     Cystoscopy Procedure Note  Patient identification was confirmed, informed consent was obtained, and patient was prepped using Betadine solution.  Lidocaine jelly was administered per urethral meatus.    Preoperative abx where received prior to procedure.     Pre-Procedure: - Inspection reveals a normal caliber ureteral meatus.  Procedure: The flexible cystoscope was introduced without difficulty - No urethral strictures/lesions are present. - normal prostate - short, non-obstructing  - patent bladder neck - Bilateral ureteral orifices identified - Bladder mucosa  reveals no ulcers, tumors, or lesions - No bladder stones - moderate trabeculation  Retroflexion shows normal mucosa, bladder neck, no intravesical prostate    Post-Procedure: - Patient tolerated the procedure well  Assessment/ Plan:   Assessment & Plan:    1. Malignant neoplasm of urinary bladder, unspecified site [facility]) -  Normal cystoscopy today.  -plan cysto in 3 months/May 2018 - if clear proceed with BCG maintenance x 3.   -  ciprofloxacin (CIPRO) tablet 500 mg; Take 1 tablet (500 mg total) by mouth once. - lidocaine (XYLOCAINE) 2 % jelly 1 application; Place 1 application into the urethra once.   No Follow-up on file.  Festus Aloe, [facility] [address], [place], [place] [address] [phone]

## 2016-04-28 ENCOUNTER — Inpatient Hospital Stay: Payer: Medicare Other

## 2016-05-04 ENCOUNTER — Inpatient Hospital Stay: Payer: Medicare Other | Attending: Hematology and Oncology

## 2016-05-04 DIAGNOSIS — Z452 Encounter for adjustment and management of vascular access device: Secondary | ICD-10-CM | POA: Diagnosis not present

## 2016-05-04 DIAGNOSIS — C679 Malignant neoplasm of bladder, unspecified: Secondary | ICD-10-CM | POA: Insufficient documentation

## 2016-05-04 DIAGNOSIS — Z95828 Presence of other vascular implants and grafts: Secondary | ICD-10-CM

## 2016-05-04 MED ORDER — HEPARIN SOD (PORK) LOCK FLUSH 100 UNIT/ML IV SOLN
500.0000 [IU] | Freq: Once | INTRAVENOUS | Status: AC
  Administered 2016-05-04: 500 [IU] via INTRAVENOUS

## 2016-05-04 MED ORDER — SODIUM CHLORIDE 0.9% FLUSH
10.0000 mL | INTRAVENOUS | Status: DC | PRN
  Administered 2016-05-04: 10 mL via INTRAVENOUS
  Filled 2016-05-04: qty 10

## 2016-06-16 ENCOUNTER — Inpatient Hospital Stay: Payer: Medicare Other

## 2016-06-18 ENCOUNTER — Inpatient Hospital Stay: Payer: Medicare Other | Attending: Hematology and Oncology

## 2016-06-18 DIAGNOSIS — C679 Malignant neoplasm of bladder, unspecified: Secondary | ICD-10-CM | POA: Diagnosis not present

## 2016-06-18 DIAGNOSIS — Z452 Encounter for adjustment and management of vascular access device: Secondary | ICD-10-CM | POA: Insufficient documentation

## 2016-06-18 MED ORDER — SODIUM CHLORIDE 0.9% FLUSH
10.0000 mL | INTRAVENOUS | Status: DC | PRN
  Administered 2016-06-18: 10 mL via INTRAVENOUS
  Filled 2016-06-18: qty 10

## 2016-06-18 MED ORDER — HEPARIN SOD (PORK) LOCK FLUSH 100 UNIT/ML IV SOLN
500.0000 [IU] | Freq: Once | INTRAVENOUS | Status: AC
  Administered 2016-06-18: 500 [IU] via INTRAVENOUS

## 2016-07-15 ENCOUNTER — Encounter: Payer: Self-pay | Admitting: Urology

## 2016-07-15 ENCOUNTER — Ambulatory Visit (INDEPENDENT_AMBULATORY_CARE_PROVIDER_SITE_OTHER): Payer: Medicare Other | Admitting: Urology

## 2016-07-15 VITALS — BP 112/55 | HR 40 | Ht 66.0 in | Wt 128.7 lb

## 2016-07-15 DIAGNOSIS — C672 Malignant neoplasm of lateral wall of bladder: Secondary | ICD-10-CM

## 2016-07-15 LAB — MICROSCOPIC EXAMINATION
BACTERIA UA: NONE SEEN
EPITHELIAL CELLS (NON RENAL): NONE SEEN /HPF (ref 0–10)

## 2016-07-15 LAB — URINALYSIS, COMPLETE
Bilirubin, UA: NEGATIVE
GLUCOSE, UA: NEGATIVE
Ketones, UA: NEGATIVE
Nitrite, UA: NEGATIVE
PROTEIN UA: NEGATIVE
RBC, UA: NEGATIVE
Specific Gravity, UA: <1.005 — ABNORMAL LOW (ref 1.005–1.030)
UUROB: 0.2 mg/dL (ref 0.2–1.0)
pH, UA: 5 (ref 5.0–7.5)

## 2016-07-15 MED ORDER — CIPROFLOXACIN HCL 500 MG PO TABS
500.0000 mg | ORAL_TABLET | Freq: Once | ORAL | Status: AC
  Administered 2016-07-15: 500 mg via ORAL

## 2016-07-15 MED ORDER — LIDOCAINE HCL 2 % EX GEL
1.0000 "application " | Freq: Once | CUTANEOUS | Status: AC
  Administered 2016-07-15: 1 via URETHRAL

## 2016-07-15 NOTE — Progress Notes (Signed)
.     07/15/16  CC:  Chief Complaint  Patient presents with  . 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  Underwent repeat TURBT 10/2015 for 2 cm area of patchy erythema involving the right lateral bladder wall beyond the right UO, heavy trabeculation, no obvious papillary lesions. 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 The suspicious area was fulgurated completely. Due to his age and frailty he declined re-TUR and cystectomy. BCG induction was started.   Last staging: CT Aug 2017. There was as suspicious are on liver, but that was benign on MRI Liver  Last BCG: Nov 2017 x 6  Last cysto: Feb 2018 negative   He is well today.   Blood pressure (!) 112/55, pulse (!) 40, height 5\' 6"  (1.676 m), weight 58.4 kg (128 lb 11.2 oz). NED. A&Ox3.   No respiratory distress   Abd soft, NT, ND Normal phallus with bilateral descended testicles  Cystoscopy Procedure Note  Patient identification was confirmed, informed consent was obtained, and patient was prepped using Betadine solution.  Lidocaine jelly was administered per urethral meatus.    Preoperative abx where received prior to procedure.     Pre-Procedure: - Inspection reveals a normal caliber ureteral meatus.  Procedure: The flexible cystoscope was introduced without difficulty - No urethral strictures/lesions are present. - prostate short and non-obstructive - bladder neck normal  - Bilateral ureteral orifices identified - Bladder mucosa  reveals no ulcers, tumors, or lesions - No bladder stones - moderate trabeculation  Retroflexion shows normal bladder neck, normal view    Post-Procedure: - Patient tolerated the procedure well  Assessment/ Plan: Bladder ca - T2 with a T1 recurrence currently NED: -plan surveillance  CT -BCG #1 of 3 next 1-2 weeks  -f/u for cysto in 3-4 months

## 2016-07-16 ENCOUNTER — Other Ambulatory Visit: Payer: Medicare Other

## 2016-07-16 ENCOUNTER — Ambulatory Visit: Payer: Medicare Other | Admitting: Hematology and Oncology

## 2016-07-21 ENCOUNTER — Ambulatory Visit (INDEPENDENT_AMBULATORY_CARE_PROVIDER_SITE_OTHER): Payer: Medicare Other | Admitting: Urology

## 2016-07-21 ENCOUNTER — Encounter: Payer: Self-pay | Admitting: Urology

## 2016-07-21 VITALS — BP 102/50 | HR 79 | Ht 66.0 in | Wt 131.3 lb

## 2016-07-21 DIAGNOSIS — C672 Malignant neoplasm of lateral wall of bladder: Secondary | ICD-10-CM | POA: Diagnosis not present

## 2016-07-21 LAB — URINALYSIS, COMPLETE
Bilirubin, UA: NEGATIVE
Glucose, UA: NEGATIVE
Ketones, UA: NEGATIVE
Leukocytes, UA: NEGATIVE
Nitrite, UA: NEGATIVE
SPEC GRAV UA: 1.015 (ref 1.005–1.030)
UUROB: 0.2 mg/dL (ref 0.2–1.0)
pH, UA: 5.5 (ref 5.0–7.5)

## 2016-07-21 LAB — MICROSCOPIC EXAMINATION: WBC UA: NONE SEEN /HPF (ref 0–?)

## 2016-07-21 MED ORDER — BCG LIVE 50 MG IS SUSR
3.2400 mL | Freq: Once | INTRAVESICAL | Status: AC
  Administered 2016-07-21: 81 mg via INTRAVESICAL

## 2016-07-21 NOTE — Progress Notes (Signed)
BCG Bladder Instillation  BCG # 1 of 3 maintenance  Due to Bladder Cancer patient is present today for a BCG treatment. Patient was cleaned and prepped in a sterile fashion with betadine and lidocaine 2% jelly was instilled into the urethra.  A 14FR catheter was inserted, urine return was noted 77ml, urine was yellow in color.  30ml of reconstituted BCG was instilled into the bladder. The catheter was then removed. Patient tolerated well, no complications were noted  Preformed by: Elberta Leatherwood, CMA  Follow up/ Additional notes: 1 week BCG #2

## 2016-07-27 ENCOUNTER — Ambulatory Visit
Admission: RE | Admit: 2016-07-27 | Discharge: 2016-07-27 | Disposition: A | Discharge status: Home / Self Care | Payer: Medicare Other | Source: Ambulatory Visit | Attending: Urology | Admitting: Urology

## 2016-07-27 DIAGNOSIS — Z90411 Acquired partial absence of pancreas: Secondary | ICD-10-CM | POA: Insufficient documentation

## 2016-07-27 DIAGNOSIS — I700 Atherosclerosis of aorta: Secondary | ICD-10-CM | POA: Insufficient documentation

## 2016-07-27 DIAGNOSIS — M4856XA Collapsed vertebra, not elsewhere classified, lumbar region, initial encounter for fracture: Secondary | ICD-10-CM

## 2016-07-27 DIAGNOSIS — C672 Malignant neoplasm of lateral wall of bladder: Secondary | ICD-10-CM | POA: Insufficient documentation

## 2016-07-27 LAB — POCT I-STAT CREATININE: Creatinine, Ser: 1.5 mg/dL — ABNORMAL HIGH (ref 0.61–1.24)

## 2016-07-27 MED ORDER — IOPAMIDOL (ISOVUE-300) INJECTION 61%
100.0000 mL | Freq: Once | INTRAVENOUS | Status: AC | PRN
  Administered 2016-07-27: 100 mL via INTRAVENOUS

## 2016-07-29 ENCOUNTER — Ambulatory Visit (INDEPENDENT_AMBULATORY_CARE_PROVIDER_SITE_OTHER): Payer: Medicare Other | Admitting: Urology

## 2016-07-29 ENCOUNTER — Encounter: Payer: Self-pay | Admitting: Urology

## 2016-07-29 VITALS — BP 130/63 | HR 90 | Ht 67.0 in | Wt 133.1 lb

## 2016-07-29 DIAGNOSIS — C679 Malignant neoplasm of bladder, unspecified: Secondary | ICD-10-CM

## 2016-07-29 LAB — URINALYSIS, COMPLETE
Bilirubin, UA: NEGATIVE
Glucose, UA: NEGATIVE
KETONES UA: NEGATIVE
NITRITE UA: NEGATIVE
PH UA: 6 (ref 5.0–7.5)
RBC, UA: NEGATIVE
Specific Gravity, UA: 1.01 (ref 1.005–1.030)
Urobilinogen, Ur: 0.2 mg/dL (ref 0.2–1.0)

## 2016-07-29 LAB — MICROSCOPIC EXAMINATION: EPITHELIAL CELLS (NON RENAL): NONE SEEN /HPF (ref 0–10)

## 2016-07-29 MED ORDER — LIDOCAINE HCL 2 % EX GEL
1.0000 "application " | Freq: Once | CUTANEOUS | Status: AC
  Administered 2016-07-29: 1 via URETHRAL

## 2016-07-29 MED ORDER — BCG LIVE 50 MG IS SUSR
3.2400 mL | Freq: Once | INTRAVESICAL | Status: AC
  Administered 2016-07-29: 81 mg via INTRAVESICAL

## 2016-07-29 NOTE — Progress Notes (Signed)
BCG Bladder Instillation  BCG # 2 of 3 Maint  Due to Bladder Cancer patient is present today for a BCG treatment. Patient was cleaned and prepped in a sterile fashion with betadine and lidocaine 2% jelly was instilled into the urethra.  A 14FR catheter was inserted, urine return was noted 157ml, urine was yellow in color.  26ml of reconstituted BCG was instilled into the bladder. The catheter was then removed. Patient tolerated well, no complications were noted.  Preformed by: Elberta Leatherwood CMA and Lyndee Hensen CMA  Follow up/ Additional notes: One week for # 3 of 3

## 2016-07-30 ENCOUNTER — Inpatient Hospital Stay
Admission: AD | Admit: 2016-07-30 | Discharge: 2016-08-01 | DRG: 379 | Disposition: A | Discharge status: Home / Self Care | Payer: Medicare Other | Source: Ambulatory Visit | Attending: Internal Medicine | Admitting: Internal Medicine

## 2016-07-30 ENCOUNTER — Telehealth: Payer: Self-pay | Admitting: *Deleted

## 2016-07-30 ENCOUNTER — Inpatient Hospital Stay (HOSPITAL_BASED_OUTPATIENT_CLINIC_OR_DEPARTMENT_OTHER): Payer: Medicare Other | Admitting: Hematology and Oncology

## 2016-07-30 ENCOUNTER — Inpatient Hospital Stay: Payer: Medicare Other | Attending: Hematology and Oncology

## 2016-07-30 VITALS — BP 104/57 | HR 84 | Temp 96.8°F | Resp 18 | Wt 132.3 lb

## 2016-07-30 DIAGNOSIS — Z90411 Acquired partial absence of pancreas: Secondary | ICD-10-CM | POA: Diagnosis not present

## 2016-07-30 DIAGNOSIS — I129 Hypertensive chronic kidney disease with stage 1 through stage 4 chronic kidney disease, or unspecified chronic kidney disease: Secondary | ICD-10-CM | POA: Insufficient documentation

## 2016-07-30 DIAGNOSIS — D120 Benign neoplasm of cecum: Secondary | ICD-10-CM

## 2016-07-30 DIAGNOSIS — Z794 Long term (current) use of insulin: Secondary | ICD-10-CM

## 2016-07-30 DIAGNOSIS — K5730 Diverticulosis of large intestine without perforation or abscess without bleeding: Secondary | ICD-10-CM | POA: Diagnosis present

## 2016-07-30 DIAGNOSIS — D649 Anemia, unspecified: Secondary | ICD-10-CM | POA: Diagnosis present

## 2016-07-30 DIAGNOSIS — D696 Thrombocytopenia, unspecified: Secondary | ICD-10-CM | POA: Insufficient documentation

## 2016-07-30 DIAGNOSIS — Z79899 Other long term (current) drug therapy: Secondary | ICD-10-CM | POA: Insufficient documentation

## 2016-07-30 DIAGNOSIS — F0390 Unspecified dementia without behavioral disturbance: Secondary | ICD-10-CM | POA: Insufficient documentation

## 2016-07-30 DIAGNOSIS — Z7982 Long term (current) use of aspirin: Secondary | ICD-10-CM

## 2016-07-30 DIAGNOSIS — C672 Malignant neoplasm of lateral wall of bladder: Secondary | ICD-10-CM

## 2016-07-30 DIAGNOSIS — K31811 Angiodysplasia of stomach and duodenum with bleeding: Principal | ICD-10-CM

## 2016-07-30 DIAGNOSIS — N189 Chronic kidney disease, unspecified: Secondary | ICD-10-CM | POA: Insufficient documentation

## 2016-07-30 DIAGNOSIS — Z880 Allergy status to penicillin: Secondary | ICD-10-CM

## 2016-07-30 DIAGNOSIS — C679 Malignant neoplasm of bladder, unspecified: Secondary | ICD-10-CM | POA: Diagnosis not present

## 2016-07-30 DIAGNOSIS — K219 Gastro-esophageal reflux disease without esophagitis: Secondary | ICD-10-CM | POA: Insufficient documentation

## 2016-07-30 DIAGNOSIS — I2510 Atherosclerotic heart disease of native coronary artery without angina pectoris: Secondary | ICD-10-CM | POA: Insufficient documentation

## 2016-07-30 DIAGNOSIS — Z8507 Personal history of malignant neoplasm of pancreas: Secondary | ICD-10-CM | POA: Diagnosis not present

## 2016-07-30 DIAGNOSIS — D509 Iron deficiency anemia, unspecified: Secondary | ICD-10-CM | POA: Diagnosis present

## 2016-07-30 DIAGNOSIS — M4856XA Collapsed vertebra, not elsewhere classified, lumbar region, initial encounter for fracture: Secondary | ICD-10-CM | POA: Insufficient documentation

## 2016-07-30 DIAGNOSIS — Z8551 Personal history of malignant neoplasm of bladder: Secondary | ICD-10-CM

## 2016-07-30 DIAGNOSIS — D500 Iron deficiency anemia secondary to blood loss (chronic): Secondary | ICD-10-CM

## 2016-07-30 DIAGNOSIS — E785 Hyperlipidemia, unspecified: Secondary | ICD-10-CM | POA: Insufficient documentation

## 2016-07-30 DIAGNOSIS — Z95828 Presence of other vascular implants and grafts: Secondary | ICD-10-CM

## 2016-07-30 DIAGNOSIS — N183 Chronic kidney disease, stage 3 (moderate): Secondary | ICD-10-CM | POA: Diagnosis present

## 2016-07-30 DIAGNOSIS — E1122 Type 2 diabetes mellitus with diabetic chronic kidney disease: Secondary | ICD-10-CM | POA: Insufficient documentation

## 2016-07-30 LAB — CBC WITH DIFFERENTIAL/PLATELET
Basophils Absolute: 0 10*3/uL (ref 0–0.1)
Basophils Absolute: 0.1 10*3/uL (ref 0–0.1)
Basophils Relative: 1 %
Basophils Relative: 1 %
Eosinophils Absolute: 0 10*3/uL (ref 0–0.7)
Eosinophils Absolute: 0.1 10*3/uL (ref 0–0.7)
Eosinophils Relative: 1 %
Eosinophils Relative: 2 %
HCT: 16.9 % — ABNORMAL LOW (ref 40.0–52.0)
HCT: 16.9 % — ABNORMAL LOW (ref 40.0–52.0)
Hemoglobin: 5.4 g/dL — ABNORMAL LOW (ref 13.0–18.0)
Hemoglobin: 5.3 g/dL — ABNORMAL LOW (ref 13.0–18.0)
Lymphocytes Relative: 9 %
Lymphocytes Relative: 9 %
Lymphs Abs: 0.5 10*3/uL — ABNORMAL LOW (ref 1.0–3.6)
Lymphs Abs: 0.5 10*3/uL — ABNORMAL LOW (ref 1.0–3.6)
MCH: 26.9 pg (ref 26.0–34.0)
MCH: 27 pg (ref 26.0–34.0)
MCHC: 31.7 g/dL — ABNORMAL LOW (ref 32.0–36.0)
MCHC: 31.5 g/dL — ABNORMAL LOW (ref 32.0–36.0)
MCV: 85.1 fL (ref 80.0–100.0)
MCV: 85.8 fL (ref 80.0–100.0)
Monocytes Absolute: 0.6 10*3/uL (ref 0.2–1.0)
Monocytes Absolute: 0.7 10*3/uL (ref 0.2–1.0)
Monocytes Relative: 12 %
Monocytes Relative: 12 %
Neutro Abs: 3.9 10*3/uL (ref 1.4–6.5)
Neutro Abs: 4.1 10*3/uL (ref 1.4–6.5)
Neutrophils Relative %: 77 %
Neutrophils Relative %: 76 %
Platelets: 150 10*3/uL (ref 150–440)
Platelets: 155 10*3/uL (ref 150–440)
RBC: 1.99 MIL/uL — ABNORMAL LOW (ref 4.40–5.90)
RBC: 1.97 MIL/uL — ABNORMAL LOW (ref 4.40–5.90)
RDW: 18.8 % — ABNORMAL HIGH (ref 11.5–14.5)
RDW: 18.8 % — ABNORMAL HIGH (ref 11.5–14.5)
WBC: 5.1 10*3/uL (ref 3.8–10.6)
WBC: 5.4 10*3/uL (ref 3.8–10.6)

## 2016-07-30 LAB — IRON AND TIBC
Iron: 14 ug/dL — ABNORMAL LOW (ref 45–182)
Saturation Ratios: 4 % — ABNORMAL LOW (ref 17.9–39.5)
TIBC: 368 ug/dL (ref 250–450)
UIBC: 354 ug/dL

## 2016-07-30 LAB — FERRITIN: Ferritin: 10 ng/mL — ABNORMAL LOW (ref 24–336)

## 2016-07-30 LAB — COMPREHENSIVE METABOLIC PANEL
ALT: 16 U/L — ABNORMAL LOW (ref 17–63)
AST: 23 U/L (ref 15–41)
Albumin: 3.3 g/dL — ABNORMAL LOW (ref 3.5–5.0)
Alkaline Phosphatase: 103 U/L (ref 38–126)
Anion gap: 5 (ref 5–15)
BUN: 29 mg/dL — ABNORMAL HIGH (ref 6–20)
CO2: 25 mmol/L (ref 22–32)
Calcium: 8.4 mg/dL — ABNORMAL LOW (ref 8.9–10.3)
Chloride: 105 mmol/L (ref 101–111)
Creatinine, Ser: 1.55 mg/dL — ABNORMAL HIGH (ref 0.61–1.24)
GFR calc Af Amer: 44 mL/min — ABNORMAL LOW (ref >60)
GFR calc non Af Amer: 38 mL/min — ABNORMAL LOW (ref >60)
Glucose, Bld: 203 mg/dL — ABNORMAL HIGH (ref 65–99)
Potassium: 4.2 mmol/L (ref 3.5–5.1)
Sodium: 135 mmol/L (ref 135–145)
Total Bilirubin: 0.4 mg/dL (ref 0.3–1.2)
Total Protein: 5.8 g/dL — ABNORMAL LOW (ref 6.5–8.1)

## 2016-07-30 LAB — RETICULOCYTES
RBC.: 2.03 MIL/uL — ABNORMAL LOW (ref 4.40–5.90)
Retic Count, Absolute: 60.9 10*3/uL (ref 19.0–183.0)
Retic Ct Pct: 3 % (ref 0.4–3.1)

## 2016-07-30 LAB — DAT, POLYSPECIFIC AHG (ARMC ONLY): Polyspecific AHG test: NEGATIVE

## 2016-07-30 LAB — GLUCOSE, CAPILLARY: Glucose-Capillary: 143 mg/dL — ABNORMAL HIGH (ref 65–99)

## 2016-07-30 LAB — ABO/RH: ABO/RH(D): O POS

## 2016-07-30 LAB — PREPARE RBC (CROSSMATCH)

## 2016-07-30 MED ORDER — SODIUM CHLORIDE 0.9% FLUSH: 10.0000 mL | INTRAVENOUS | Status: DC | PRN

## 2016-07-30 MED ORDER — SODIUM CHLORIDE 0.9% FLUSH
10.0000 mL | Freq: Two times a day (BID) | INTRAVENOUS | Status: DC
  Administered 2016-07-30 – 2016-08-01 (×4): 10 mL

## 2016-07-30 MED ORDER — SIMVASTATIN 20 MG PO TABS: 20.0000 mg | ORAL_TABLET | Freq: Every day | ORAL | Status: DC

## 2016-07-30 MED ORDER — ONDANSETRON HCL 4 MG PO TABS: 4.0000 mg | ORAL_TABLET | Freq: Four times a day (QID) | ORAL | Status: DC | PRN

## 2016-07-30 MED ORDER — POLYETHYLENE GLYCOL 3350 17 GM/SCOOP PO POWD
1.0000 | Freq: Once | ORAL | Status: AC
  Administered 2016-07-30: 23:00:00 255 g via ORAL
  Filled 2016-07-30: qty 255

## 2016-07-30 MED ORDER — ONDANSETRON HCL 4 MG/2ML IJ SOLN: 4.0000 mg | Freq: Four times a day (QID) | INTRAMUSCULAR | Status: DC | PRN

## 2016-07-30 MED ORDER — HEPARIN SOD (PORK) LOCK FLUSH 100 UNIT/ML IV SOLN
500.0000 [IU] | Freq: Once | INTRAVENOUS | Status: AC
  Administered 2016-07-30: 500 [IU] via INTRAVENOUS

## 2016-07-30 MED ORDER — INSULIN ASPART 100 UNIT/ML ~~LOC~~ SOLN: 0.0000 [IU] | Freq: Three times a day (TID) | SUBCUTANEOUS | Status: DC

## 2016-07-30 MED ORDER — POLYETHYLENE GLYCOL 3350 17 G PO PACK
17.0000 g | PACK | Freq: Every day | ORAL | Status: DC | PRN
  Administered 2016-07-30: 17 g via ORAL
  Filled 2016-07-30: qty 1

## 2016-07-30 MED ORDER — ACETAMINOPHEN 650 MG RE SUPP: 650.0000 mg | Freq: Four times a day (QID) | RECTAL | Status: DC | PRN

## 2016-07-30 MED ORDER — SODIUM CHLORIDE 0.9% FLUSH
10.0000 mL | INTRAVENOUS | Status: DC | PRN
  Administered 2016-07-30: 10 mL via INTRAVENOUS
  Filled 2016-07-30: qty 10

## 2016-07-30 MED ORDER — ALBUTEROL SULFATE (2.5 MG/3ML) 0.083% IN NEBU: 2.5000 mg | INHALATION_SOLUTION | RESPIRATORY_TRACT | Status: DC | PRN

## 2016-07-30 MED ORDER — ACETAMINOPHEN 325 MG PO TABS: 650.0000 mg | ORAL_TABLET | Freq: Four times a day (QID) | ORAL | Status: DC | PRN

## 2016-07-30 MED ORDER — INSULIN GLARGINE 100 UNIT/ML ~~LOC~~ SOLN
8.0000 [IU] | Freq: Every day | SUBCUTANEOUS | Status: DC
  Administered 2016-07-30: 23:00:00 8 [IU] via SUBCUTANEOUS
  Filled 2016-07-30 (×2): qty 0.08

## 2016-07-30 MED ORDER — MEMANTINE HCL 5 MG PO TABS
10.0000 mg | ORAL_TABLET | Freq: Two times a day (BID) | ORAL | Status: DC
  Administered 2016-07-30 – 2016-08-01 (×3): 10 mg via ORAL
  Filled 2016-07-30 (×3): qty 2

## 2016-07-30 MED ORDER — SODIUM CHLORIDE 0.9 % IV SOLN
Freq: Once | INTRAVENOUS | Status: AC
  Administered 2016-07-30: 23:00:00 via INTRAVENOUS

## 2016-07-30 MED ORDER — HYDRALAZINE HCL 20 MG/ML IJ SOLN: 10.0000 mg | Freq: Four times a day (QID) | INTRAMUSCULAR | Status: DC | PRN

## 2016-07-30 NOTE — Addendum Note (Signed)
Addended by: Livia Snellen on: 07/30/2016 03:44 PM   Modules accepted: Orders

## 2016-07-30 NOTE — Progress Notes (Signed)
Patient offers no complaints today.  Patient had 2nd PCG treatment yesterday. Also had CT on Monday.

## 2016-07-30 NOTE — Telephone Encounter (Signed)
Report called to Bretta Bang RN on 1-C, pt to be admitted to room 124

## 2016-07-30 NOTE — Consult Note (Addendum)
[name], MD [facility]  [address] [place], [place] [address] Phone: [phone] Fax : [phone]  Consultation  Referring Provider:     Dr. Darvin Neighbours Primary Care Physician:  Kirk Ruths, MD Primary Gastroenterologist:  Althia Forts         Reason for Consultation:     Anemia  Date of Admission:  07/30/2016 Date of Consultation:  07/30/2016         HPI:   Colton Tyler is a 83 y.o. male who comes in with significant anemia. The patient had been feeling weak for 2 weeks. The patient was found to have a hemoglobin of 5.3 with a hemoglobin of 12.66 months ago. The patient's iron studies were also low with a low ferritin and iron saturation of 4%. The patient has a history of bladder cancer and pancreatic cancer. I'm now being asked to see the patient for his iron deficiency anemia.  Past Medical History:  Diagnosis Date  . Bladder cancer ([place])   . Chronic kidney disease   . Coronary artery disease   . Dementia   . Diabetes ([place])   . Dysrhythmia   . GERD (gastroesophageal reflux disease)   . Hematuria, gross 07/23/2014  . Hyperlipidemia   . Hypertension   . Pancreatic cancer [facility])    s/p whipple procedure    Past Surgical History:  Procedure Laterality Date  . BLADDER SURGERY    . CYSTOSCOPY WITH BIOPSY N/A 11/05/2015   Procedure: CYSTOSCOPY WITH BIOPSY;  Surgeon: [name], MD;  Location: [facility] ORS;  Service: Urology;  Laterality: N/A;  . CYSTOSCOPY WITH FULGERATION  11/05/2015   Procedure: CYSTOSCOPY WITH FULGERATION;  Surgeon: [name], MD;  Location: [facility] ORS;  Service: Urology;;  . HERNIA REPAIR Left    Inguinal Hernia Repair  . PORTACATH PLACEMENT Right 2015   [facility]  . WHIPPLE PROCEDURE    . WHIPPLE PROCEDURE      Prior to Admission medications   Medication Sig Start Date End Date Taking? Authorizing Provider  amLODipine (NORVASC) 10 MG tablet Take 10 mg by mouth daily.  02/12/15   [provider]  aspirin 81 MG tablet Take 81  mg by mouth daily.     [provider]  Insulin Glargine (LANTUS SOLOSTAR) 100 UNIT/ML Solostar Pen Inject 20 Units into the skin.  12/24/14   [provider]  memantine (NAMENDA) 10 MG tablet Take 10 mg by mouth 2 (two) times daily.  02/12/15   [provider]  simvastatin (ZOCOR) 20 MG tablet Take 20 mg by mouth daily at 12 noon.  02/12/15   [provider]  sitaGLIPtin (JANUVIA) 25 MG tablet Take 25 mg by mouth daily at 12 noon.     [provider]    Family History  Problem Relation Age of Onset  . Cerebrovascular Accident Father   . Stomach cancer Sister   . Kidney disease Neg Hx   . Prostate cancer Neg Hx   . Kidney cancer Neg Hx   . Bladder Cancer Neg Hx      Social History  Substance Use Topics  . Smoking status: Never Smoker  . Smokeless tobacco: Never Used  . Alcohol use No    Allergies as of 07/30/2016 - Review Complete 07/30/2016  Allergen Reaction Noted  . Penicillins Other (See Comments) 06/09/2014    Review of Systems:    All systems reviewed and negative except where noted in HPI.   Physical Exam:  Vital  signs in last 24 hours: Temp:  [96.8 F (36 C)-97.9 F (36.6 C)] 97.9 F (36.6 C) (05/24 1655) Pulse Rate:  [83-84] 83 (05/24 1655) Resp:  [16-18] 16 (05/24 1655) BP: (104-171)/(45-57) 171/45 (05/24 1655) SpO2:  [100 %] 100 % (05/24 1655) Weight:  [132 lb 5 oz (60 kg)-132 lb 8 oz (60.1 kg)] 132 lb 8 oz (60.1 kg) (05/24 1650)   General:   Pleasant, cooperative in NAD Head:  Normocephalic and atraumatic. Eyes:   No icterus.   Conjunctiva pink. PERRLA. Ears:  Normal auditory acuity. Neck:  Supple; no masses or thyroidomegaly Lungs: Respirations even and unlabored. Lungs clear to auscultation bilaterally.   No wheezes, crackles, or rhonchi.  Heart:  Regular rate and rhythm;  Without murmur, clicks, rubs or gallops Abdomen:  Soft, nondistended, nontender. Normal bowel sounds. No appreciable masses or  hepatomegaly.  No rebound or guarding.  Rectal:  Not performed. Msk:  Symmetrical without gross deformities.    Extremities:  Without edema, cyanosis or clubbing. Neurologic:  Alert and oriented x3;  grossly normal neurologically. Skin:  Intact without significant lesions or rashes. Cervical Nodes:  No significant cervical adenopathy. Psych:  Alert and cooperative. Normal affect.  LAB RESULTS:  Recent Labs  07/30/16 1432 07/30/16 1531  WBC 5.1 5.4  HGB 5.4* 5.3*  HCT 16.9* 16.9*  PLT 150 155   BMET  Recent Labs  07/30/16 1432  NA 135  K 4.2  CL 105  CO2 25  GLUCOSE 203*  BUN 29*  CREATININE 1.55*  CALCIUM 8.4*   LFT  Recent Labs  07/30/16 1432  PROT 5.8*  ALBUMIN 3.3*  AST 23  ALT 16*  ALKPHOS 103  BILITOT 0.4   PT/INR No results for input(s): LABPROT, INR in the last 72 hours.  STUDIES: No results found.    Impression / Plan:   Colton Tyler is a 83 y.o. y/o male with Profound anemia with low iron studies. The patient has a history of bladder cancer pancreatic cancer. The patient denies any black stools or bloody stools. He also denies any abdominal pain associated with his anemia. The patient will be set up for a EGD and colonoscopy for tomorrow. I have discussed risks & benefits which include, but are not limited to, bleeding, infection, perforation & drug reaction.  The patient agrees with this plan & written consent will be obtained.      Thank you for involving me in the care of this patient.      LOS: 0 days   [name], MD  07/30/2016, 6:00 PM   Note: This dictation was prepared with Dragon dictation along with smaller phrase technology. Any transcriptional errors that result from this process are unintentional.

## 2016-07-30 NOTE — Progress Notes (Signed)
[facility] day:  07/30/16   Chief Complaint: Colton Tyler is an 83 y.o. male with bladder cancer who is seen for 6 month assessment.  HPI: The patient was last seen in the medical oncology clinic on  01/16/2016.  At that time, he was fatigued.  He fell 2 weeks prior and had persistent hip pain.  He denied any hematuria.   Cystoscopy on 11/05/2015 revealed a 2 cm area of patchy erythema involving the right lateral bladder wall.  The suspicious area was fulgurated completely.  Pathology revealed invasive urothelial carcinoma invading the superficial lamina propria (stage T1).  He received BCG treatment and was scheduled for follow-up cystoscopy in 04/2016.  He was seen by Dr. Festus Aloe on 04/14/2016 and 07/15/2016.  He had a second BCG treatment yesterday.  Abdomen and pelvic CT scan on 07/27/2016 reveals stable appearance of the pancreas status post Whipple procedure. There was no evidence of metastatic disease. There was bladder wall thickening and trabeculation without focal lesion. There were lumbar compression deformities. The L2 compression fracture was new.   Symptomatically, he feels "okay". He denies any low back pain.   Past Medical History:  Diagnosis Date  . Bladder cancer ([place])   . Chronic kidney disease   . Coronary artery disease   . Dementia   . Diabetes ([place])   . Dysrhythmia   . GERD (gastroesophageal reflux disease)   . Hematuria, gross 07/23/2014  . Hyperlipidemia   . Hypertension   . Pancreatic cancer [facility])    s/p whipple procedure   Past Surgical History:  Procedure Laterality Date  . BLADDER SURGERY    . CYSTOSCOPY WITH BIOPSY N/A 11/05/2015   Procedure: CYSTOSCOPY WITH BIOPSY;  Surgeon: [name], MD;  Location: [facility] ORS;  Service: Urology;  Laterality: N/A;  . CYSTOSCOPY WITH FULGERATION  11/05/2015   Procedure: CYSTOSCOPY WITH FULGERATION;  Surgeon: [name], MD;  Location: [facility] ORS;  Service:  Urology;;  . HERNIA REPAIR Left    Inguinal Hernia Repair  . PORTACATH PLACEMENT Right 2015   [facility]  . WHIPPLE PROCEDURE    . WHIPPLE PROCEDURE     Family History  Problem Relation Age of Onset  . Cerebrovascular Accident Father   . Stomach cancer Sister   . Kidney disease Neg Hx   . Prostate cancer Neg Hx   . Kidney cancer Neg Hx   . Bladder Cancer Neg Hx    Social History:  reports that he has never smoked. He has never used smokeless tobacco. He reports that he does not drink alcohol or use drugs.  The patient is accompanied by his daughter, [name], and later by his wife.  Allergies:  Allergies  Allergen Reactions  . Penicillins Other (See Comments)    "unknown"   Current Medications: Current Outpatient Prescriptions  Medication Sig Dispense Refill  . aspirin 81 MG tablet Take 81 mg by mouth daily.     . Insulin Glargine (LANTUS SOLOSTAR) 100 UNIT/ML Solostar Pen Inject 20 Units into the skin.     . memantine (NAMENDA) 10 MG tablet Take 10 mg by mouth 2 (two) times daily.     . simvastatin (ZOCOR) 20 MG tablet Take 20 mg by mouth daily at 12 noon.     . sitaGLIPtin (JANUVIA) 25 MG tablet Take 25 mg by mouth daily at 12 noon.     Marland Kitchen amLODipine (NORVASC) 10 MG tablet Take 10 mg by mouth daily.  No current facility-administered medications for this visit.    Facility-Administered Medications Ordered in Other Visits  Medication Dose Route Frequency Provider Last Rate Last Dose  . sodium chloride 0.9 % injection 10 mL  10 mL Intravenous PRN Forest Gleason, MD   10 mL at 07/27/14 1136   Review of Systems:  GENERAL:  Feels "ok".  No fevers or sweats.  Weight down 6 pounds in past 6 months. PERFORMANCE STATUS (ECOG):  2 HEENT:  No visual changes, runny nose, sore throat, mouth sores or tenderness. Lungs: No shortness of breath or cough.  No hemoptysis. Cardiac:  No chest pain, palpitations, orthopnea, or PND. GI:  No nausea, vomiting, diarrhea, constipation, melena or  hematochezia. GU:  No urgency, frequency, dysuria, or hematuria.  BCG treatment complete. Musculoskeletal:  No low back pain.  No joint pain.  No muscle tenderness. Extremities:  No pain or swelling. Skin:  No rashes or skin changes.  No bruising or bleeding. Neuro:  No headache, numbness or weakness, balance or coordination issues. Endocrine:  Diabetes.  No thyroid issues, hot flashes or night sweats. Psych:  No mood changes, depression or anxiety. Pain:  No pain. Review of systems:  All other systems reviewed and found to be negative.   Physical Exam: Blood pressure (!) 104/57, pulse 84, temperature (!) 96.8 F (36 C), temperature source Tympanic, resp. rate 18, weight 132 lb 5 oz (60 kg). GENERAL:  Thin frail elderly gentleman sitting comfortably in the exam room in no acute distress.  He has a cane at his side.  He needs assistance onto the exam table. MENTAL STATUS:  Alert and oriented to person, place and time. HEAD:  White hair.  Male pattern baldness with sun induced changes to scalp.  Normocephalic, atraumatic, face symmetric, no Cushingoid features. EYES:  Glasses.  Blue eyes.  Pupils equal round and reactive to light and accomodation.  No conjunctivitis or scleral icterus. ENT:  Oropharynx clear without lesion.  Tongue normal. Mucous membranes dry.  RESPIRATORY:  Clear to auscultation without rales, wheezes or rhonchi. CARDIOVASCULAR:  Regular rate and rhythm without murmur, rub or gallop. ABDOMEN:  Soft, non-tender, with active bowel sounds, and no hepatosplenomegaly.  No masses. SKIN:  Pale.  No rashes, ulcers or lesions. EXTREMITIES:  Trace ankle edema.  No skin discoloration or tenderness.  No palpable cords. LYMPH NODES: No palpable cervical, supraclavicular, axillary or inguinal adenopathy  NEUROLOGICAL: Unremarkable. PSYCH:  Appropriate.    Infusion on 07/30/2016  Component Date Value Ref Range Status  . WBC 07/30/2016 5.1  3.8 - 10.6 K/uL Final  . RBC 07/30/2016  1.99* 4.40 - 5.90 MIL/uL Final  . Hemoglobin 07/30/2016 5.4* 13.0 - 18.0 g/dL Final  . HCT 07/30/2016 16.9* 40.0 - 52.0 % Final  . MCV 07/30/2016 85.1  80.0 - 100.0 fL Final  . MCH 07/30/2016 26.9  26.0 - 34.0 pg Final  . MCHC 07/30/2016 31.7* 32.0 - 36.0 g/dL Final  . RDW 07/30/2016 18.8* 11.5 - 14.5 % Final  . Platelets 07/30/2016 150  150 - 440 K/uL Final  . Neutrophils Relative % 07/30/2016 77  % Final  . Neutro Abs 07/30/2016 3.9  1.4 - 6.5 K/uL Final  . Lymphocytes Relative 07/30/2016 9  % Final  . Lymphs Abs 07/30/2016 0.5* 1.0 - 3.6 K/uL Final  . Monocytes Relative 07/30/2016 12  % Final  . Monocytes Absolute 07/30/2016 0.6  0.2 - 1.0 K/uL Final  . Eosinophils Relative 07/30/2016 1  % Final  .  Eosinophils Absolute 07/30/2016 0.0  0 - 0.7 K/uL Final  . Basophils Relative 07/30/2016 1  % Final  . Basophils Absolute 07/30/2016 0.0  0 - 0.1 K/uL Final  . Sodium 07/30/2016 135  135 - 145 mmol/L Final  . Potassium 07/30/2016 4.2  3.5 - 5.1 mmol/L Final  . Chloride 07/30/2016 105  101 - 111 mmol/L Final  . CO2 07/30/2016 25  22 - 32 mmol/L Final  . Glucose, Bld 07/30/2016 203* 65 - 99 mg/dL Final  . BUN 07/30/2016 29* 6 - 20 mg/dL Final  . Creatinine, Ser 07/30/2016 1.55* 0.61 - 1.24 mg/dL Final  . Calcium 07/30/2016 8.4* 8.9 - 10.3 mg/dL Final  . Total Protein 07/30/2016 5.8* 6.5 - 8.1 g/dL Final  . Albumin 07/30/2016 3.3* 3.5 - 5.0 g/dL Final  . AST 07/30/2016 23  15 - 41 U/L Final  . ALT 07/30/2016 16* 17 - 63 U/L Final  . Alkaline Phosphatase 07/30/2016 103  38 - 126 U/L Final  . Total Bilirubin 07/30/2016 0.4  0.3 - 1.2 mg/dL Final  . GFR calc non Af Amer 07/30/2016 38* >60 mL/min Final  . GFR calc Af Amer 07/30/2016 44* >60 mL/min Final   Comment: (NOTE) The eGFR has been calculated using the CKD EPI equation. This calculation has not been validated in all clinical situations. eGFR's persistently <60 mL/min signify possible Chronic Kidney Disease.   . Anion gap  07/30/2016 5  5 - 15 Final  Clinical Support on 07/29/2016  Component Date Value Ref Range Status  . Specific Gravity, UA 07/29/2016 1.010  1.005 - 1.030 Final  . pH, UA 07/29/2016 6.0  5.0 - 7.5 Final  . Color, UA 07/29/2016 Yellow  Yellow Final  . Appearance Ur 07/29/2016 Clear  Clear Final  . Leukocytes, UA 07/29/2016 2+* Negative Final  . Protein, UA 07/29/2016 Trace* Negative/Trace Final  . Glucose, UA 07/29/2016 Negative  Negative Final  . Ketones, UA 07/29/2016 Negative  Negative Final  . RBC, UA 07/29/2016 Negative  Negative Final  . Bilirubin, UA 07/29/2016 Negative  Negative Final  . Urobilinogen, Ur 07/29/2016 0.2  0.2 - 1.0 mg/dL Final  . Nitrite, UA 07/29/2016 Negative  Negative Final  . Microscopic Examination 07/29/2016 See below:   Final  . WBC, UA 07/29/2016 11-30* 0 - 5 /hpf Final  . RBC, UA 07/29/2016 0-2  0 - 2 /hpf Final  . Epithelial Cells (non renal) 07/29/2016 None seen  0 - 10 /hpf Final  . Bacteria, UA 07/29/2016 Few* None seen/Few Final  . Yeast, UA 07/29/2016 Present* None seen Final   Assessment:  BODIN GORKA is an 83 y.o. male with a history of clinical stage II (T2NxM0) bladder cancer.  He underwent a TURBT on 08/14/2013.  Pathology revealed a 3 cm high grade base of bladder tumor which invaded the muscularis propria.  There was lymphovascular invasion.   He received 7 weekly cycles of carboplatin (10/03/2013 - 11/21/2013) with radiation (10/17/2013 - 12/08/2013).  Chest, abdomen, and pelvic CT scan on 02/23/2014 revealed no evidence of metastic disease.  He underwent cystoscopy in 04/2014 per his wife (unknown results).  He has a history of pancreatic cancer in the 1960s/1970s.  He underwent Whipple.  He has had no evidence of recurrent disease.  Chest, abdomen, and pelvic CT scan on 11/09/2014 revealed no evidence of recurrent disease. He has diverticulosis.  Cystoscopy on 10/07/2015 revealed multiple lesions within the bladder concerning for  carcinoma in situ with positive urine cytology.  CT urogram on 10/18/2015 revealed generalized bladder wall thickening and trabeculation without apparent focal lesion.  Cystoscopy on 11/05/2015 revealed at least a 2 cm area of patchy erythema involving the right lateral bladder wall beyond the right UO.  The suspicious area was fulgurated completely.  Pathology revealed invasive urothelial carcinoma invading the superficial lamina propria (stage T1).  No mucularis propria was present.  There was also urothelial carcinoma in situ.  He completed induction and maintenance BCG treatment.  He received BCG on 07/29/2016.   Liver MRI on 12/03/2015 revealed an area of focal fatty deposition.  There was stable postoperative appearance of the pancreas and upper GI tract compatible with previous Whipple procedure.  Follow-up imaging was recommended in 6 months.  Abdomen and pelvic CT on 07/27/2016 reveals stable appearance of the pancreas status post Whipple procedure. There was no evidence of metastatic disease. There was bladder wall thickening and trabeculation without focal lesion. There were lumbar compression deformities. The L2 compression fracture was new.  He was on oral iron TID for some time likely secondary to initial hematuria.  Iron was discontinued on 04/27/2014.  Diet is good.  His last colonoscopy was 4 years ago.  He denies any melena or hematochezia.  Iron stores are good.  He has chronic kidney disease (creatinine clearance 35 ml/min).  He has dementia.   He has a history of mild thrombocytopenia since 07/2013 of unclear etiology.  Platelet count has ranged from 65,000 - 134,000 without trend.  He is on no new medications or herbal products.  He denies any bruising or bleeding.  Symptomatically, he feels "ok".  He denies any melena, hematochezia, or hematuria.  Hematocrit is 16.9 with a hemoglobin of 5.4.  Platelet count is 150,000.    Plan: 1.  Labs today:  CBC with diff, CMP. 2.  Review  interval CT scans- no evidence of metastatic disease. 3.  Discuss significant anemia today.  He denies any on or hematochezia. He denies any hematuria.  Discuss additional labs. 4.  Additional labs: repeat CBC, type and screen, retake, ferritin, iron studies, [place]. 5.  As anemia confirmed, discuss admission to the hospital for transfusion and likely GI evaluation.   Lequita Asal, MD  07/30/2016, 3:18 PM

## 2016-07-30 NOTE — H&P (Signed)
[place] at [place] NAME: Colton Tyler    MR#:  [id number]  DATE OF BIRTH:  10/16/1925  DATE OF ADMISSION:  07/30/2016  PRIMARY CARE PHYSICIAN: Kirk Ruths, MD   REQUESTING/REFERRING PHYSICIAN: Dr. Geralyn Flash  CHIEF COMPLAINT:  No chief complaint on file.  Weakness, Anemia  HISTORY OF PRESENT ILLNESS:  Colton Tyler  is a 83 y.o. male with a known history of pancreatic cancer s/p whipples procedure, bladder cancer, HTN, DM here with anemia and weakness from oncology office. He saw Dr. Derwood Kaplan earlier today. Labs show Hb 5.4, iron deficiency. Normal MCV. No hematuria on UA. He complains of weakness, dizziness. Has been using a cane for 2 weeks. No abdominal pain or vomiting or melena or blood in stool. Takes a baby aspirin daily.  PAST MEDICAL HISTORY:   Past Medical History:  Diagnosis Date  . Bladder cancer ([place])   . Chronic kidney disease   . Coronary artery disease   . Dementia   . Diabetes ([place])   . Dysrhythmia   . GERD (gastroesophageal reflux disease)   . Hematuria, gross 07/23/2014  . Hyperlipidemia   . Hypertension   . Pancreatic cancer [facility])    s/p whipple procedure    PAST SURGICAL HISTORY:   Past Surgical History:  Procedure Laterality Date  . BLADDER SURGERY    . CYSTOSCOPY WITH BIOPSY N/A 11/05/2015   Procedure: CYSTOSCOPY WITH BIOPSY;  Surgeon: [name], MD;  Location: [facility] ORS;  Service: Urology;  Laterality: N/A;  . CYSTOSCOPY WITH FULGERATION  11/05/2015   Procedure: CYSTOSCOPY WITH FULGERATION;  Surgeon: [name], MD;  Location: [facility] ORS;  Service: Urology;;  . HERNIA REPAIR Left    Inguinal Hernia Repair  . PORTACATH PLACEMENT Right 2015   [facility]  . WHIPPLE PROCEDURE    . WHIPPLE PROCEDURE      SOCIAL HISTORY:   Social History  Substance Use Topics  . Smoking status: Never Smoker  . Smokeless tobacco: Never Used  . Alcohol use No    FAMILY HISTORY:   Family History   Problem Relation Age of Onset  . Cerebrovascular Accident Father   . Stomach cancer Sister   . Kidney disease Neg Hx   . Prostate cancer Neg Hx   . Kidney cancer Neg Hx   . Bladder Cancer Neg Hx     DRUG ALLERGIES:   Allergies  Allergen Reactions  . Penicillins Other (See Comments)    "unknown"    REVIEW OF SYSTEMS:   Review of Systems  Constitutional: Positive for malaise/fatigue. Negative for chills and fever.  HENT: Negative for sore throat.   Eyes: Negative for blurred vision, double vision and pain.  Respiratory: Negative for cough, hemoptysis, shortness of breath and wheezing.   Cardiovascular: Negative for chest pain, palpitations, orthopnea and leg swelling.  Gastrointestinal: Negative for abdominal pain, constipation, diarrhea, heartburn, nausea and vomiting.  Genitourinary: Negative for dysuria and hematuria.  Musculoskeletal: Negative for back pain and joint pain.  Skin: Negative for rash.  Neurological: Positive for dizziness and weakness. Negative for sensory change, speech change, focal weakness and headaches.  Endo/Heme/Allergies: Does not bruise/bleed easily.  Psychiatric/Behavioral: Negative for depression. The patient is not nervous/anxious.     MEDICATIONS AT HOME:   Prior to Admission medications   Medication Sig Start Date End Date Taking? Authorizing Provider  amLODipine (NORVASC) 10 MG tablet Take 10 mg by mouth daily.  02/12/15   [provider]  aspirin  81 MG tablet Take 81 mg by mouth daily.     [provider]  Insulin Glargine (LANTUS SOLOSTAR) 100 UNIT/ML Solostar Pen Inject 20 Units into the skin.  12/24/14   [provider]  memantine (NAMENDA) 10 MG tablet Take 10 mg by mouth 2 (two) times daily.  02/12/15   [provider]  simvastatin (ZOCOR) 20 MG tablet Take 20 mg by mouth daily at 12 noon.  02/12/15   [provider]  sitaGLIPtin (JANUVIA) 25 MG tablet Take 25 mg by mouth daily at 12 noon.      [provider]     VITAL SIGNS:  Blood pressure (!) 171/45, pulse 83, temperature 97.9 F (36.6 C), temperature source Oral, resp. rate 16, height 5\' 7"  (1.702 m), weight 60.1 kg (132 lb 8 oz), SpO2 100 %.  PHYSICAL EXAMINATION:  Physical Exam  GENERAL:  83 y.o.-year-old patient lying in the bed with no acute distress.  EYES: Pupils equal, round, reactive to light and accommodation. No scleral icterus. Extraocular muscles intact.  HEENT: Head atraumatic, normocephalic. Oropharynx and nasopharynx clear. No oropharyngeal erythema, moist oral mucosa  NECK:  Supple, no jugular venous distention. No thyroid enlargement, no tenderness.  LUNGS: Normal breath sounds bilaterally, no wheezing, rales, rhonchi. No use of accessory muscles of respiration.  CARDIOVASCULAR: S1, S2 normal. No murmurs, rubs, or gallops.  ABDOMEN: Soft, nontender, nondistended. Bowel sounds present. No organomegaly or mass.  EXTREMITIES: No pedal edema, cyanosis, or clubbing. + 2 pedal & radial pulses b/l.   NEUROLOGIC: Cranial nerves II through XII are intact. No focal Motor or sensory deficits appreciated b/l PSYCHIATRIC: The patient is alert and oriented x 3. Good affect.  SKIN: No obvious rash, lesion, or ulcer.   LABORATORY PANEL:   CBC  Recent Labs Lab 07/30/16 1531  WBC 5.4  HGB 5.3*  HCT 16.9*  PLT 155   ------------------------------------------------------------------------------------------------------------------  Chemistries   Recent Labs Lab 07/30/16 1432  NA 135  K 4.2  CL 105  CO2 25  GLUCOSE 203*  BUN 29*  CREATININE 1.55*  CALCIUM 8.4*  AST 23  ALT 16*  ALKPHOS 103  BILITOT 0.4   ------------------------------------------------------------------------------------------------------------------  Cardiac Enzymes No results for input(s): TROPONINI in the last 168  hours. ------------------------------------------------------------------------------------------------------------------  RADIOLOGY:  No results found.   IMPRESSION AND PLAN:   * Iron deficiency anemia No source of blood loss found. Hb 12.6 - 01/2017. Check stool for hemoccult Transfuse 2 units PRBC Consult GI. Discussed with Dr. Allen Norris. Repeat hb after transfusion.  * IDDM SSI Reduce levemir dose to 8 units Q HS  * HTN Not on amlodipine anymore MOnitor  * Bladder cancer Follows with urology No hematuria on UA yesterday  * DVT prophylaxis SCDs   All the records are reviewed and case discussed with ED provider. Management plans discussed with the patient, family and they are in agreement.  CODE STATUS: DNR  TOTAL TIME TAKING CARE OF THIS PATIENT: 40 minutes.   Hillary Bow R M.D on 07/30/2016 at 5:42 PM  Between 7am to 6pm - Pager - [phone]  After 6pm go to [email or web address] - password [email or web address] [facility]  Office  [phone]  CC: Primary care physician; Kirk Ruths, MD  Note: This dictation was prepared with Dragon dictation along with smaller phrase technology. Any transcriptional errors that result from this process are unintentional.

## 2016-07-31 ENCOUNTER — Encounter
Admission: AD | Disposition: A | Discharge status: Home / Self Care | Payer: Self-pay | Source: Ambulatory Visit | Attending: Internal Medicine

## 2016-07-31 ENCOUNTER — Inpatient Hospital Stay: Payer: Medicare Other | Admitting: Anesthesiology

## 2016-07-31 DIAGNOSIS — D120 Benign neoplasm of cecum: Secondary | ICD-10-CM

## 2016-07-31 DIAGNOSIS — D649 Anemia, unspecified: Secondary | ICD-10-CM

## 2016-07-31 DIAGNOSIS — K31811 Angiodysplasia of stomach and duodenum with bleeding: Secondary | ICD-10-CM

## 2016-07-31 HISTORY — PX: COLONOSCOPY WITH PROPOFOL: SHX5780

## 2016-07-31 HISTORY — PX: ESOPHAGOGASTRODUODENOSCOPY (EGD) WITH PROPOFOL: SHX5813

## 2016-07-31 LAB — CBC
HEMATOCRIT: 25 % — AB (ref 40.0–52.0)
HEMOGLOBIN: 8.1 g/dL — AB (ref 13.0–18.0)
MCH: 27.9 pg (ref 26.0–34.0)
MCHC: 32.5 g/dL (ref 32.0–36.0)
MCV: 86 fL (ref 80.0–100.0)
Platelets: 131 10*3/uL — ABNORMAL LOW (ref 150–440)
RBC: 2.91 MIL/uL — AB (ref 4.40–5.90)
RDW: 17.3 % — ABNORMAL HIGH (ref 11.5–14.5)
WBC: 5.2 10*3/uL (ref 3.8–10.6)

## 2016-07-31 LAB — GLUCOSE, CAPILLARY
GLUCOSE-CAPILLARY: 94 mg/dL (ref 65–99)
GLUCOSE-CAPILLARY: 37 mg/dL — AB (ref 65–99)
GLUCOSE-CAPILLARY: 50 mg/dL — AB (ref 65–99)
GLUCOSE-CAPILLARY: 98 mg/dL (ref 65–99)
Glucose-Capillary: 50 mg/dL — ABNORMAL LOW (ref 65–99)
Glucose-Capillary: 84 mg/dL (ref 65–99)
Glucose-Capillary: 168 mg/dL — ABNORMAL HIGH (ref 65–99)

## 2016-07-31 LAB — BASIC METABOLIC PANEL
ANION GAP: 5 (ref 5–15)
BUN: 26 mg/dL — ABNORMAL HIGH (ref 6–20)
CALCIUM: 8 mg/dL — AB (ref 8.9–10.3)
CO2: 25 mmol/L (ref 22–32)
Chloride: 106 mmol/L (ref 101–111)
Creatinine, Ser: 1.46 mg/dL — ABNORMAL HIGH (ref 0.61–1.24)
GFR calc Af Amer: 48 mL/min — ABNORMAL LOW (ref >60)
GFR, EST NON AFRICAN AMERICAN: 41 mL/min — AB (ref >60)
Glucose, Bld: 135 mg/dL — ABNORMAL HIGH (ref 65–99)
POTASSIUM: 4.4 mmol/L (ref 3.5–5.1)
Sodium: 136 mmol/L (ref 135–145)

## 2016-07-31 LAB — HEMOGLOBIN A1C
HEMOGLOBIN A1C: 7.2 % — AB (ref 4.8–5.6)
MEAN PLASMA GLUCOSE: 160 mg/dL

## 2016-07-31 SURGERY — ESOPHAGOGASTRODUODENOSCOPY (EGD) WITH PROPOFOL
Anesthesia: General

## 2016-07-31 MED ORDER — FENTANYL CITRATE (PF) 100 MCG/2ML IJ SOLN
INTRAMUSCULAR | Status: DC | PRN
  Administered 2016-07-31: 25 ug via INTRAVENOUS

## 2016-07-31 MED ORDER — LIDOCAINE HCL (CARDIAC) 20 MG/ML IV SOLN
INTRAVENOUS | Status: DC | PRN
  Administered 2016-07-31: 60 mg via INTRAVENOUS

## 2016-07-31 MED ORDER — GLYCOPYRROLATE 0.2 MG/ML IJ SOLN
INTRAMUSCULAR | Status: DC | PRN
  Administered 2016-07-31: .2 mg via INTRAVENOUS

## 2016-07-31 MED ORDER — PROPOFOL 500 MG/50ML IV EMUL
INTRAVENOUS | Status: AC
  Filled 2016-07-31: qty 50

## 2016-07-31 MED ORDER — PHENYLEPHRINE HCL 10 MG/ML IJ SOLN
INTRAMUSCULAR | Status: DC | PRN
  Administered 2016-07-31: 50 ug via INTRAVENOUS

## 2016-07-31 MED ORDER — SODIUM CHLORIDE 0.9 % IV SOLN
INTRAVENOUS | Status: DC
  Administered 2016-07-31: 1000 mL via INTRAVENOUS

## 2016-07-31 MED ORDER — SODIUM CHLORIDE 0.9 % IJ SOLN
INTRAMUSCULAR | Status: DC | PRN
  Administered 2016-07-31: 5 mL

## 2016-07-31 MED ORDER — GLYCOPYRROLATE 0.2 MG/ML IJ SOLN
INTRAMUSCULAR | Status: AC
  Filled 2016-07-31: qty 1

## 2016-07-31 MED ORDER — PROPOFOL 10 MG/ML IV BOLUS: INTRAVENOUS | Status: DC | PRN

## 2016-07-31 MED ORDER — PROPOFOL 10 MG/ML IV BOLUS
INTRAVENOUS | Status: DC | PRN
  Administered 2016-07-31: 70 mg via INTRAVENOUS

## 2016-07-31 MED ORDER — PROPOFOL 500 MG/50ML IV EMUL
INTRAVENOUS | Status: DC | PRN
  Administered 2016-07-31: 150 ug/kg/min via INTRAVENOUS

## 2016-07-31 MED ORDER — FENTANYL CITRATE (PF) 100 MCG/2ML IJ SOLN
INTRAMUSCULAR | Status: AC
  Filled 2016-07-31: qty 2

## 2016-07-31 NOTE — Plan of Care (Signed)
Problem: Education: Goal: Knowledge of [facility] Education information/materials will improve Outcome: Progressing Bradycardic when sleeping, VSS otherwise.  Free of falls during shift.  Denies pain.  Received 2u PRBC, tolerated well, no s/s of reaction.  Finished approximately 75% of bowel prep.  RN provided frequent reinforcement/reminders of reason for hospitalization, upcoming procedures.  Bed in low position, call bell within reach.  WCTM.

## 2016-07-31 NOTE — Op Note (Signed)
[facility] Gastroenterology Patient Name: Colton Tyler Procedure Date: 07/31/2016 1:23 PM MRN: [id number] Account #: [id number] Date of Birth: Dec 08, 1925 Admit Type: Inpatient Age: 83 Room: [facility] ENDO ROOM 4 Gender: Male Note Status: Finalized Procedure:            Upper GI endoscopy Indications:          Iron deficiency anemia Providers:            [name] MD, MD Referring MD:         Ocie Cornfield. Ouida Sills MD, MD (Referring MD) Medicines:            Propofol per Anesthesia Complications:        No immediate complications. Procedure:            Pre-Anesthesia Assessment:                       - Prior to the procedure, a History and Physical was                        performed, and patient medications and allergies were                        reviewed. The patient's tolerance of previous                        anesthesia was also reviewed. The risks and benefits of                        the procedure and the sedation options and risks were                        discussed with the patient. All questions were                        answered, and informed consent was obtained. Prior                        Anticoagulants: The patient has taken no previous                        anticoagulant or antiplatelet agents. ASA Grade                        Assessment: II - A patient with mild systemic disease.                        After reviewing the risks and benefits, the patient was                        deemed in satisfactory condition to undergo the                        procedure.                       After obtaining informed consent, the endoscope was                        passed under direct vision. Throughout the procedure,  the patient's blood pressure, pulse, and oxygen                        saturations were monitored continuously. The Endoscope                        was introduced through the mouth, and advanced to the              jejunum. The upper GI endoscopy was accomplished                        without difficulty. The patient tolerated the procedure                        well. Findings:      The examined esophagus was normal.      Evidence of a Roux-en-Y anastomosis was found in the stomach. This was       characterized by an intact staple line. Removal of a staple was       accomplished with a regular forceps.      Multiple stigmata of recent bleeding angioectasias were found in the       stomach. Coagulation for hemostasis using argon beam at 2 liters/minute       and 30 watts was successful.      The examined jejunum was normal. Impression:           - Normal esophagus.                       - A Roux-en-Y anastomosis was found, characterized by                        an intact staple line.                       - Multiple recently bleeding angioectasias in the                        stomach. Treated with argon beam coagulation.                       - Normal examined jejunum. Recommendation:       - Discharge patient to home.                       - Perform a colonoscopy today. Procedure Code(s):    --- Professional ---                       [id number], 19, Esophagogastroduodenoscopy, flexible,                        transoral; with control of bleeding, any method                       [id number], Esophagogastroduodenoscopy, flexible, transoral;                        with removal of foreign body(s) Diagnosis Code(s):    --- Professional ---                       D50.9, Iron deficiency anemia, unspecified  K31.811, Angiodysplasia of stomach and duodenum with                        bleeding CPT copyright 2016 American Medical Association. All rights reserved. The codes documented in this report are preliminary and upon coder review may  be revised to meet current compliance requirements. [name] MD, MD 07/31/2016 1:49:37 PM This report has been signed electronically. Number  of Addenda: 0 Note Initiated On: 07/31/2016 1:23 PM      [facility]

## 2016-07-31 NOTE — Plan of Care (Signed)
Problem: Education: Goal: Knowledge of [facility] Education information/materials will improve Outcome: Progressing EGD and Colonoscopy completed. Patient diet resumed. Episode of Hypoglycemia (pt asymptomatic) post procedure; Refer to note. MD made aware and protocol followed. No further bleeding noted this shift. Daughter visiting with patient this afternoon. Labs to be rechecked in the morning

## 2016-07-31 NOTE — Anesthesia Preprocedure Evaluation (Signed)
Anesthesia Evaluation  Patient identified by MRN, date of birth, ID band Patient awake    Reviewed: Allergy & Precautions, H&P , NPO status , Patient's Chart, lab work & pertinent test results, reviewed documented beta blocker date and time   Airway Mallampati: II   Neck ROM: full    Dental  (+) Poor Dentition   Pulmonary neg pulmonary ROS,    Pulmonary exam normal        Cardiovascular hypertension, + CAD and + Peripheral Vascular Disease  negative cardio ROS Normal cardiovascular exam+ dysrhythmias  Rhythm:regular Rate:Normal     Neuro/Psych PSYCHIATRIC DISORDERS negative neurological ROS  negative psych ROS   GI/Hepatic negative GI ROS, Neg liver ROS, GERD  Medicated,  Endo/Other  negative endocrine ROSdiabetes  Renal/GU CRFRenal diseasenegative Renal ROS  negative genitourinary   Musculoskeletal   Abdominal   Peds  Hematology negative hematology ROS (+) anemia ,   Anesthesia Other Findings Past Medical History: No date: Bladder cancer ([facility]) No date: Chronic kidney disease No date: Coronary artery disease No date: Dementia No date: Diabetes ([place]) No date: Dysrhythmia No date: GERD (gastroesophageal reflux disease) 07/23/2014: Hematuria, gross No date: Hyperlipidemia No date: Hypertension No date: Pancreatic cancer ([place])     Comment: s/p whipple procedure Past Surgical History: No date: BLADDER SURGERY 11/05/2015: CYSTOSCOPY WITH BIOPSY N/A     Comment: Procedure: CYSTOSCOPY WITH BIOPSY;  Surgeon:               [name], MD;  Location: [facility] ORS;                Service: Urology;  Laterality: N/A; 11/05/2015: CYSTOSCOPY WITH FULGERATION     Comment: Procedure: CYSTOSCOPY WITH FULGERATION;                Surgeon: [name], MD;  Location: [facility]               ORS;  Service: Urology;; No date: HERNIA REPAIR Left     Comment: Inguinal Hernia Repair 2015: PORTACATH PLACEMENT Right     Comment:  [facility] No date: WHIPPLE PROCEDURE No date: WHIPPLE PROCEDURE BMI    Body Mass Index:  20.75 kg/m     Reproductive/Obstetrics negative OB ROS                             Anesthesia Physical Anesthesia Plan  ASA: III  Anesthesia Plan: General   Post-op Pain Management:    Induction:   Airway Management Planned:   Additional Equipment:   Intra-op Plan:   Post-operative Plan:   Informed Consent: I have reviewed the patients History and Physical, chart, labs and discussed the procedure including the risks, benefits and alternatives for the proposed anesthesia with the patient or authorized representative who has indicated his/her understanding and acceptance.   Dental Advisory Given  Plan Discussed with: CRNA  Anesthesia Plan Comments:         Anesthesia Quick Evaluation

## 2016-07-31 NOTE — Progress Notes (Signed)
Inpatient Diabetes Program Recommendations  AACE/ADA: New Consensus Statement on Inpatient Glycemic Control (2015)  Target Ranges:  Prepandial:   less than 140 mg/dL      Peak postprandial:   less than 180 mg/dL (1-2 hours)      Critically ill patients:  140 - 180 mg/dL   Lab Results  Component Value Date   GLUCAP 50 (L) 07/31/2016   HGBA1C 7.2 (H) 07/30/2016    Review of Glycemic Control  Diabetes history: Type 2 Outpatient Diabetes medications: Lantus 20 units qhs, Januvia 25mg  qday Current orders for Inpatient glycemic control: Novolog 0-9 units tid  Inpatient Diabetes Program Recommendations:  Agree with current orders for blood sugar management.  RN called for guidance with recent low blood sugar.  RN treated low blood sugar with juice x 2 and peanut butter crackers.  Recommend call MD to notify re:low blood sugar.  Patient asymptomatic.  RN has called for the patient meal tray.   F/U CBG 84mg /dl.    Gentry Fitz, RN, BA, MHA, CDE Diabetes Coordinator Inpatient Diabetes Program  [phone] (Team Pager) [phone] ([place]) 07/31/2016 5:25 PM

## 2016-07-31 NOTE — Anesthesia Post-op Follow-up Note (Cosign Needed)
Anesthesia QCDR form completed.        

## 2016-07-31 NOTE — Progress Notes (Signed)
Hypoglycemic Event  CBG:  37 @ 1552  Recheck of 50 @ 1616 Recheck of  50@ 1636  Treatment: 8 oz of Apple juice times two; additional 4oz of apple juice, graham crackers and peanut butter  Symptoms: None  Follow-up CBG: Time:1616 , 1636 and 1702 CBG Result: 84  Possible Reasons for Event: Inadequate meal intake: Patient was NPO and had just returned from EGD/ Colonoscopy  [employer] called and asked to rush patient's meal tray  Comments/MD notified: Dr. Bridgett Larsson, hospitalist [phone] and 1644- MD aware of all patient' blood sugars. States to give food.   Diabetes Coordinator, Almyra Free also paged and notified of event.  Patient 's meal tray was finally delivered and patient is currently eating.     [name]

## 2016-07-31 NOTE — Progress Notes (Signed)
[place] at [place] NAME: Colton Tyler    MR#:  [id number]  DATE OF BIRTH:  1925/04/29  SUBJECTIVE:  CHIEF COMPLAINT:  No chief complaint on file.  No melena or bloody stool. Generalized weakness. REVIEW OF SYSTEMS:  Review of Systems  Constitutional: Positive for malaise/fatigue. Negative for chills and fever.  HENT: Negative for congestion.   Eyes: Negative for blurred vision and double vision.  Respiratory: Negative for cough, shortness of breath and stridor.   Cardiovascular: Negative for chest pain, palpitations and leg swelling.  Gastrointestinal: Negative for abdominal pain, blood in stool, diarrhea, melena and vomiting.  Genitourinary: Negative for dysuria and hematuria.  Musculoskeletal: Negative for back pain.  Skin: Negative for itching and rash.  Neurological: Positive for weakness. Negative for dizziness, focal weakness and loss of consciousness.  Psychiatric/Behavioral: Negative for depression. The patient is not nervous/anxious.     DRUG ALLERGIES:   Allergies  Allergen Reactions  . Penicillins Other (See Comments)    "unknown"   VITALS:  Blood pressure (!) 138/53, pulse 66, temperature 97.5 F (36.4 C), temperature source Oral, resp. rate 14, height 5\' 7"  (1.702 m), weight 132 lb 8 oz (60.1 kg), SpO2 96 %. PHYSICAL EXAMINATION:  Physical Exam  Constitutional: He is oriented to person, place, and time and well-developed, well-nourished, and in no distress.  HENT:  Mouth/Throat: Oropharynx is clear and moist.  Eyes: Conjunctivae and EOM are normal.  Neck: Normal range of motion. Neck supple. No JVD present. No tracheal deviation present.  Cardiovascular: Normal rate and regular rhythm.  Exam reveals no gallop.   No murmur heard. Pulmonary/Chest: Effort normal and breath sounds normal. No respiratory distress. He has no wheezes. He has no rales.  Abdominal: Soft. Bowel sounds are normal. He exhibits no distension.  There is no tenderness.  Musculoskeletal: Normal range of motion. He exhibits no edema or tenderness.  Neurological: He is oriented to person, place, and time. No cranial nerve deficit.  Skin: No rash noted. No erythema.  Psychiatric: Affect normal.   LABORATORY PANEL:  Male CBC  Recent Labs Lab 07/31/16 0500  WBC 5.2  HGB 8.1*  HCT 25.0*  PLT 131*   ------------------------------------------------------------------------------------------------------------------ Chemistries   Recent Labs Lab 07/30/16 1432 07/31/16 0500  NA 135 136  K 4.2 4.4  CL 105 106  CO2 25 25  GLUCOSE 203* 135*  BUN 29* 26*  CREATININE 1.55* 1.46*  CALCIUM 8.4* 8.0*  AST 23  --   ALT 16*  --   ALKPHOS 103  --   BILITOT 0.4  --    RADIOLOGY:  No results found. ASSESSMENT AND PLAN:  * Iron deficiency anemia, due to blood loss from GIB. Hb 12.6 - 01/2017. Hb up to 8.1 from 5.3 after 2 units PRBC transfusion.  GIB.  EGD: Multiple recently bleeding angioectasias in the stomach. Diverticulosis in the sigmoid colon. One 9 mm polyp in the cecum, removed with a hot snare per  Dr. Allen Norris.  * IDDM SSI Hold lantus due to hypoglycemia. BS down to 37.   * HTN, BP is normal. Not on amlodipine anymore  * Bladder cancer Follows with urology No hematuria on UA.  CKD stage 3, stable.  Generalized weakness. PT evaluation.  All the records are reviewed and case discussed with Care Management/Social Worker. Management plans discussed with the patient, granddaughter and they are in agreement.  CODE STATUS: DNR  TOTAL TIME TAKING CARE OF THIS PATIENT: 75  minutes.   More than 50% of the time was spent in counseling/coordination of care: YES  POSSIBLE D/C IN 1 DAYS, DEPENDING ON CLINICAL CONDITION.   Demetrios Loll M.D on 07/31/2016 at 4:02 PM  Between 7am to 6pm - Pager - [phone]  After 6pm go to [email or web address] - Technical brewer [facility]  Office   [phone]  CC: Primary care physician; Kirk Ruths, MD  Note: This dictation was prepared with Dragon dictation along with smaller phrase technology. Any transcriptional errors that result from this process are unintentional.p

## 2016-07-31 NOTE — Op Note (Signed)
[facility] Patient Name: Colton Tyler Procedure Date: 07/31/2016 1:22 PM MRN: [id number] Account #: [id number] Date of Birth: 08-06-1925 Admit Type: Inpatient Age: 83 Room: [facility] ENDO ROOM 4 Gender: Male Note Status: Finalized Procedure:            Colonoscopy Indications:          Iron deficiency anemia Providers:            [name] MD, MD Referring MD:         Ocie Cornfield. Ouida Sills MD, MD (Referring MD) Medicines:            Propofol per Anesthesia Complications:        No immediate complications. Procedure:            Pre-Anesthesia Assessment:                       - Prior to the procedure, a History and Physical was                        performed, and patient medications and allergies were                        reviewed. The patient's tolerance of previous                        anesthesia was also reviewed. The risks and benefits of                        the procedure and the sedation options and risks were                        discussed with the patient. All questions were                        answered, and informed consent was obtained. Prior                        Anticoagulants: The patient has taken no previous                        anticoagulant or antiplatelet agents. ASA Grade                        Assessment: II - A patient with mild systemic disease.                        After reviewing the risks and benefits, the patient was                        deemed in satisfactory condition to undergo the                        procedure.                       After obtaining informed consent, the colonoscope was                        passed under direct vision. Throughout the procedure,  the patient's blood pressure, pulse, and oxygen                        saturations were monitored continuously. The                        Colonoscope was introduced through the anus and                        advanced  to the the cecum, identified by appendiceal                        orifice and ileocecal valve. The colonoscopy was                        performed without difficulty. The patient tolerated the                        procedure well. The quality of the bowel preparation                        was unsatisfactory. Findings:      The perianal and digital rectal examinations were normal.      A 9 mm polyp was found in the cecum. The polyp was sessile. The polyp       was removed with a hot snare. Resection and retrieval were complete. To       prevent bleeding post-intervention, two hemostatic clips were       successfully placed (MR conditional). There was no bleeding at the end       of the procedure.      Multiple small-mouthed diverticula were found in the sigmoid colon. Impression:           - Preparation of the colon was unsatisfactory.                       - One 9 mm polyp in the cecum, removed with a hot                        snare. Resected and retrieved. Clips (MR conditional)                        were placed.                       - Diverticulosis in the sigmoid colon. Recommendation:       - Resume previous diet.                       - Return patient to hospital ward for observation. Procedure Code(s):    --- Professional ---                       [phone], Colonoscopy, flexible; with removal of tumor(s),                        polyp(s), or other lesion(s) by snare technique Diagnosis Code(s):    --- Professional ---                       D50.9, Iron deficiency anemia, unspecified  D12.0, Benign neoplasm of cecum CPT copyright 2016 American Medical Association. All rights reserved. The codes documented in this report are preliminary and upon coder review may  be revised to meet current compliance requirements. [name] MD, MD 07/31/2016 2:07:46 PM This report has been signed electronically. Number of Addenda: 0 Note Initiated On: 07/31/2016 1:22  PM Scope Withdrawal Time: 0 hours 9 minutes 28 seconds  Total Procedure Duration: 0 hours 14 minutes 14 seconds       [facility]

## 2016-07-31 NOTE — Progress Notes (Signed)
PT Cancellation  Consult received and chart reviewed. Patient currently off unit for endo-procedure.  Will re-attempt evaluation at later time/date as medically appropriate and available.  Veasna Santibanez H. Owens Shark, PT, DPT, NCS 07/31/16, 12:58 PM [phone]

## 2016-07-31 NOTE — Transfer of Care (Signed)
Immediate Anesthesia Transfer of Care Note  Patient: Colton Tyler  Procedure(s) Performed: Procedure(s): ESOPHAGOGASTRODUODENOSCOPY (EGD) WITH PROPOFOL (N/A) COLONOSCOPY WITH PROPOFOL (N/A)  Patient Location: PACU  Anesthesia Type:General  Level of Consciousness: sedated  Airway & Oxygen Therapy: Patient Spontanous Breathing and Patient connected to face mask oxygen  Post-op Assessment: Report given to RN and Post -op Vital signs reviewed and stable  Post vital signs: Reviewed and stable  Last Vitals:  Vitals:   07/31/16 1410 07/31/16 1411  BP: (!) 88/55 (!) 88/55  Pulse: 60 (!) 54  Resp: 13 12  Temp: (!) 35.9 C (!) 35.9 C    Last Pain:  Vitals:   07/31/16 1411  TempSrc: Axillary  PainSc:          Complications: No apparent anesthesia complications

## 2016-08-01 LAB — GLUCOSE, CAPILLARY
GLUCOSE-CAPILLARY: 45 mg/dL — AB (ref 65–99)
GLUCOSE-CAPILLARY: 48 mg/dL — AB (ref 65–99)
Glucose-Capillary: 51 mg/dL — ABNORMAL LOW (ref 65–99)
Glucose-Capillary: 69 mg/dL (ref 65–99)
Glucose-Capillary: 75 mg/dL (ref 65–99)
Glucose-Capillary: 211 mg/dL — ABNORMAL HIGH (ref 65–99)

## 2016-08-01 LAB — TYPE AND SCREEN
ABO/RH(D): O POS
Antibody Screen: NEGATIVE
Unit division: 0
Unit division: 0

## 2016-08-01 LAB — HEMOGLOBIN: HEMOGLOBIN: 8.2 g/dL — AB (ref 13.0–18.0)

## 2016-08-01 LAB — BPAM RBC
Blood Product Expiration Date: 201806152359
Blood Product Expiration Date: 201806152359
ISSUE DATE / TIME: 201805242214
ISSUE DATE / TIME: 201805250153
Unit Type and Rh: 5100
Unit Type and Rh: 5100

## 2016-08-01 MED ORDER — HEPARIN SOD (PORK) LOCK FLUSH 100 UNIT/ML IV SOLN
500.0000 [IU] | Freq: Once | INTRAVENOUS | Status: AC
  Administered 2016-08-01: 500 [IU] via INTRAVENOUS
  Filled 2016-08-01: qty 5

## 2016-08-01 MED ORDER — AMLODIPINE BESYLATE 10 MG PO TABS: 10.0000 mg | ORAL_TABLET | Freq: Every day | ORAL | 0 refills | Status: DC

## 2016-08-01 MED ORDER — AMLODIPINE BESYLATE 10 MG PO TABS: 10.0000 mg | ORAL_TABLET | Freq: Every day | ORAL | Status: DC

## 2016-08-01 NOTE — Progress Notes (Signed)
Spoke with Dr. Bridgett Larsson regarding current vitals and CBG.  and the patient's Irregular heart Rate. Patient  Asymptomatic, denies any chest pain and is currently eating his breakfast. Order received for EKG at this time.

## 2016-08-01 NOTE — Progress Notes (Signed)
Diabetes Coordinator paged and consulted on discharge plan prior to patient leaving campus.   Discharge Instructions thoroughly reviewed with patient and daughter, who is HCPOA. Daughter verbalized understanding as patient has memory impairment. Daughter aware of patient vital signs; states she will follow up on blood pressure at home as patient has had some recent changes to medication. Extensive education provided to daughter on hypoglycemia and the need to monitor patient's blood sugars more frequently for the next couple of days due to recent episodes of hypoglycemia in the hospital. Patient reminded to call physician who prescribes insulin or PCP as most places have someone on-call over the [place] Weekend if needed; if medical emergency arises then to call 911. Specific education provided and clarity given as to not given insulin with low blood sugar readings; information on hypoglycemia given with discharge paperwork. Daughter verbalized understanding and was able to [facility] to nurse. Patient left the unit with belongings, escorted to car via wheeelchair by NT. Patient's daughter to drive him home.

## 2016-08-01 NOTE — Progress Notes (Addendum)
Called  At 3:30pm and spoke with daughter Colton Tyler.) regarding Blood pressure prescription found at hospital after patient had left. Daughter stated that patient had been taken off this medication by his cardiologist so the prescription was not needed at this time and if the patient did need the medication he already had some of the same medication and dosage at home. Daughter states that she will follow up on patient's pressure with primary care.

## 2016-08-01 NOTE — Discharge Summary (Addendum)
[place] [facility] [place] NAME: Colton Tyler    MR#:  [id number]  DATE OF BIRTH:  Apr 29, 1941  DATE OF ADMISSION:  07/30/2016   ADMITTING PHYSICIAN: Vaughan Basta, MD  DATE OF DISCHARGE: 08/01/2016 PRIMARY CARE PHYSICIAN: Kirk Ruths, MD   ADMISSION DIAGNOSIS:  Anemia r Cancer Anemia DISCHARGE DIAGNOSIS:  Active Problems:   Anemia   Benign neoplasm of cecum   Angiodysplasia of stomach and duodenum with hemorrhage  SECONDARY DIAGNOSIS:   Past Medical History:  Diagnosis Date  . Bladder cancer ([place])   . Chronic kidney disease   . Coronary artery disease   . Dementia   . Diabetes ([place])   . Dysrhythmia   . GERD (gastroesophageal reflux disease)   . Hematuria, gross 07/23/2014  . Hyperlipidemia   . Hypertension   . Pancreatic cancer [facility])    s/p whipple procedure   HOSPITAL COURSE:  * Iron deficiency anemia, due to blood loss from GIB. Hb 12.6 - 01/2017. Hb up to 8.1 from 5.3 after 2 units PRBC transfusion. Repeat Hb is 8.2 today. Discontinued aspirin.  GIB.  EGD: Multiple recently bleeding angioectasias in the stomach. Diverticulosis in the sigmoid colon. One 9 mm polyp in the cecum, removed with a hot snare per  Dr. Allen Norris. Follow-up GI as outpatient.  * IDDM SSI Hold lantus due to hypoglycemia. Hypoglycemia improved with orange juice and diet. Hold Lantus and may resume if BS is high, follow-up PCP for adjustment.  * HTN, BP is normal. resume amlodipine.  * Bladder cancer Follows with urology No hematuria on UA.  CKD stage 3, stable.  Generalized weakness. PT evaluation: HHPT.  DISCHARGE CONDITIONS:  Stable, discharge to home with home health and PT today. CONSULTS OBTAINED:  Treatment Team:  [name], MD DRUG ALLERGIES:   Allergies  Allergen Reactions  . Penicillins Other (See Comments)    "unknown"   DISCHARGE MEDICATIONS:   Allergies as of 08/01/2016      Reactions   Penicillins Other (See Comments)   "unknown"      Medication List    STOP taking these medications   aspirin 81 MG tablet   LANTUS SOLOSTAR 100 UNIT/ML Solostar Pen Generic drug:  Insulin Glargine   sitaGLIPtin 25 MG tablet Commonly known as:  JANUVIA     TAKE these medications   amLODipine 10 MG tablet Commonly known as:  NORVASC Take 1 tablet (10 mg total) by mouth daily.   NAMENDA 10 MG tablet Generic drug:  memantine Take 10 mg by mouth 2 (two) times daily.   simvastatin 20 MG tablet Commonly known as:  ZOCOR Take 20 mg by mouth daily at 12 noon.        DISCHARGE INSTRUCTIONS:  See AVS. If you experience worsening of your admission symptoms, develop shortness of breath, life threatening emergency, suicidal or homicidal thoughts you must seek medical attention immediately by calling 911 or calling your MD immediately  if symptoms less severe.  You Must read complete instructions/literature along with all the possible adverse reactions/side effects for all the Medicines you take and that have been prescribed to you. Take any new Medicines after you have completely understood and accpet all the possible adverse reactions/side effects.   Please note  You were cared for by a hospitalist during your hospital stay. If you have any questions about your discharge medications or the care you received while you were in the hospital after you are discharged, you  can call the unit and asked to speak with the hospitalist on call if the hospitalist that took care of you is not available. Once you are discharged, your primary care physician will handle any further medical issues. Please note that NO REFILLS for any discharge medications will be authorized once you are discharged, as it is imperative that you return to your primary care physician (or establish a relationship with a primary care physician if you do not have one) for your aftercare needs so that they can reassess your need  for medications and monitor your lab values.    On the day of Discharge:  VITAL SIGNS:  Blood pressure (!) 179/53, pulse 84, temperature 98.1 F (36.7 C), temperature source Oral, resp. rate 16, height 5\' 7"  (1.702 m), weight 130 lb (59 kg), SpO2 99 %. PHYSICAL EXAMINATION:  GENERAL:  83 y.o.-year-old patient lying in the bed with no acute distress.  EYES: Pupils equal, round, reactive to light and accommodation. No scleral icterus. Extraocular muscles intact.  HEENT: Head atraumatic, normocephalic. Oropharynx and nasopharynx clear.  NECK:  Supple, no jugular venous distention. No thyroid enlargement, no tenderness.  LUNGS: Normal breath sounds bilaterally, no wheezing, rales,rhonchi or crepitation. No use of accessory muscles of respiration.  CARDIOVASCULAR: S1, S2 normal. No murmurs, rubs, or gallops.  ABDOMEN: Soft, non-tender, non-distended. Bowel sounds present. No organomegaly or mass.  EXTREMITIES: No pedal edema, cyanosis, or clubbing.  NEUROLOGIC: Cranial nerves II through XII are intact. Muscle strength 5/5 in all extremities. Sensation intact. Gait not checked.  PSYCHIATRIC: The patient is alert and oriented x 3.  SKIN: No obvious rash, lesion, or ulcer.  DATA REVIEW:   CBC  Recent Labs Lab 07/31/16 0500 08/01/16 0422  WBC 5.2  --   HGB 8.1* 8.2*  HCT 25.0*  --   PLT 131*  --     Chemistries   Recent Labs Lab 07/30/16 1432 07/31/16 0500  NA 135 136  K 4.2 4.4  CL 105 106  CO2 25 25  GLUCOSE 203* 135*  BUN 29* 26*  CREATININE 1.55* 1.46*  CALCIUM 8.4* 8.0*  AST 23  --   ALT 16*  --   ALKPHOS 103  --   BILITOT 0.4  --      Microbiology Results  Results for orders placed or performed in visit on 07/29/16  Microscopic Examination     Status: Abnormal   Collection Time: 07/29/16  1:30 PM  Result Value Ref Range Status   WBC, UA 11-30 (A) 0 - 5 /hpf Final   RBC, UA 0-2 0 - 2 /hpf Final   Epithelial Cells (non renal) None seen 0 - 10 /hpf Final    Bacteria, UA Few (A) None seen/Few Final   Yeast, UA Present (A) None seen Final    RADIOLOGY:  No results found.   Management plans discussed with the patient, family and they are in agreement.  CODE STATUS: DNR   TOTAL TIME TAKING CARE OF THIS PATIENT: 81 minutes.    Demetrios Loll M.D on 08/01/2016 at 1:17 PM  Between 7am to 6pm - Pager - [phone]  After 6pm go to [email or web address] - [employer] [place] [facility]  Office  [phone]  CC: Primary care physician; Kirk Ruths, MD   Note: This dictation was prepared with Dragon dictation along with smaller phrase technology. Any transcriptional errors that result from this process are unintentional.

## 2016-08-01 NOTE — Plan of Care (Signed)
Problem: Education: Goal: Knowledge of [place] [facility] Education information/materials will improve Outcome: Progressing Bradycardic during shift, WDL at recheck.  VSS otherwise.  Free of falls during shift.  Ambulated to bathroom multiple times.  Denies pain.  Pt disoriented to time, place, and situation, needs frequent reorientation d/t memory impairment.  No other needs overnight.  Bed changed to low bed, call bell within reach.  WCTM.

## 2016-08-01 NOTE — Evaluation (Signed)
Physical Therapy Evaluation Patient Details Name: Colton Tyler MRN: [id number] DOB: Feb 15, 1926 Today's Date: 08/01/2016   History of Present Illness  presented to ER from oncologist office due to anemia (5.4); admitted with iron deficiency anemia (current HgB 8.2).  Clinical Impression  Upon evaluation, patient alert and oriented; follows all commands and demonstrates fair awareness/insight.  bilat UE/LE strength and ROM grossly symmetrical and WFL for basic transfers and mobility.  Able to complete bed mobility indep; sit/stand, basic transfers and gait (>200') with RW, cga/close sup.  Limited balance reactions evident, as patient tends to rely heavily on LE step strategy for balance recovery.  Poor functional reach (2-3") with standing activities, decreased cadence/gait speed--both indicative of increased fall risk. Do recommend continued use of RW for all functional activities and gait at this time.  Patient/daughter voice understanding and awareness.  Report access to RW in home environment. Would benefit from skilled PT to address above deficits and promote optimal return to PLOF; Recommend transition to [place] upon discharge from acute hospitalization.     Follow Up Recommendations Home health PT    Equipment Recommendations  Rolling walker with 5" wheels (has RW within the home)    Recommendations for Other Services       Precautions / Restrictions Precautions Precautions: Fall Precaution Comments: R chest port Restrictions Weight Bearing Restrictions: No      Mobility  Bed Mobility Overal bed mobility: Modified Independent                Transfers Overall transfer level: Needs assistance Equipment used: Rolling walker (2 wheeled) Transfers: Sit to/from Stand Sit to Stand: Min guard            Ambulation/Gait Ambulation/Gait assistance: Min guard Ambulation Distance (Feet): 400 Feet Assistive device: Rolling walker (2 wheeled)   Gait velocity: 10' walk  time, 9-10 seconds   General Gait Details: reciprocal stepping pattern, mildly antalgic to L LE; higher level balance deficits noted (relies primarily on LE step strategy for balance recovery).  Decreased cadence and gait speed  Stairs            Wheelchair Mobility    Modified Rankin (Stroke Patients Only)       Balance Overall balance assessment: Needs assistance Sitting-balance support: No upper extremity supported;Feet supported Sitting balance-Leahy Scale: Good     Standing balance support: No upper extremity supported Standing balance-Leahy Scale: Poor Standing balance comment: standing functional reach only 2-3" requiring contralateral UE support for optimal safety/stability                             Pertinent Vitals/Pain Pain Assessment: No/denies pain    Home Living Family/patient expects to be discharged to:: Private residence Living Arrangements: Spouse/significant other Available Help at Discharge: Family (daughter and grand-daughter provide daily check in and assist as needed) Type of Home: House Home Access: Stairs to enter Entrance Stairs-Rails: Right;Left;Can reach both Entrance Stairs-Number of Steps: 3 Home Layout: One level        Prior Function Level of Independence: Independent with assistive device(s)         Comments: Mod indep with intermittent use of SPC as needed; endorses single fall within previous 6 months.     Hand Dominance        Extremity/Trunk Assessment   Upper Extremity Assessment Upper Extremity Assessment: Overall WFL for tasks assessed    Lower Extremity Assessment Lower Extremity Assessment: Overall WFL for tasks  assessed       Communication   Communication: No difficulties  Cognition Arousal/Alertness: Awake/alert Behavior During Therapy: WFL for tasks assessed/performed Overall Cognitive Status: Within Functional Limits for tasks assessed                                         General Comments      Exercises Other Exercises Other Exercises: Toileting at standard toilet, cga/min assist for short-distance gait (patient tends to abandon RW upon approach) and standing balance.  Constant reaching for walls/furniture during gait efforts without RW.  Poor functional reach indicative of incresed fall risk.  Do recommend continued use of RW for all functional activities at this time; patient/daughter voiced understanding and awareness.   Assessment/Plan    PT Assessment Patient needs continued PT services  PT Problem List Decreased strength;Decreased activity tolerance;Decreased balance;Decreased mobility       PT Treatment Interventions DME instruction;Gait training;Stair training;Functional mobility training;Therapeutic activities;Therapeutic exercise;Balance training;Patient/family education    PT Goals (Current goals can be found in the Care Plan section)  Acute Rehab PT Goals Patient Stated Goal: to return home PT Goal Formulation: With patient/family Time For Goal Achievement: 08/15/16 Potential to Achieve Goals: Good    Frequency Min 2X/week   Barriers to discharge Decreased caregiver support      Co-evaluation               AM-PAC PT "6 Clicks" Daily Activity  Outcome Measure Difficulty turning over in bed (including adjusting bedclothes, sheets and blankets)?: None Difficulty moving from lying on back to sitting on the side of the bed? : None Difficulty sitting down on and standing up from a chair with arms (e.g., wheelchair, bedside commode, etc,.)?: None Help needed moving to and from a bed to chair (including a wheelchair)?: A Little Help needed walking in hospital room?: A Little Help needed climbing 3-5 steps with a railing? : A Little 6 Click Score: 21    End of Session Equipment Utilized During Treatment: Gait belt Activity Tolerance: Patient tolerated treatment well Patient left: in bed;with call bell/phone within reach;with  family/visitor present;with bed alarm set Nurse Communication: Mobility status PT Visit Diagnosis: Difficulty in walking, not elsewhere classified (R26.2);Muscle weakness (generalized) (M62.81)    Time: 2122-4825 PT Time Calculation (min) (ACUTE ONLY): 22 min   Charges:   PT Evaluation $PT Eval Low Complexity: 1 Procedure PT Treatments $Therapeutic Activity: 8-22 mins   PT G Codes:        Aveena Bari H. Owens Shark, PT, DPT, NCS 08/01/16, 11:46 AM [phone]

## 2016-08-01 NOTE — Progress Notes (Signed)
Hypoglycemic Event  CBG: 45  Treatment: 15 GM carbohydrate snack - 8 oz of apple juice, peanut butter and graham cracker due to repeated event, regimen repeated   Symptoms: None; Patient denies any smptoms  Follow-up CBG: Time:0800, 0821 and 0832   CBG Results: 51, 69 and 75 (fiinal)  Possible Reasons for Event: Unknown - second hypocglycemic event in two days  Comments/MD notified: Dr Bridgett Larsson @ [phone]; states to continue with protocol and feed patient  Dietary services called @ [phone] for STAT meal tray  Meal Tray arrived at [place]  Diabetes Coordinator paged as well for update on patient status.    [name]

## 2016-08-01 NOTE — Discharge Instructions (Signed)
Heart healthy and ADA diet. °

## 2016-08-01 NOTE — Care Management Note (Signed)
Case Management Note  Patient Details  Name: Colton Tyler MRN: [id number] Date of Birth: 16-Dec-1925  Subjective/Objective:       Discussed discharge planning with daughter Diane who also requested to be the contact for home health appointments. Referral for HH-PT was called to [facility] and daughters phone number was provided to [facility].              Action/Plan:   Expected Discharge Date:  08/01/16               Expected Discharge Plan:   08/01/16  In-House Referral:     Discharge planning Services     Post Acute Care Choice:   Yes Choice offered to:   Patient and daughter Colton Tyler.  DME Arranged:   NA DME Agency:   NA  HH Arranged:   PT HH Agency:   [facility]  Status of Service:   Completed  If discussed at [place] of Stay Meetings, dates discussed:    Additional Comments:  Aylani Spurlock A, RN 08/01/2016, 12:16 PM

## 2016-08-04 ENCOUNTER — Ambulatory Visit (INDEPENDENT_AMBULATORY_CARE_PROVIDER_SITE_OTHER): Payer: Medicare Other | Admitting: Urology

## 2016-08-04 ENCOUNTER — Encounter: Payer: Self-pay | Admitting: Gastroenterology

## 2016-08-04 VITALS — BP 143/55 | HR 71 | Ht 67.0 in | Wt 132.0 lb

## 2016-08-04 DIAGNOSIS — C679 Malignant neoplasm of bladder, unspecified: Secondary | ICD-10-CM

## 2016-08-04 LAB — URINALYSIS, COMPLETE
Bilirubin, UA: NEGATIVE
GLUCOSE, UA: NEGATIVE
KETONES UA: NEGATIVE
Nitrite, UA: NEGATIVE
RBC, UA: NEGATIVE
SPEC GRAV UA: 1.015 (ref 1.005–1.030)
Urobilinogen, Ur: 0.2 mg/dL (ref 0.2–1.0)
pH, UA: 7 (ref 5.0–7.5)

## 2016-08-04 LAB — MICROSCOPIC EXAMINATION
RBC, UA: NONE SEEN /hpf (ref 0–?)
WBC, UA: >30 /hpf — ABNORMAL HIGH (ref 0–?)

## 2016-08-04 MED ORDER — BCG LIVE 50 MG IS SUSR
3.2400 mL | Freq: Once | INTRAVESICAL | Status: AC
  Administered 2016-08-04: 81 mg via INTRAVESICAL

## 2016-08-04 NOTE — Anesthesia Postprocedure Evaluation (Signed)
Anesthesia Post Note  Patient: Colton Tyler  Procedure(s) Performed: Procedure(s) (LRB): ESOPHAGOGASTRODUODENOSCOPY (EGD) WITH PROPOFOL (N/A) COLONOSCOPY WITH PROPOFOL (N/A)  Patient location during evaluation: PACU Anesthesia Type: General Level of consciousness: awake and alert Pain management: pain level controlled Vital Signs Assessment: post-procedure vital signs reviewed and stable Respiratory status: spontaneous breathing, nonlabored ventilation, respiratory function stable and patient connected to nasal cannula oxygen Cardiovascular status: blood pressure returned to baseline and stable Postop Assessment: no signs of nausea or vomiting Anesthetic complications: no     Last Vitals:  Vitals:   08/01/16 0819 08/01/16 0830  BP: (!) 181/96 (!) 179/53  Pulse: 84   Resp: 16   Temp:      Last Pain:  Vitals:   08/01/16 1202  TempSrc:   PainSc: 0-No pain                 Molli Barrows

## 2016-08-04 NOTE — Progress Notes (Signed)
BCG Bladder Instillation  BCG # 3  Due to Bladder Cancer patient is present today for a BCG treatment. Patient was cleaned and prepped in a sterile fashion with betadine and lidocaine 2% jelly was instilled into the urethra.  A 14FR catheter was inserted, urine return was noted 67ml, urine was yellow in color.  72ml of reconstituted BCG was instilled into the bladder. The catheter was then removed. Patient tolerated well, no complications were noted  Preformed by: Elberta Leatherwood, CMA, Lyndee Hensen, CMA  Follow up/ Additional notes: 3 months for Cysto

## 2016-08-05 LAB — SURGICAL PATHOLOGY

## 2016-08-06 ENCOUNTER — Encounter: Payer: Self-pay | Admitting: Gastroenterology

## 2016-09-25 ENCOUNTER — Inpatient Hospital Stay: Payer: Medicare Other | Attending: Hematology and Oncology

## 2016-09-25 DIAGNOSIS — C679 Malignant neoplasm of bladder, unspecified: Secondary | ICD-10-CM | POA: Diagnosis present

## 2016-09-25 DIAGNOSIS — Z923 Personal history of irradiation: Secondary | ICD-10-CM | POA: Insufficient documentation

## 2016-09-25 DIAGNOSIS — Z8507 Personal history of malignant neoplasm of pancreas: Secondary | ICD-10-CM | POA: Insufficient documentation

## 2016-09-25 DIAGNOSIS — Z9221 Personal history of antineoplastic chemotherapy: Secondary | ICD-10-CM | POA: Diagnosis not present

## 2016-09-25 DIAGNOSIS — Z452 Encounter for adjustment and management of vascular access device: Secondary | ICD-10-CM | POA: Diagnosis not present

## 2016-09-25 DIAGNOSIS — Z95828 Presence of other vascular implants and grafts: Secondary | ICD-10-CM

## 2016-09-25 MED ORDER — SODIUM CHLORIDE 0.9% FLUSH
10.0000 mL | INTRAVENOUS | Status: DC | PRN
  Administered 2016-09-25: 10 mL via INTRAVENOUS
  Filled 2016-09-25: qty 10

## 2016-09-25 MED ORDER — HEPARIN SOD (PORK) LOCK FLUSH 100 UNIT/ML IV SOLN
500.0000 [IU] | Freq: Once | INTRAVENOUS | Status: AC
  Administered 2016-09-25: 500 [IU] via INTRAVENOUS

## 2016-10-29 ENCOUNTER — Encounter: Payer: Self-pay | Admitting: Hematology and Oncology

## 2016-11-20 ENCOUNTER — Inpatient Hospital Stay: Payer: Medicare Other | Attending: Hematology and Oncology

## 2016-11-20 DIAGNOSIS — C672 Malignant neoplasm of lateral wall of bladder: Secondary | ICD-10-CM | POA: Diagnosis not present

## 2016-11-20 DIAGNOSIS — Z452 Encounter for adjustment and management of vascular access device: Secondary | ICD-10-CM | POA: Insufficient documentation

## 2016-11-20 DIAGNOSIS — Z95828 Presence of other vascular implants and grafts: Secondary | ICD-10-CM

## 2016-11-20 MED ORDER — SODIUM CHLORIDE 0.9% FLUSH
10.0000 mL | INTRAVENOUS | Status: DC | PRN
  Administered 2016-11-20: 10 mL via INTRAVENOUS
  Filled 2016-11-20: qty 10

## 2016-11-20 MED ORDER — HEPARIN SOD (PORK) LOCK FLUSH 100 UNIT/ML IV SOLN
500.0000 [IU] | Freq: Once | INTRAVENOUS | Status: AC
  Administered 2016-11-20: 500 [IU] via INTRAVENOUS

## 2016-11-30 ENCOUNTER — Encounter: Payer: Self-pay | Admitting: Urology

## 2016-11-30 ENCOUNTER — Ambulatory Visit (INDEPENDENT_AMBULATORY_CARE_PROVIDER_SITE_OTHER): Payer: Medicare Other | Admitting: Urology

## 2016-11-30 ENCOUNTER — Other Ambulatory Visit: Payer: Self-pay | Admitting: Radiology

## 2016-11-30 VITALS — BP 152/69 | HR 66 | Ht 66.0 in | Wt 120.0 lb

## 2016-11-30 DIAGNOSIS — C672 Malignant neoplasm of lateral wall of bladder: Secondary | ICD-10-CM

## 2016-11-30 LAB — MICROSCOPIC EXAMINATION
Epithelial Cells (non renal): NONE SEEN /hpf (ref 0–10)
RBC, UA: NONE SEEN /hpf (ref 0–?)

## 2016-11-30 LAB — URINALYSIS, COMPLETE
BILIRUBIN UA: NEGATIVE
Glucose, UA: NEGATIVE
KETONES UA: NEGATIVE
Nitrite, UA: NEGATIVE
PROTEIN UA: NEGATIVE
RBC UA: NEGATIVE
SPEC GRAV UA: 1.01 (ref 1.005–1.030)
Urobilinogen, Ur: 0.2 mg/dL (ref 0.2–1.0)
pH, UA: 5.5 (ref 5.0–7.5)

## 2016-11-30 MED ORDER — LIDOCAINE HCL 2 % EX GEL
1.0000 "application " | Freq: Once | CUTANEOUS | Status: AC
  Administered 2016-11-30: 1 via URETHRAL

## 2016-11-30 MED ORDER — CIPROFLOXACIN HCL 500 MG PO TABS
500.0000 mg | ORAL_TABLET | Freq: Once | ORAL | Status: AC
  Administered 2016-11-30: 500 mg via ORAL

## 2016-11-30 NOTE — Progress Notes (Signed)
   11/30/16  CC:  Chief Complaint  Patient presents with  . 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  Underwent repeat TURBT 10/2015 for 2 cm area of patchy erythema involving the right lateral bladder wall beyond the right UO, heavytrabeculation, no obvious papillary lesions. 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 The suspicious area was fulgurated completely. Due to his age and frailty he declined re-TUR and cystectomy. BCG induction was started.   Last staging: CT May 2018. There was as suspicious are on liver, but that was benign on MRI Liver  Last BCG: May 2018 x 3; Nov 2017 x 6  Last cysto: May 2018 benign   He's doing well. No gross hematuria. UA clear.    There were no vitals taken for this visit. NED. A&Ox3.   No respiratory distress   Abd soft, NT, ND Normal phallus with bilateral descended testicles  Cystoscopy Procedure Note  Patient identification was confirmed, informed consent was obtained, and patient was prepped using Betadine solution.  Lidocaine jelly was administered per urethral meatus.    Preoperative abx where received prior to procedure.     Pre-Procedure: - Inspection reveals a normal caliber ureteral meatus.  Procedure: The flexible cystoscope was introduced without difficulty - No urethral strictures/lesions are present. - prostate normal  - bladder neck normal  - Bilateral ureteral orifices identified - Bladder mucosa a raised/thickended possible early papillary area just later to Right UO and possibly over the intramural ureter concerning for recurrence.  - No bladder stones - No trabeculation  Retroflexion shows normal bladder neck, same findings as above around the right UO.    Post-Procedure: - Patient tolerated the procedure  well  Assessment/ Plan:  Bladder ca - History of high-grade T2 disease with a high-grade T1 recurrence lateral to the right UO. Recent CT staging was negative, but cystoscopy today shows possible early recurrence. I discussed with pt and his daughter the nature risks benefits and alternatives to cysto bbx/possible TURBT possible ureteral stent placement and retrograde required. All questions answered and elected to proceed. This area might best be viewed in the OR with a 70 lens.

## 2016-12-02 ENCOUNTER — Encounter
Admission: RE | Admit: 2016-12-02 | Discharge: 2016-12-02 | Disposition: A | Discharge status: Home / Self Care | Payer: Medicare Other | Source: Ambulatory Visit | Attending: Urology | Admitting: Urology

## 2016-12-02 DIAGNOSIS — E1122 Type 2 diabetes mellitus with diabetic chronic kidney disease: Secondary | ICD-10-CM | POA: Diagnosis not present

## 2016-12-02 DIAGNOSIS — D649 Anemia, unspecified: Secondary | ICD-10-CM | POA: Insufficient documentation

## 2016-12-02 DIAGNOSIS — I2510 Atherosclerotic heart disease of native coronary artery without angina pectoris: Secondary | ICD-10-CM | POA: Diagnosis not present

## 2016-12-02 DIAGNOSIS — C672 Malignant neoplasm of lateral wall of bladder: Secondary | ICD-10-CM | POA: Diagnosis not present

## 2016-12-02 DIAGNOSIS — E785 Hyperlipidemia, unspecified: Secondary | ICD-10-CM | POA: Insufficient documentation

## 2016-12-02 DIAGNOSIS — Z01818 Encounter for other preprocedural examination: Secondary | ICD-10-CM | POA: Insufficient documentation

## 2016-12-02 DIAGNOSIS — I129 Hypertensive chronic kidney disease with stage 1 through stage 4 chronic kidney disease, or unspecified chronic kidney disease: Secondary | ICD-10-CM | POA: Diagnosis not present

## 2016-12-02 DIAGNOSIS — N183 Chronic kidney disease, stage 3 (moderate): Secondary | ICD-10-CM | POA: Insufficient documentation

## 2016-12-02 LAB — BASIC METABOLIC PANEL
Anion gap: 6 (ref 5–15)
BUN: 21 mg/dL — ABNORMAL HIGH (ref 6–20)
CO2: 27 mmol/L (ref 22–32)
Calcium: 8.8 mg/dL — ABNORMAL LOW (ref 8.9–10.3)
Chloride: 109 mmol/L (ref 101–111)
Creatinine, Ser: 1.78 mg/dL — ABNORMAL HIGH (ref 0.61–1.24)
GFR calc non Af Amer: 32 mL/min — ABNORMAL LOW (ref >60)
GFR, EST AFRICAN AMERICAN: 37 mL/min — AB (ref >60)
Glucose, Bld: 172 mg/dL — ABNORMAL HIGH (ref 65–99)
POTASSIUM: 3.8 mmol/L (ref 3.5–5.1)
SODIUM: 142 mmol/L (ref 135–145)

## 2016-12-02 LAB — CBC
HEMATOCRIT: 32.2 % — AB (ref 40.0–52.0)
HEMOGLOBIN: 10.2 g/dL — AB (ref 13.0–18.0)
MCH: 27.6 pg (ref 26.0–34.0)
MCHC: 31.7 g/dL — ABNORMAL LOW (ref 32.0–36.0)
MCV: 86.9 fL (ref 80.0–100.0)
Platelets: 123 10*3/uL — ABNORMAL LOW (ref 150–440)
RBC: 3.71 MIL/uL — AB (ref 4.40–5.90)
RDW: 23.1 % — ABNORMAL HIGH (ref 11.5–14.5)
WBC: 4.2 10*3/uL (ref 3.8–10.6)

## 2016-12-02 LAB — APTT: aPTT: 31 seconds (ref 24–36)

## 2016-12-02 LAB — PROTIME-INR
INR: 1.1
Prothrombin Time: 14.1 seconds (ref 11.4–15.2)

## 2016-12-02 NOTE — Patient Instructions (Signed)
Your procedure is scheduled on: Monday, December 07, 2016 Report to Same Day Surgery on the 2nd floor in the [facility]. To find out your arrival time, please call [phone] between 1PM - 3PM on: Friday, December 04, 2016  REMEMBER: Instructions that are not followed completely may result in serious medical risk up to and including death; or upon the discretion of your surgeon and anesthesiologist your surgery may need to be rescheduled.  Do not eat food or drink liquids after midnight. No gum chewing or hard candies.  You may however, drink WATER ONLY up to 2 hours before you are scheduled to arrive at the hospital for your procedure.  Do not drink ANY WATER within 2 hours of your scheduled arrival to the hospital as this may lead to your procedure being delayed or rescheduled.  No Alcohol for 24 hours before or after surgery.  No Smoking for 24 hours prior to surgery.  Notify your doctor if there is any change in your medical condition (cold, fever, infection).  Do not wear jewelry, make-up, hairpins, clips or nail polish.  Do not wear lotions, powders, or perfumes.   Do not shave 48 hours prior to surgery. Men may shave face and neck.  Contacts and dentures may not be worn into surgery.  Do not bring valuables to the hospital. [facility] is not responsible for any belongings or valuables.   TAKE THESE MEDICATIONS THE MORNING OF SURGERY WITH A SIP OF WATER:  NONE  Take 1/2 of usual insulin dose the night before surgery and none on the morning of surgery. (ONLY TAKE 10 UNITS OF LANTUS THE NIGHT BEFORE SURGERY)  Stop Anti-inflammatories such as Advil, Aleve, Ibuprofen, Motrin, Naproxen, Naprosyn, Goodie powder, or aspirin products. (May take Tylenol or Acetaminophen if needed.)  Stop OVER THE COUNTER supplements until after surgery.   If you are being discharged the day of surgery, you will not be allowed to drive home. You will need someone to drive you home and stay  with you that night.   If you are taking public transportation, you will need to have a responsible adult to with you.  Please call the number above if you have any questions about these instructions.

## 2016-12-02 NOTE — Pre-Procedure Instructions (Signed)
Cardiac clearance form faxed to Dr. Saralyn Pilar.  Copy and pasted EKG from Care Everywhere:  ECG 12-lead8/11/2016 [place] Component Name Value Ref Range  Vent Rate (bpm) 69   PR Interval (msec) 200   QRS Interval (msec) 108   QT Interval (msec) 442   QTc (msec) 473   Result Narrative  Sinus rhythm with frequent premature ventricular complexes Left anterior fascicular block Moderate voltage criteria for LVH, may be normal variant Septal infarct , age undetermined T wave abnormality, consider lateral ischemia Abnormal ECG When compared with ECG of 18-Jun-2016 14:57, Nonspecific T wave abnormality, worse in Inferior leads Nonspecific T wave abnormalities now evident in Anterior leads QT has lengthened I reviewed and concur with this report. Electronically signed [email or web address], MD, ALEX ([id number]) on 10/20/2016 7:51:13 AM

## 2016-12-03 LAB — CULTURE, URINE COMPREHENSIVE

## 2016-12-04 ENCOUNTER — Other Ambulatory Visit: Payer: Self-pay | Admitting: Radiology

## 2016-12-06 MED ORDER — CEFAZOLIN SODIUM-DEXTROSE 2-4 GM/100ML-% IV SOLN
2.0000 g | INTRAVENOUS | Status: AC
  Administered 2016-12-07: 2 g via INTRAVENOUS

## 2016-12-07 ENCOUNTER — Ambulatory Visit: Payer: Medicare Other | Admitting: Anesthesiology

## 2016-12-07 ENCOUNTER — Encounter
Admission: RE | Disposition: A | Discharge status: Home / Self Care | Payer: Self-pay | Source: Ambulatory Visit | Attending: Urology

## 2016-12-07 ENCOUNTER — Encounter: Payer: Self-pay | Admitting: *Deleted

## 2016-12-07 ENCOUNTER — Ambulatory Visit
Admission: RE | Admit: 2016-12-07 | Discharge: 2016-12-07 | Disposition: A | Discharge status: Home / Self Care | Payer: Medicare Other | Source: Ambulatory Visit | Attending: Urology | Admitting: Urology

## 2016-12-07 DIAGNOSIS — Z8551 Personal history of malignant neoplasm of bladder: Secondary | ICD-10-CM | POA: Insufficient documentation

## 2016-12-07 DIAGNOSIS — N2889 Other specified disorders of kidney and ureter: Secondary | ICD-10-CM | POA: Diagnosis not present

## 2016-12-07 DIAGNOSIS — E785 Hyperlipidemia, unspecified: Secondary | ICD-10-CM | POA: Insufficient documentation

## 2016-12-07 DIAGNOSIS — I10 Essential (primary) hypertension: Secondary | ICD-10-CM | POA: Diagnosis not present

## 2016-12-07 DIAGNOSIS — Z79899 Other long term (current) drug therapy: Secondary | ICD-10-CM | POA: Diagnosis not present

## 2016-12-07 DIAGNOSIS — Z794 Long term (current) use of insulin: Secondary | ICD-10-CM | POA: Diagnosis not present

## 2016-12-07 DIAGNOSIS — C679 Malignant neoplasm of bladder, unspecified: Secondary | ICD-10-CM | POA: Diagnosis not present

## 2016-12-07 DIAGNOSIS — N3289 Other specified disorders of bladder: Secondary | ICD-10-CM

## 2016-12-07 DIAGNOSIS — E119 Type 2 diabetes mellitus without complications: Secondary | ICD-10-CM | POA: Diagnosis not present

## 2016-12-07 DIAGNOSIS — Z8507 Personal history of malignant neoplasm of pancreas: Secondary | ICD-10-CM | POA: Insufficient documentation

## 2016-12-07 DIAGNOSIS — N329 Bladder disorder, unspecified: Secondary | ICD-10-CM | POA: Diagnosis present

## 2016-12-07 DIAGNOSIS — I2510 Atherosclerotic heart disease of native coronary artery without angina pectoris: Secondary | ICD-10-CM | POA: Diagnosis not present

## 2016-12-07 DIAGNOSIS — D494 Neoplasm of unspecified behavior of bladder: Secondary | ICD-10-CM

## 2016-12-07 DIAGNOSIS — N134 Hydroureter: Secondary | ICD-10-CM | POA: Insufficient documentation

## 2016-12-07 DIAGNOSIS — C672 Malignant neoplasm of lateral wall of bladder: Secondary | ICD-10-CM

## 2016-12-07 HISTORY — PX: CYSTOSCOPY W/ RETROGRADES: SHX1426

## 2016-12-07 HISTORY — PX: CYSTOSCOPY WITH STENT PLACEMENT: SHX5790

## 2016-12-07 HISTORY — PX: CYSTOSCOPY WITH BIOPSY: SHX5122

## 2016-12-07 LAB — GLUCOSE, CAPILLARY
GLUCOSE-CAPILLARY: 150 mg/dL — AB (ref 65–99)
Glucose-Capillary: 188 mg/dL — ABNORMAL HIGH (ref 65–99)

## 2016-12-07 LAB — BUN: BUN: 28 mg/dL — ABNORMAL HIGH (ref 6–20)

## 2016-12-07 LAB — CREATININE, SERUM
Creatinine, Ser: 1.55 mg/dL — ABNORMAL HIGH (ref 0.61–1.24)
GFR calc non Af Amer: 38 mL/min — ABNORMAL LOW (ref >60)
GFR, EST AFRICAN AMERICAN: 44 mL/min — AB (ref >60)

## 2016-12-07 SURGERY — CYSTOSCOPY, WITH BIOPSY
Anesthesia: General | Site: Ureter | Laterality: Right | Wound class: Clean Contaminated

## 2016-12-07 MED ORDER — LIDOCAINE HCL (PF) 2 % IJ SOLN
INTRAMUSCULAR | Status: AC
Start: 2016-12-07 — End: 2016-12-07
  Filled 2016-12-07: qty 4

## 2016-12-07 MED ORDER — DEXAMETHASONE SODIUM PHOSPHATE 10 MG/ML IJ SOLN
INTRAMUSCULAR | Status: DC | PRN
  Administered 2016-12-07: 5 mg via INTRAVENOUS

## 2016-12-07 MED ORDER — IOTHALAMATE MEGLUMINE 43 % IV SOLN
INTRAVENOUS | Status: DC | PRN
  Administered 2016-12-07: 15 mL via URETHRAL

## 2016-12-07 MED ORDER — GLYCOPYRROLATE 0.2 MG/ML IJ SOLN
INTRAMUSCULAR | Status: AC
  Filled 2016-12-07: qty 1

## 2016-12-07 MED ORDER — OXYCODONE HCL 5 MG/5ML PO SOLN: 5.0000 mg | Freq: Once | ORAL | Status: DC | PRN

## 2016-12-07 MED ORDER — MEPERIDINE HCL 50 MG/ML IJ SOLN: 6.2500 mg | INTRAMUSCULAR | Status: DC | PRN

## 2016-12-07 MED ORDER — EPHEDRINE SULFATE 50 MG/ML IJ SOLN
INTRAMUSCULAR | Status: AC
  Filled 2016-12-07: qty 1

## 2016-12-07 MED ORDER — OXYCODONE HCL 5 MG PO TABS: 5.0000 mg | ORAL_TABLET | Freq: Once | ORAL | Status: DC | PRN

## 2016-12-07 MED ORDER — FAMOTIDINE 20 MG PO TABS
20.0000 mg | ORAL_TABLET | Freq: Once | ORAL | Status: AC
  Administered 2016-12-07: 20 mg via ORAL

## 2016-12-07 MED ORDER — FENTANYL CITRATE (PF) 100 MCG/2ML IJ SOLN
INTRAMUSCULAR | Status: AC
  Filled 2016-12-07: qty 2

## 2016-12-07 MED ORDER — FENTANYL CITRATE (PF) 100 MCG/2ML IJ SOLN
INTRAMUSCULAR | Status: DC | PRN
  Administered 2016-12-07 (×2): 25 ug via INTRAVENOUS

## 2016-12-07 MED ORDER — ONDANSETRON HCL 4 MG/2ML IJ SOLN
INTRAMUSCULAR | Status: DC | PRN
  Administered 2016-12-07: 4 mg via INTRAVENOUS

## 2016-12-07 MED ORDER — SUCCINYLCHOLINE CHLORIDE 20 MG/ML IJ SOLN
INTRAMUSCULAR | Status: AC
  Filled 2016-12-07: qty 1

## 2016-12-07 MED ORDER — FAMOTIDINE 20 MG PO TABS
ORAL_TABLET | ORAL | Status: AC
  Filled 2016-12-07: qty 1

## 2016-12-07 MED ORDER — CEFAZOLIN SODIUM-DEXTROSE 2-4 GM/100ML-% IV SOLN
INTRAVENOUS | Status: AC
  Filled 2016-12-07: qty 100

## 2016-12-07 MED ORDER — PROMETHAZINE HCL 25 MG/ML IJ SOLN: 6.2500 mg | INTRAMUSCULAR | Status: DC | PRN

## 2016-12-07 MED ORDER — SODIUM CHLORIDE 0.9 % IV SOLN
INTRAVENOUS | Status: DC
  Administered 2016-12-07: 07:00:00 via INTRAVENOUS

## 2016-12-07 MED ORDER — DEXAMETHASONE SODIUM PHOSPHATE 10 MG/ML IJ SOLN
INTRAMUSCULAR | Status: AC
Start: 2016-12-07 — End: 2016-12-07
  Filled 2016-12-07: qty 1

## 2016-12-07 MED ORDER — FENTANYL CITRATE (PF) 100 MCG/2ML IJ SOLN: 25.0000 ug | INTRAMUSCULAR | Status: DC | PRN

## 2016-12-07 MED ORDER — ONDANSETRON HCL 4 MG/2ML IJ SOLN
INTRAMUSCULAR | Status: AC
  Filled 2016-12-07: qty 2

## 2016-12-07 MED ORDER — PROPOFOL 10 MG/ML IV BOLUS
INTRAVENOUS | Status: DC | PRN
  Administered 2016-12-07: 100 mg via INTRAVENOUS

## 2016-12-07 MED ORDER — PHENYLEPHRINE HCL 10 MG/ML IJ SOLN
INTRAMUSCULAR | Status: AC
Start: 2016-12-07 — End: 2016-12-07
  Filled 2016-12-07: qty 1

## 2016-12-07 MED ORDER — LIDOCAINE HCL (CARDIAC) 20 MG/ML IV SOLN
INTRAVENOUS | Status: DC | PRN
  Administered 2016-12-07: 60 mg via INTRAVENOUS

## 2016-12-07 MED ORDER — PROPOFOL 10 MG/ML IV BOLUS
INTRAVENOUS | Status: AC
  Filled 2016-12-07: qty 20

## 2016-12-07 SURGICAL SUPPLY — 36 items
BAG DRAIN CYSTO-URO LG1000N (MISCELLANEOUS) ×6 IMPLANT
BAG URO DRAIN 2000ML W/SPOUT (MISCELLANEOUS) ×6 IMPLANT
CATH FOLEY 2WAY  5CC 16FR (CATHETERS)
CATH URETL 5X70 OPEN END (CATHETERS) ×6 IMPLANT
CATH URTH 16FR FL 2W BLN LF (CATHETERS) IMPLANT
CONRAY 43 FOR UROLOGY 50M (MISCELLANEOUS) ×6 IMPLANT
DRAPE UTILITY 15X26 TOWEL STRL (DRAPES) ×6 IMPLANT
DRSG TELFA 4X3 1S NADH ST (GAUZE/BANDAGES/DRESSINGS) ×6 IMPLANT
ELECT LOOP 22F BIPOLAR SML (ELECTROSURGICAL)
ELECT REM PT RETURN 9FT ADLT (ELECTROSURGICAL) ×6
ELECTRODE LOOP 22F BIPOLAR SML (ELECTROSURGICAL) IMPLANT
ELECTRODE REM PT RTRN 9FT ADLT (ELECTROSURGICAL) ×4 IMPLANT
GLOVE BIO SURGEON STRL SZ 6.5 (GLOVE) ×5 IMPLANT
GLOVE BIO SURGEONS STRL SZ 6.5 (GLOVE) ×1
GOWN STRL REUS W/ TWL LRG LVL3 (GOWN DISPOSABLE) ×8 IMPLANT
GOWN STRL REUS W/TWL LRG LVL3 (GOWN DISPOSABLE) ×4
KIT RM TURNOVER CYSTO AR (KITS) ×6 IMPLANT
LOOP CUT BIPOLAR 24F LRG (ELECTROSURGICAL) IMPLANT
NDL SAFETY ECLIPSE 18X1.5 (NEEDLE) ×4 IMPLANT
NEEDLE HYPO 18GX1.5 SHARP (NEEDLE) ×2
PACK CYSTO AR (MISCELLANEOUS) ×6 IMPLANT
SCRUB POVIDONE IODINE 4 OZ (MISCELLANEOUS) ×6 IMPLANT
SENSORWIRE 0.038 NOT ANGLED (WIRE) ×6
SET CYSTO W/LG BORE CLAMP LF (SET/KITS/TRAYS/PACK) IMPLANT
SET IRRIG Y TYPE TUR BLADDER L (SET/KITS/TRAYS/PACK) ×6 IMPLANT
SET IRRIGATING DISP (SET/KITS/TRAYS/PACK) ×6 IMPLANT
SOL .9 NS 3000ML IRR  AL (IV SOLUTION) ×2
SOL .9 NS 3000ML IRR UROMATIC (IV SOLUTION) ×4 IMPLANT
STENT URET 6FRX24 CONTOUR (STENTS) IMPLANT
STENT URET 6FRX26 CONTOUR (STENTS) IMPLANT
STENT URO INLAY 6FRX26CM (STENTS) ×6 IMPLANT
SURGILUBE 2OZ TUBE FLIPTOP (MISCELLANEOUS) ×6 IMPLANT
SYRINGE IRR TOOMEY STRL 70CC (SYRINGE) ×6 IMPLANT
WATER STERILE IRR 1000ML POUR (IV SOLUTION) ×6 IMPLANT
WATER STERILE IRR 3000ML UROMA (IV SOLUTION) ×6 IMPLANT
WIRE SENSOR 0.038 NOT ANGLED (WIRE) ×4 IMPLANT

## 2016-12-07 NOTE — Op Note (Signed)
Date of procedure: 12/07/16  Preoperative diagnosis:  1. History of bladder cancer 2. Suspicious bladder lesion   Postoperative diagnosis:  1. Same as above 2. Mild right caliectasis with subtle ureteral dilation down to UVJ  Procedure: 1. Cystoscopy 2. Bilateral retrograde pyelogram 3. Bladder biopsy 4. Right ureteral stent placement  Surgeon: [name], MD  Anesthesia: General  Complications: None  Intraoperative findings: Irregular slightly raised/thickened urothelium overlying the right intramural ureter, not involving right ureteral orifice. Left retrograde pyelogram unremarkable. Right retrograde with renal pelvic fullness/mild caliectasis with slightly distended ureter down to the level of the UVJ without clear filling defect. Drainage on this side was slightly more sluggish than on the left.  EBL: minimal  Specimens: bladder lesion biopsy, right trigone  Drains: 6 x 26 French double-J ureteral stent, Bard Optima on right  Indication: Colton Tyler is a 83 y.o. patient with with a history of high-grade T2 urothelial carcinoma treated with try mobile therapy on 9/15 with recurrence of high-grade TA-CIS and 10/2015 status post BCG induction therapy.  Most recent cystoscopy showed irregular thickened area overlying right UVJ. Most recent upper tract imaging CT in May 2018 otherwise unremarkable.  After reviewing the management options for treatment, he elected to proceed with the above surgical procedure(s). We have discussed the potential benefits and risks of the procedure, side effects of the proposed treatment, the likelihood of the patient achieving the goals of the procedure, and any potential problems that might occur during the procedure or recuperation. Informed consent has been obtained.  Description of procedure:  The patient was taken to the operating room and general anesthesia was induced.  The patient was placed in the dorsal lithotomy position, prepped and  draped in the usual sterile fashion, and preoperative antibiotics were administered. A preoperative time-out was performed.   A 21 French cystoscope was advanced per urethra into the bladder. The bladder was carefully inspected. It was noted to be mildly trabeculated with a normal bladder neck and prostate. The trigone itself appeared to be unremarkable with bilateral ureteral orifices identified. The right UO did have a mildly scarlike appearance but no obvious disease involving the UO. Just beyond this overlying the intramural ureter, there was slightly raised, thickened urothelium which was mildly discolored but no clear obvious papillary change. This is the only focus of abnormality within the bladder.  This point in time, a 5 [place] open-ended ureteral catheter was inserted just within the left ureteral orifice and a gentle retrograde pyelogram was performed. This revealed no hydroureteronephrosis or filling defects. The ureter was delicate and the collecting system drained briskly.  Attention was then turned to the right ureteral orifice which was cannulated using the same technique. A gentle retrograde pyelogram on this side revealed very mild renal pelvic fullness with caliectasis and ureterectasis down to level of the UVJ. There is no obvious filling defects. No severe or overt hydronephrosis was appreciated. After several minutes, the ureter did drain but the upper tract drainage was somewhat sluggish especially compared to the left.  At this point in time, cold cup biopsy forceps were used to deeply sample the area of abnormality. Muscularis propria could be easily identified indicating adequate depth of biopsy. These were passed off the field as right bladder lesion. Judicious use of pinpoint Bovie electrocautery was used for adequate hemostasis. Finally, the decision was made to go ahead and place a right ureteral stent given the relatively sluggish drainage and ureterectasis on the right. The  ureter was cannulated  using a sensor wire up to level of the kidney.  A 6 x 26 French double-J ureteral stent was advanced over the wire up to level of the kidney. The wire was partially drawn until full coil was noted within the renal pelvis. The wire was then fully withdrawn and a full coil was noted within the bladder. The bladder was then drained. Hemostasis was excellent. The patient was then cleaned and dried, repositioned the supine position, reversed from anesthesia, taken to the PACU in stable condition.  Plan: I'll call the patient's daughter when I receive his pathology report.  We'll check a stat BUN/creatinine in the PACU. Of note, preop labs, his current and was noted to be rising. Her Pdet in the PACU showed that it had normalized back to his baseline and this is not affecting his overall renal function. We will decide on how to manage his ureteral stent pending his pathology report.  [name], M.D.

## 2016-12-07 NOTE — Anesthesia Preprocedure Evaluation (Signed)
Anesthesia Evaluation  Patient identified by MRN, date of birth, ID band Patient awake    Reviewed: Allergy & Precautions, NPO status , Patient's Chart, lab work & pertinent test results  History of Anesthesia Complications Negative for: history of anesthetic complications  Airway Mallampati: II  TM Distance: >3 FB Neck ROM: Full    Dental  (+) Upper Dentures, Lower Dentures   Pulmonary neg pulmonary ROS, neg sleep apnea, neg COPD,    breath sounds clear to auscultation- rhonchi (-) wheezing      Cardiovascular Exercise Tolerance: Good hypertension, + CAD (nonocclusive)  (-) Past MI, (-) Cardiac Stents and (-) CABG  Rhythm:Regular Rate:Normal - Systolic murmurs and - Diastolic murmurs    Neuro/Psych Dementia     GI/Hepatic Neg liver ROS, GERD  ,  Endo/Other  diabetes, Insulin Dependent  Renal/GU CRFRenal disease     Musculoskeletal negative musculoskeletal ROS (+)   Abdominal (+) - obese,   Peds  Hematology  (+) anemia ,   Anesthesia Other Findings Past Medical History: No date: Bladder cancer (HCC) No date: Chronic kidney disease No date: Coronary artery disease No date: Dementia No date: Diabetes ([place]) No date: Dysrhythmia No date: GERD (gastroesophageal reflux disease) 07/23/2014: Hematuria, gross No date: Hyperlipidemia No date: Hypertension No date: Pancreatic cancer (HCC)     Comment:  s/p whipple procedure   Reproductive/Obstetrics                             Anesthesia Physical Anesthesia Plan  ASA: III  Anesthesia Plan: General   Post-op Pain Management:    Induction: Intravenous  PONV Risk Score and Plan: 1 and Ondansetron and Dexamethasone  Airway Management Planned: LMA  Additional Equipment:   Intra-op Plan:   Post-operative Plan:   Informed Consent: I have reviewed the patients History and Physical, chart, labs and discussed the procedure including  the risks, benefits and alternatives for the proposed anesthesia with the patient or authorized representative who has indicated his/her understanding and acceptance.   Dental advisory given  Plan Discussed with: CRNA and Anesthesiologist  Anesthesia Plan Comments:         Anesthesia Quick Evaluation

## 2016-12-07 NOTE — Anesthesia Post-op Follow-up Note (Signed)
Anesthesia QCDR form completed.        

## 2016-12-07 NOTE — Anesthesia Procedure Notes (Signed)
Procedure Name: LMA Insertion Date/Time: 12/07/2016 7:50 AM Performed by: Doreen Salvage Pre-anesthesia Checklist: Patient identified, Patient being monitored, Timeout performed, Emergency Drugs available and Suction available Patient Re-evaluated:Patient Re-evaluated prior to induction Oxygen Delivery Method: Circle system utilized Preoxygenation: Pre-oxygenation with 100% oxygen Induction Type: IV induction Ventilation: Mask ventilation without difficulty LMA: LMA inserted LMA Size: 3.5 Tube type: Oral Number of attempts: 1 Placement Confirmation: positive ETCO2 and breath sounds checked- equal and bilateral Tube secured with: Tape Dental Injury: Teeth and Oropharynx as per pre-operative assessment

## 2016-12-07 NOTE — Anesthesia Postprocedure Evaluation (Signed)
Anesthesia Post Note  Patient: Colton Tyler  Procedure(s) Performed: CYSTOSCOPY WITH BLADDER BIOPSY (N/A Bladder) CYSTOSCOPY WITH RETROGRADE PYELOGRAM (Bilateral Ureter) CYSTOSCOPY WITH STENT PLACEMENT (Right Ureter)  Patient location during evaluation: PACU Anesthesia Type: General Level of consciousness: awake and alert Pain management: pain level controlled Vital Signs Assessment: post-procedure vital signs reviewed and stable Respiratory status: spontaneous breathing, nonlabored ventilation and respiratory function stable Cardiovascular status: blood pressure returned to baseline and stable Postop Assessment: no signs of nausea or vomiting Anesthetic complications: no     Last Vitals:  Vitals:   12/07/16 0902 12/07/16 0917  BP:  (!) 161/63  Pulse: (!) 34 (!) 56  Resp: (!) 23 (!) 27  Temp: (!) 36.3 C   SpO2: 98% 99%    Last Pain:  Vitals:   12/07/16 0902  TempSrc:   PainSc: 0-No pain                 Colton Tyler

## 2016-12-07 NOTE — Transfer of Care (Signed)
Immediate Anesthesia Transfer of Care Note  Patient: Colton Tyler  Procedure(s) Performed: Procedure(s): CYSTOSCOPY WITH BLADDER BIOPSY (N/A) CYSTOSCOPY WITH RETROGRADE PYELOGRAM (Bilateral) CYSTOSCOPY WITH STENT PLACEMENT (Right)  Patient Location: PACU  Anesthesia Type:General  Level of Consciousness: sedated  Airway & Oxygen Therapy: Patient Spontanous Breathing and Patient connected to face mask oxygen  Post-op Assessment: Report given to RN and Post -op Vital signs reviewed and stable  Post vital signs: Reviewed and stable  Last Vitals:  Vitals:   12/07/16 0618 12/07/16 0832  BP: (!) 151/85 (!) 158/67  Pulse: 60 (!) 46  Resp: 17 12  Temp: (!) 36.3 C (!) 36.2 C  SpO2: 03% 888%    Complications: No apparent anesthesia complications

## 2016-12-07 NOTE — H&P (View-Only) (Signed)
   11/30/16  CC:  Chief Complaint  Patient presents with  . 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  Underwent repeat TURBT 10/2015 for 2 cm area of patchy erythema involving the right lateral bladder wall beyond the right UO, heavytrabeculation, no obvious papillary lesions. 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 The suspicious area was fulgurated completely. Due to his age and frailty he declined re-TUR and cystectomy. BCG induction was started.   Last staging: CT May 2018. There was as suspicious are on liver, but that was benign on MRI Liver  Last BCG: May 2018 x 3; Nov 2017 x 6  Last cysto: May 2018 benign   He's doing well. No gross hematuria. UA clear.    There were no vitals taken for this visit. NED. A&Ox3.   No respiratory distress   Abd soft, NT, ND Normal phallus with bilateral descended testicles  Cystoscopy Procedure Note  Patient identification was confirmed, informed consent was obtained, and patient was prepped using Betadine solution.  Lidocaine jelly was administered per urethral meatus.    Preoperative abx where received prior to procedure.     Pre-Procedure: - Inspection reveals a normal caliber ureteral meatus.  Procedure: The flexible cystoscope was introduced without difficulty - No urethral strictures/lesions are present. - prostate normal  - bladder neck normal  - Bilateral ureteral orifices identified - Bladder mucosa a raised/thickended possible early papillary area just later to Right UO and possibly over the intramural ureter concerning for recurrence.  - No bladder stones - No trabeculation  Retroflexion shows normal bladder neck, same findings as above around the right UO.    Post-Procedure: - Patient tolerated the procedure  well  Assessment/ Plan:  Bladder ca - History of high-grade T2 disease with a high-grade T1 recurrence lateral to the right UO. Recent CT staging was negative, but cystoscopy today shows possible early recurrence. I discussed with pt and his daughter the nature risks benefits and alternatives to cysto bbx/possible TURBT possible ureteral stent placement and retrograde required. All questions answered and elected to proceed. This area might best be viewed in the OR with a 70 lens.

## 2016-12-07 NOTE — Discharge Instructions (Signed)
You have a ureteral stent in place.  This is a tube that extends from your kidney to your bladder.  This may cause urinary bleeding, burning with urination, and urinary frequency.  Please call our office or present to the ED if you develop fevers >101 or pain which is not able to be controlled with oral pain medications.  You may be given either Flomax and/ or ditropan to help with bladder spasms and stent pain in addition to pain medications.   ° °[facility] °[address], [address] °[place], [place] [address] °[phone] ° ° ° °AMBULATORY SURGERY  °DISCHARGE INSTRUCTIONS ° ° °1) The drugs that you were given will stay in your system until tomorrow so for the next 24 hours you should not: ° °A) Drive an automobile °B) Make any legal decisions °C) Drink any alcoholic beverage ° ° °2) You may resume regular meals tomorrow.  Today it is better to start with liquids and gradually work up to solid foods. ° °You may eat anything you prefer, but it is better to start with liquids, then soup and crackers, and gradually work up to solid foods. ° ° °3) Please notify your doctor immediately if you have any unusual bleeding, trouble breathing, redness and pain at the surgery site, drainage, fever, or pain not relieved by medication. ° ° ° °4) Additional Instructions: ° ° ° ° ° ° ° °Please contact your physician with any problems or Same Day Surgery at [phone], Monday through Friday 6 am to 4 pm, or [facility] Main number at [phone]. °

## 2016-12-07 NOTE — Interval H&P Note (Signed)
History and Physical Interval Note:  12/07/2016 7:26 AM  Colton Tyler  has presented today for surgery, with the diagnosis of Bladder cancer - lateral  The various methods of treatment have been discussed with the patient and family. After consideration of risks, benefits and other options for treatment, the patient has consented to  Procedure(s): CYSTOSCOPY WITH BLADDER BIOPSY (N/A) CYSTOSCOPY WITH RETROGRADE PYELOGRAM (Bilateral) CYSTOSCOPY WITH STENT PLACEMENT (Bilateral) TRANSURETHRAL RESECTION OF BLADDER TUMOR (TURBT) (N/A) as a surgical intervention .  The patient's history has been reviewed, patient examined, no change in status, stable for surgery.  I have reviewed the patient's chart and labs.  Questions were answered to the patient's satisfaction.    RRR CTAB  [name]

## 2016-12-08 ENCOUNTER — Encounter: Payer: Self-pay | Admitting: Urology

## 2016-12-08 LAB — SURGICAL PATHOLOGY

## 2016-12-10 ENCOUNTER — Telehealth: Payer: Self-pay | Admitting: Urology

## 2016-12-10 NOTE — Telephone Encounter (Signed)
Called patient's daughter, Vivien Rossetti, to discuss surgical pathology results. Surgical pathology consistent with high-grade T1 TCC recurrence.  Labs checked immediately and PACU revealed that his creatinine had trended back to baseline. At this point in time, there does not appear to be obstruction from the tumor therefore will remove ureteral stent up follow-up scheduled for the ninth.  Options and further management of cancer also discussed. This point time, options are somewhat limited given his age, comorbidities, and previous treatments. He/ she would be interested in another induction course of BCG x 6 but beyond that, would prefer to remain conservative. They would also be agreeable to returning to the operating room to resect any gross recurrence in the future.   [name], MD

## 2016-12-15 ENCOUNTER — Encounter: Payer: Self-pay | Admitting: Urology

## 2016-12-15 ENCOUNTER — Ambulatory Visit (INDEPENDENT_AMBULATORY_CARE_PROVIDER_SITE_OTHER): Payer: Medicare Other | Admitting: Urology

## 2016-12-15 VITALS — BP 176/79 | HR 55 | Ht 66.0 in | Wt 131.6 lb

## 2016-12-15 DIAGNOSIS — C672 Malignant neoplasm of lateral wall of bladder: Secondary | ICD-10-CM | POA: Diagnosis not present

## 2016-12-15 MED ORDER — CIPROFLOXACIN HCL 500 MG PO TABS: 500.0000 mg | ORAL_TABLET | Freq: Once | ORAL | Status: DC

## 2016-12-15 MED ORDER — LIDOCAINE HCL 2 % EX GEL: 1.0000 "application " | Freq: Once | CUTANEOUS | Status: DC

## 2016-12-15 NOTE — Progress Notes (Signed)
   12/15/16  CC:  Chief Complaint  Patient presents with  . Cysto Stent Removal    HPI: 83 year old male with a history of muscle invasive bladder cancer status post chemotherapy and 11/2013 with recurrence of HgT1/ CIS in 10/2015 s/p BCG.  Recently, he had an abnormal lesion just beyond the right UO and underwent TURBT. This was also consistent with a recurrence of high-grade T1 disease.  She returns to the office today for cystoscopy, stent removal. As part of preop workup, labs indicated his current was somewhat elevated from baseline. Intraoperatively, B-cell location of the tumor, stent was placed as a precaution. In the recovery area, repeat labs were drawn his creatinine had returned to baseline.   Blood pressure (!) 176/79, pulse (!) 55, height 5\' 6"  (1.676 m), weight 131 lb 9.6 oz (59.7 kg). NED. A&Ox3.   No respiratory distress   Abd soft, NT, ND Normal phallus with bilateral descended testicles  Cystoscopy/ Stent removal procedure  Patient identification was confirmed, informed consent was obtained, and patient was prepped using Betadine solution.  Lidocaine jelly was administered per urethral meatus.    Preoperative abx where received prior to procedure.    Procedure: - Flexible cystoscope introduced, without any difficulty.   - Thorough search of the bladder revealed:    normal urethral meatus  Stent seen emanating from right ureteral orifice, grasped with stent graspers, and removed in entirety.    Post-Procedure: - Patient tolerated the procedure well   Assessment/ Plan:  1. Malignant neoplasm of lateral wall of urinary bladder (HCC) Stent removed today PSA discussed plan with daughter which was reviewed again today. Given his age and comorbidities, recommend continued surveillance, resection for any obvious recurrence, and repeat induction course of BCG. - Urinalysis, Complete - ciprofloxacin (CIPRO) tablet 500 mg; Take 1 tablet (500 mg total) by mouth  once. - lidocaine (XYLOCAINE) 2 % jelly 1 application; Place 1 application into the urethra once.  Return for bcg x 6.   [name], MD

## 2016-12-16 LAB — URINALYSIS, COMPLETE
Bilirubin, UA: NEGATIVE
GLUCOSE, UA: NEGATIVE
Ketones, UA: NEGATIVE
Nitrite, UA: NEGATIVE
Specific Gravity, UA: 1.01 (ref 1.005–1.030)
UUROB: 0.2 mg/dL (ref 0.2–1.0)
pH, UA: 5.5 (ref 5.0–7.5)

## 2016-12-16 LAB — MICROSCOPIC EXAMINATION: Epithelial Cells (non renal): NONE SEEN /hpf (ref 0–10)

## 2017-01-04 NOTE — Progress Notes (Addendum)
01/05/2017 4:44 PM   Colton Tyler 1925-04-27 [id number]  Referring provider: Kirk Ruths, MD [facility] Colton Tyler, Colton Tyler Colton Tyler  Chief Complaint  Patient presents with  . Other    BCG # 1    HPI: Patient is an 83 year old Caucasian male who presents today to begin an induction series of BCG. This will be his first of 6.  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    He returned to the office on 10/07/2015 at which time there are multiple lesions within the bladder concerning for carcinoma in situ with positive urine cytology.    After a recent return to the operating room for a TURBT of a recurrent lesion on 11/05/15 post operatively, he was noted to have an at least 2 cm area of patchy erythema involving the right lateral bladder wall beyond the right UO couldn't suspicious for CIS. His bladder was heavily trabeculated. There are no obvious papillary lesions.  Surgical pathology was consistent with urothelial carcinoma in situ as well as high-grade invasive disease into the lamina propria. No muscularis propria was present on the biopsies.  The suspicious area was fulgurated completely.  Most recent imaging in the form of CT urogram on 10/18/2015 shows no clear evidence of metastatic disease. He does have a small liver lesion which is somewhat atypical for metastatic disease.  Completed induction series of BCG in November 2017.    Surveillance cystoscopy completed on 04/14/2016 with Dr. Junious Tyler noted NED.    Completed maintenance series of 3 BCG's in May 2018.  Surveillance cystoscopy completed on May 2018 with Dr. Junious Tyler noted NED.     CT May 2018. There was as suspicious are on liver, but that was benign on MRI Liver   Surveillance cystoscopic completed on 11/30/2016 noted possible  recurrence near the right UO.  Underwent TURBT on 12/07/2016 with Dr. Erlene Tyler. Pathology consistent with a recurrence of high-grade T1 disease.    Today, he is not having any urinary complaints. He has not had gross hematuria, dysuria or suprapubic pain. He has not had fevers, chills, nausea or vomiting.  He has not had a cough. His UA today is negative.   PMH: Past Medical History:  Diagnosis Date  . Bladder cancer ([place])   . Chronic kidney disease   . Coronary artery disease   . Dementia   . Diabetes ([place])   . Dysrhythmia   . GERD (gastroesophageal reflux disease)   . Hematuria, gross 07/23/2014  . Hyperlipidemia   . Hypertension   . Pancreatic cancer [facility])    s/p whipple procedure    Surgical History: Past Surgical History:  Procedure Laterality Date  . BLADDER SURGERY    . COLONOSCOPY WITH PROPOFOL N/A 07/31/2016   Procedure: COLONOSCOPY WITH PROPOFOL;  Surgeon: Colton Lame, MD;  Location[facility] ENDOSCOPY;  Service: Endoscopy;  Laterality: N/A;  . CYSTOSCOPY W/ RETROGRADES Bilateral 12/07/2016   Procedure: CYSTOSCOPY WITH RETROGRADE PYELOGRAM;  Surgeon: Colton Espy, MD;  Location: [facility] ORS;  Service: Urology;  Laterality: Bilateral;  . CYSTOSCOPY WITH BIOPSY N/A 11/05/2015   Procedure: CYSTOSCOPY WITH BIOPSY;  Surgeon: Colton Espy, MD;  Location: [facility] ORS;  Service: Urology;  Laterality: N/A;  . CYSTOSCOPY WITH BIOPSY N/A 12/07/2016   Procedure: CYSTOSCOPY WITH BLADDER BIOPSY;  Surgeon: Colton Espy, MD;  Location: [facility] ORS;  Service: Urology;  Laterality: N/A;  . CYSTOSCOPY WITH FULGERATION  11/05/2015   Procedure: CYSTOSCOPY WITH FULGERATION;  Surgeon: Colton Espy, MD;  Location: [facility] ORS;  Service: Urology;;  . Colton Tyler WITH STENT PLACEMENT Right 12/07/2016   Procedure: CYSTOSCOPY WITH STENT PLACEMENT;  Surgeon: Colton Espy, MD;  Location: [facility] ORS;  Service: Urology;  Laterality: Right;  . ESOPHAGOGASTRODUODENOSCOPY (EGD) WITH PROPOFOL N/A 07/31/2016    Procedure: ESOPHAGOGASTRODUODENOSCOPY (EGD) WITH PROPOFOL;  Surgeon: Colton Lame, MD;  Location: [facility] ENDOSCOPY;  Service: Endoscopy;  Laterality: N/A;  . HERNIA REPAIR Left    Inguinal Hernia Repair  . PORTACATH PLACEMENT Right 2015   [facility]  . WHIPPLE PROCEDURE    . WHIPPLE PROCEDURE      Home Medications:  Allergies as of 01/05/2017      Reactions   Penicillins Other (See Comments)   "unknown"      Medication List        Accurate as of 01/05/17 11:59 PM. Always use your most recent med list.          ferrous sulfate 325 (65 FE) MG EC tablet Take 325 mg by mouth daily with breakfast.   JANUVIA 50 MG tablet Generic drug:  sitaGLIPtin TAKE 1 TABLET BY MOUTH EVERY DAY AT NOON   LANTUS SOLOSTAR 100 UNIT/ML Solostar Pen Generic drug:  Insulin Glargine Inject 20 Units every evening into the skin.   NAMENDA 10 MG tablet Generic drug:  memantine Take 10 mg by mouth 2 (two) times daily.   simvastatin 20 MG tablet Commonly known as:  ZOCOR Take 20 mg by mouth at bedtime.       Allergies:  Allergies  Allergen Reactions  . Penicillins Other (See Comments)    "unknown"    Family History: Family History  Problem Relation Age of Onset  . Cerebrovascular Accident Father   . Stomach cancer Sister   . Kidney disease Neg Hx   . Prostate cancer Neg Hx   . Kidney cancer Neg Hx   . Bladder Cancer Neg Hx     Social History:  reports that  has never smoked. he has never used smokeless tobacco. He reports that he does not drink alcohol or use drugs.  ROS: UROLOGY Frequent Urination?: No Hard to postpone urination?: No Burning/pain with urination?: No Get up at night to urinate?: No Leakage of urine?: No Urine stream starts and stops?: No Trouble starting stream?: No Do you have to strain to urinate?: No Blood in urine?: No Urinary tract infection?: No Sexually transmitted disease?: No Injury to kidneys or bladder?: No Painful intercourse?: No Weak stream?:  No Erection problems?: No Penile pain?: No  Gastrointestinal Nausea?: No Vomiting?: No Indigestion/heartburn?: No Diarrhea?: No Constipation?: No  Constitutional Fever: No Night sweats?: No Weight loss?: No Fatigue?: No  Skin Skin rash/lesions?: No Itching?: No  Eyes Blurred vision?: No Double vision?: No  Ears/Nose/Throat Sore throat?: No Sinus problems?: No  Hematologic/Lymphatic Swollen glands?: No Easy bruising?: No  Cardiovascular Leg swelling?: No Chest pain?: No  Respiratory Cough?: No Shortness of breath?: No  Endocrine Excessive thirst?: No  Musculoskeletal Back pain?: No Joint pain?: No  Neurological Headaches?: No Dizziness?: No  Psychologic Depression?: No Anxiety?: No  Physical Exam: BP (!) 182/115   Pulse 81   Ht 5\' 6"  (1.676 m)   Wt 132 lb (59.9 kg)   BMI 21.31 kg/m   Constitutional: Well nourished. Alert and oriented, No acute distress. HEENT: [place] AT, moist mucus membranes. Trachea midline, no masses. Cardiovascular:  No clubbing, cyanosis, or edema. Respiratory: Normal respiratory effort, no increased work of breathing. GI: Abdomen is soft, non tender, non distended, no abdominal masses. Liver and spleen not palpable.  No hernias appreciated.  Stool sample for occult testing is not indicated.   GU: No CVA tenderness.  No bladder fullness or masses.  Patient with circumcised phallus.   Urethral meatus is patent.  No penile discharge. No penile lesions or rashes.  Skin: No rashes, bruises or suspicious lesions. Lymph: No cervical or inguinal adenopathy. Neurologic: Grossly intact, no focal deficits, moving all 4 extremities. Psychiatric: Normal mood and affect.  Laboratory Data: Lab Results  Component Value Date   WBC 4.4 01/23/2017   HGB 9.5 (L) 01/23/2017   HCT 29.8 (L) 01/23/2017   MCV 90.4 01/23/2017   PLT 99 (L) 01/23/2017    Lab Results  Component Value Date   CREATININE 1.32 (H) 01/23/2017    Lab Results   Component Value Date   HGBA1C 7.2 (H) 07/30/2016    Lab Results  Component Value Date   TSH 2.926 01/16/2016    Lab Results  Component Value Date   AST 42 (H) 01/21/2017   Lab Results  Component Value Date   ALT 30 01/21/2017    Urinalysis Negative.  See EPIC.    I have reviewed the labs.   Assessment & Plan:    1. 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  - repeat TURBT 10/2015 for 2 cm area of patchy erythema involving the right lateral bladder wall beyond the right UO, heavytrabeculation, no obvious papillary lesions. 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  - repeat TURBT 12/2016 for a raised/thickened possible early papillary area just later to Right UO and possibly over the intramural ureter concerning for recurrence.  Surgical pathology = consistent with high-grade T1 TCC recurrence  - Reviewed BCG treatment course, possible side effects including BCG sepsis, bladder irritation, worsening of her urinary symptoms  - # 1 of 6 BCG installed today  - Patient was instructed to pour bleach down his/her toilet for the next 6 hours  -  Instructed to call the office if she should experience fevers greater than 102, chills/rigors, onset of a new cough, night sweats or further bladder spasms or inability to urinate   - RTC in one week for # 2 BCG  - Surveillance protocol also discussed today including cystoscopy every 3 months for at least 2 years and then spread out thereafter  - Urinalysis, Complete  - bcg vaccine injection 81 mg; Instill 3.24 mLs (81 mg total) into the bladder once.  - lidocaine (XYLOCAINE) 2 % jelly 1 application; Place 1 application into the urethra once.   Return in about 1 week (around 01/12/2017) for # 2 BCG.  These notes generated with voice  recognition software. I apologize for typographical errors.  Zara Council, [facility] [address] [place], [place] [address] [phone]

## 2017-01-05 ENCOUNTER — Ambulatory Visit (INDEPENDENT_AMBULATORY_CARE_PROVIDER_SITE_OTHER): Payer: Medicare Other | Admitting: Urology

## 2017-01-05 ENCOUNTER — Encounter: Payer: Self-pay | Admitting: Urology

## 2017-01-05 VITALS — BP 182/115 | HR 81 | Ht 66.0 in | Wt 132.0 lb

## 2017-01-05 DIAGNOSIS — C672 Malignant neoplasm of lateral wall of bladder: Secondary | ICD-10-CM | POA: Diagnosis not present

## 2017-01-05 LAB — URINALYSIS, COMPLETE
Bilirubin, UA: NEGATIVE
GLUCOSE, UA: NEGATIVE
Ketones, UA: NEGATIVE
Nitrite, UA: NEGATIVE
PH UA: 5.5 (ref 5.0–7.5)
Specific Gravity, UA: 1.015 (ref 1.005–1.030)
Urobilinogen, Ur: 0.2 mg/dL (ref 0.2–1.0)

## 2017-01-05 LAB — MICROSCOPIC EXAMINATION
BACTERIA UA: NONE SEEN
Epithelial Cells (non renal): NONE SEEN /hpf (ref 0–10)
RBC MICROSCOPIC, UA: NONE SEEN /HPF (ref 0–?)

## 2017-01-05 MED ORDER — BCG LIVE 50 MG IS SUSR
3.2400 mL | Freq: Once | INTRAVESICAL | Status: AC
  Administered 2017-01-05: 81 mg via INTRAVESICAL

## 2017-01-05 NOTE — Addendum Note (Signed)
Addended by: Lestine Box on: 01/05/2017 04:23 PM   Modules accepted: Orders

## 2017-01-05 NOTE — Progress Notes (Signed)
BCG Bladder Instillation  BCG # 1  Due to Bladder Cancer patient is present today for a BCG treatment. Patient was cleaned and prepped in a sterile fashion with betadine and lidocaine 2% jelly was instilled into the urethra.  A 14FR catheter was inserted, urine return was noted 48ml, urine was yellow in color.  75ml of reconstituted BCG was instilled into the bladder. The catheter was then removed. Patient tolerated well, no complications were noted  Preformed by: Toniann Fail, LPN

## 2017-01-11 NOTE — Progress Notes (Signed)
01/12/2017 4:20 PM   Colton Tyler 1926-02-13 [id number]  Referring provider: Kirk Ruths, MD [name] [place], [place] [address]  Chief Complaint  Patient presents with  . Bladder Cancer    HPI: Patient is an 83 year old Caucasian male who presents today to begin an induction series of BCG. This will be his 2nd of 6.  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    He returned to the office on 10/07/2015 at which time there are multiple lesions within the bladder concerning for carcinoma in situ with positive urine cytology.    After a recent return to the operating room for a TURBT of a recurrent lesion on 11/05/15 post operatively, he was noted to have an at least 2 cm area of patchy erythema involving the right lateral bladder wall beyond the right UO couldn't suspicious for CIS. His bladder was heavily trabeculated. There are no obvious papillary lesions.  Surgical pathology was consistent with urothelial carcinoma in situ as well as high-grade invasive disease into the lamina propria. No muscularis propria was present on the biopsies.  The suspicious area was fulgurated completely.  Most recent imaging in the form of CT urogram on 10/18/2015 shows no clear evidence of metastatic disease. He does have a small liver lesion which is somewhat atypical for metastatic disease.  Completed induction series of BCG in November 2017.    Surveillance cystoscopy completed on 04/14/2016 with [name] noted NED.    Completed maintenance series of 3 BCG's in May 2018.  Surveillance cystoscopy completed on May 2018 with [name] noted NED.     CT May 2018. There was as suspicious are on liver, but that was benign on MRI Liver   Surveillance cystoscopy completed on 11/30/2016 noted possible recurrence  near the right UO.  Underwent TURBT on 12/07/2016 with [name]. Pathology consistent with a recurrence of high-grade T1 disease.    Today, he is not having any urinary complaints. He has not had gross hematuria, dysuria or suprapubic pain. He has not had fevers, chills, nausea or vomiting.  He has not had a cough. His UA today contains 11-30 WBC's.  He was able to hold his bladder instillation for two hours.     PMH: Past Medical History:  Diagnosis Date  . Bladder cancer ([name])   . Chronic kidney disease   . Coronary artery disease   . Dementia   . Diabetes ([place])   . Dysrhythmia   . GERD (gastroesophageal reflux disease)   . Hematuria, gross 07/23/2014  . Hyperlipidemia   . Hypertension   . Pancreatic cancer [facility])    s/p whipple procedure    Surgical History: Past Surgical History:  Procedure Laterality Date  . BLADDER SURGERY    . HERNIA REPAIR Left    Inguinal Hernia Repair  . PORTACATH PLACEMENT Right 2015   [facility]  . WHIPPLE PROCEDURE    . WHIPPLE PROCEDURE      Home Medications:  Allergies as of 01/12/2017      Reactions   Penicillins Other (See Comments)   "unknown"      Medication List        Accurate as of 01/12/17  4:20 PM. Always use your most recent med list.          ferrous sulfate 325 (65 FE) MG EC  tablet Take 325 mg by mouth daily with breakfast.   JANUVIA 50 MG tablet Generic drug:  sitaGLIPtin TAKE 1 TABLET BY MOUTH EVERY DAY AT NOON   LANTUS SOLOSTAR 100 UNIT/ML Solostar Pen Generic drug:  Insulin Glargine Inject 20 Units into the skin every evening. At 1830   NAMENDA 10 MG tablet Generic drug:  memantine Take 10 mg by mouth 2 (two) times daily.   simvastatin 20 MG tablet Commonly known as:  ZOCOR Take 20 mg by mouth at bedtime.       Allergies:  Allergies  Allergen Reactions  . Penicillins Other (See Comments)    "unknown"    Family History: Family History  Problem Relation Age of Onset  . Cerebrovascular Accident  Father   . Stomach cancer Sister   . Kidney disease Neg Hx   . Prostate cancer Neg Hx   . Kidney cancer Neg Hx   . Bladder Cancer Neg Hx     Social History:  reports that  has never smoked. he has never used smokeless tobacco. He reports that he does not drink alcohol or use drugs.  ROS: UROLOGY Frequent Urination?: No Hard to postpone urination?: No Burning/pain with urination?: No Get up at night to urinate?: No Leakage of urine?: No Urine stream starts and stops?: No Trouble starting stream?: No Do you have to strain to urinate?: No Blood in urine?: No Urinary tract infection?: No Sexually transmitted disease?: No Injury to kidneys or bladder?: No Painful intercourse?: No Weak stream?: No Erection problems?: No Penile pain?: No  Gastrointestinal Nausea?: No Vomiting?: No Indigestion/heartburn?: No Diarrhea?: No Constipation?: No  Constitutional Fever: No Night sweats?: No Weight loss?: No Fatigue?: No  Skin Skin rash/lesions?: No Itching?: No  Eyes Blurred vision?: No Double vision?: No  Ears/Nose/Throat Sore throat?: No Sinus problems?: No  Hematologic/Lymphatic Swollen glands?: No Easy bruising?: No  Cardiovascular Leg swelling?: No Chest pain?: No  Respiratory Cough?: No Shortness of breath?: No  Endocrine Excessive thirst?: No  Musculoskeletal Back pain?: No Joint pain?: No  Neurological Headaches?: No Dizziness?: No  Psychologic Depression?: No Anxiety?: No  Physical Exam: BP (!) 156/80 (BP Location: Right Arm, Patient Position: Sitting, Cuff Size: Normal)   Pulse 61   Ht 5\' 6"  (1.676 m)   Wt 134 lb 8 oz (61 kg)   BMI 21.71 kg/m   Constitutional: Well nourished. Alert and oriented, No acute distress. HEENT: [place] AT, moist mucus membranes. Trachea midline, no masses. Cardiovascular: No clubbing, cyanosis, or edema. Respiratory: Normal respiratory effort, no increased work of breathing. GI: Abdomen is soft, non tender,  non distended, no abdominal masses. Liver and spleen not palpable.  No hernias appreciated.  Stool sample for occult testing is not indicated.   GU: No CVA tenderness.  No bladder fullness or masses.  Patient with circumcised phallus.   Urethral meatus is patent.  No penile discharge. No penile lesions or rashes.  Skin: No rashes, bruises or suspicious lesions. Lymph: No cervical or inguinal adenopathy. Neurologic: Grossly intact, no focal deficits, moving all 4 extremities. Psychiatric: Normal mood and affect.  Laboratory Data: Lab Results  Component Value Date   WBC 4.2 12/02/2016   HGB 10.2 (L) 12/02/2016   HCT 32.2 (L) 12/02/2016   MCV 86.9 12/02/2016   PLT 123 (L) 12/02/2016    Lab Results  Component Value Date   CREATININE 1.55 (H) 12/07/2016    Lab Results  Component Value Date   HGBA1C 7.2 (H) 07/30/2016  Lab Results  Component Value Date   TSH 2.926 01/16/2016    Lab Results  Component Value Date   AST 23 07/30/2016   Lab Results  Component Value Date   ALT 16 (L) 07/30/2016    Urinalysis 11-30 WBC's.  See EPIC.    I have reviewed the labs.   Assessment & Plan:    1. 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  - repeat TURBT 10/2015 for 2 cm area of patchy erythema involving the right lateral bladder wall beyond the right UO, heavytrabeculation, no obvious papillary lesions. 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  - repeat TURBT 12/2016 for a raised/thickened possible early papillary area just later to Right UO and possibly over the intramural ureter concerning for recurrence.  Surgical pathology = consistent with high-grade T1 TCC recurrence  - Reviewed BCG treatment course, possible side effects including BCG sepsis, bladder irritation, worsening of  her urinary symptoms  - # 2 of 6 BCG installed today  - Patient was instructed to pour bleach down his/her toilet for the next 6 hours  -  Instructed to call the office if she should experience fevers greater than 102, chills/rigors, onset of a new cough, night sweats or further bladder spasms or inability to urinate   - RTC in one week for # 3 BCG  - Surveillance protocol also discussed today including cystoscopy every 3 months for at least 2 years and then spread out thereafter  - Urinalysis, Complete  - bcg vaccine injection 81 mg; Instill 3.24 mLs (81 mg total) into the bladder once.  - lidocaine (XYLOCAINE) 2 % jelly 1 application; Place 1 application into the urethra once.   Return in about 1 week (around 01/19/2017) for # 3 BCG.  These notes generated with voice recognition software. I apologize for typographical errors.  Zara Council, [facility] [address] [place], [place] [address] [phone]

## 2017-01-12 ENCOUNTER — Encounter: Payer: Self-pay | Admitting: Urology

## 2017-01-12 ENCOUNTER — Ambulatory Visit (INDEPENDENT_AMBULATORY_CARE_PROVIDER_SITE_OTHER): Payer: Medicare Other | Admitting: Urology

## 2017-01-12 VITALS — BP 156/80 | HR 61 | Ht 66.0 in | Wt 134.5 lb

## 2017-01-12 DIAGNOSIS — C672 Malignant neoplasm of lateral wall of bladder: Secondary | ICD-10-CM | POA: Diagnosis not present

## 2017-01-12 LAB — MICROSCOPIC EXAMINATION
Bacteria, UA: NONE SEEN
Epithelial Cells (non renal): NONE SEEN /hpf (ref 0–10)
RBC, UA: NONE SEEN /hpf (ref 0–?)

## 2017-01-12 LAB — URINALYSIS, COMPLETE
Bilirubin, UA: NEGATIVE
GLUCOSE, UA: NEGATIVE
Ketones, UA: NEGATIVE
Nitrite, UA: NEGATIVE
RBC UA: NEGATIVE
Specific Gravity, UA: 1.01 (ref 1.005–1.030)
Urobilinogen, Ur: 0.2 mg/dL (ref 0.2–1.0)
pH, UA: 5.5 (ref 5.0–7.5)

## 2017-01-12 MED ORDER — BCG LIVE 50 MG IS SUSR
3.2400 mL | Freq: Once | INTRAVESICAL | Status: AC
  Administered 2017-01-12: 81 mg via INTRAVESICAL

## 2017-01-12 NOTE — Progress Notes (Signed)
BCG Bladder Instillation  BCG # 2   Due to Bladder Cancer patient is present today for a BCG treatment. Patient was cleaned and prepped in a sterile fashion with betadine and lidocaine 2% jelly was instilled into the urethra.  A 14FR catheter was inserted, urine return was noted  70ml, urine was yellow in color.  35ml of reconstituted BCG was instilled into the bladder. The catheter was then removed. Patient tolerated well, no complications were noted  Preformed by: Zara Council, PA-C, Elberta Leatherwood, CMA  Follow up/ Additional notes: 1 week #2

## 2017-01-18 NOTE — Progress Notes (Addendum)
01/19/2017 4:33 PM   Colton Tyler 1925/12/23 [id number]  Referring provider: Kirk Ruths, MD [facility] [place], [place] [address]  Chief Complaint  Patient presents with  . BCG Treatment    HPI: Patient is an 83 year old Caucasian male who presents today to begin an induction series of BCG. This will be his 3rd of 6.  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    He returned to the office on 10/07/2015 at which time there are multiple lesions within the bladder concerning for carcinoma in situ with positive urine cytology.    After a recent return to the operating room for a TURBT of a recurrent lesion on 11/05/15 post operatively, he was noted to have an at least 2 cm area of patchy erythema involving the right lateral bladder wall beyond the right UO couldn't suspicious for CIS. His bladder was heavily trabeculated. There are no obvious papillary lesions.  Surgical pathology was consistent with urothelial carcinoma in situ as well as high-grade invasive disease into the lamina propria. No muscularis propria was present on the biopsies.  The suspicious area was fulgurated completely.  Most recent imaging in the form of CT urogram on 10/18/2015 shows no clear evidence of metastatic disease. He does have a small liver lesion which is somewhat atypical for metastatic disease.  Completed induction series of BCG in November 2017.    Surveillance cystoscopy completed on 04/14/2016 with [name] noted NED.    Completed maintenance series of 3 BCG's in May 2018.  Surveillance cystoscopy completed on May 2018 with [name] noted NED.     CT May 2018. There was as suspicious are on liver, but that was benign on MRI Liver   Surveillance cystoscopy completed on 11/30/2016 noted possible recurrence near  the right UO.  Underwent TURBT on 12/07/2016 with [name]. Pathology consistent with a recurrence of high-grade T1 disease.    Today, he is not having any urinary complaints. He has not had gross hematuria, dysuria or suprapubic pain. He has not had fevers, chills, nausea or vomiting.  He has not had a cough. His UA today contains 11-30 WBC's.  He was able to hold his bladder instillation for two hours.     PMH: Past Medical History:  Diagnosis Date  . Bladder cancer ([place])   . Chronic kidney disease   . Coronary artery disease   . Dementia   . Diabetes ([place])   . Dysrhythmia   . GERD (gastroesophageal reflux disease)   . Hematuria, gross 07/23/2014  . Hyperlipidemia   . Hypertension   . Pancreatic cancer [facility])    s/p whipple procedure    Surgical History: Past Surgical History:  Procedure Laterality Date  . BLADDER SURGERY    . HERNIA REPAIR Left    Inguinal Hernia Repair  . PORTACATH PLACEMENT Right 2015   [facility]  . WHIPPLE PROCEDURE    . WHIPPLE PROCEDURE      Home Medications:  Allergies as of 01/19/2017      Reactions   Penicillins Other (See Comments)   "unknown"      Medication List        Accurate as of 01/19/17  4:33 PM. Always use your most recent med list.          ferrous sulfate 325 (65 FE) MG EC  tablet Take 325 mg by mouth daily with breakfast.   JANUVIA 50 MG tablet Generic drug:  sitaGLIPtin TAKE 1 TABLET BY MOUTH EVERY DAY AT NOON   LANTUS SOLOSTAR 100 UNIT/ML Solostar Pen Generic drug:  Insulin Glargine Inject 20 Units into the skin every evening. At 1830   NAMENDA 10 MG tablet Generic drug:  memantine Take 10 mg by mouth 2 (two) times daily.   simvastatin 20 MG tablet Commonly known as:  ZOCOR Take 20 mg by mouth at bedtime.       Allergies:  Allergies  Allergen Reactions  . Penicillins Other (See Comments)    "unknown"    Family History: Family History  Problem Relation Age of Onset  . Cerebrovascular Accident  Father   . Stomach cancer Sister   . Kidney disease Neg Hx   . Prostate cancer Neg Hx   . Kidney cancer Neg Hx   . Bladder Cancer Neg Hx     Social History:  reports that  has never smoked. he has never used smokeless tobacco. He reports that he does not drink alcohol or use drugs.  ROS: UROLOGY Frequent Urination?: No Hard to postpone urination?: No Burning/pain with urination?: No Get up at night to urinate?: No Leakage of urine?: No Urine stream starts and stops?: No Trouble starting stream?: No Do you have to strain to urinate?: No Blood in urine?: No Urinary tract infection?: No Sexually transmitted disease?: No Injury to kidneys or bladder?: No Painful intercourse?: No Weak stream?: No Erection problems?: No Penile pain?: No  Gastrointestinal Nausea?: No Vomiting?: No Indigestion/heartburn?: No Diarrhea?: No Constipation?: No  Constitutional Fever: No Night sweats?: No Weight loss?: No Fatigue?: No  Skin Skin rash/lesions?: No Itching?: No  Eyes Blurred vision?: No Double vision?: No  Ears/Nose/Throat Sore throat?: No Sinus problems?: No  Hematologic/Lymphatic Swollen glands?: No Easy bruising?: No  Cardiovascular Leg swelling?: No Chest pain?: No  Respiratory Cough?: No Shortness of breath?: No  Endocrine Excessive thirst?: No  Musculoskeletal Back pain?: No Joint pain?: No  Neurological Headaches?: No Dizziness?: No  Psychologic Depression?: No Anxiety?: No  Physical Exam: BP (!) 181/71   Pulse 72   Ht 5\' 6"  (1.676 m)   Wt 134 lb 12.8 oz (61.1 kg)   BMI 21.76 kg/m   Constitutional: Well nourished. Alert and oriented, No acute distress. HEENT: [place] AT, moist mucus membranes. Trachea midline, no masses. Cardiovascular: No clubbing, cyanosis, or edema. Respiratory: Normal respiratory effort, no increased work of breathing. GI: Abdomen is soft, non tender, non distended, no abdominal masses. Liver and spleen not palpable.   No hernias appreciated.  Stool sample for occult testing is not indicated.   GU: No CVA tenderness.  No bladder fullness or masses.  Patient with circumcised phallus.   Urethral meatus is patent.  No penile discharge. No penile lesions or rashes.  Skin: No rashes, bruises or suspicious lesions. Lymph: No cervical or inguinal adenopathy. Neurologic: Grossly intact, no focal deficits, moving all 4 extremities. Psychiatric: Normal mood and affect.  Laboratory Data: Lab Results  Component Value Date   WBC 4.2 12/02/2016   HGB 10.2 (L) 12/02/2016   HCT 32.2 (L) 12/02/2016   MCV 86.9 12/02/2016   PLT 123 (L) 12/02/2016    Lab Results  Component Value Date   CREATININE 1.55 (H) 12/07/2016    Lab Results  Component Value Date   HGBA1C 7.2 (H) 07/30/2016    Lab Results  Component Value Date   TSH  2.926 01/16/2016    Lab Results  Component Value Date   AST 23 07/30/2016   Lab Results  Component Value Date   ALT 16 (L) 07/30/2016    Urinalysis 11-30 WBC's.  See EPIC.    I have reviewed the labs.   Assessment & Plan:    1. 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  - repeat TURBT 10/2015 for 2 cm area of patchy erythema involving the right lateral bladder wall beyond the right UO, heavytrabeculation, no obvious papillary lesions. 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  - repeat TURBT 12/2016 for a raised/thickened possible early papillary area just later to Right UO and possibly over the intramural ureter concerning for recurrence.  Surgical pathology = consistent with high-grade T1 TCC recurrence  - Reviewed BCG treatment course, possible side effects including BCG sepsis, bladder irritation, worsening of her urinary symptoms  - # 3 of 6 BCG installed today  - Patient  was instructed to pour bleach down his/her toilet for the next 6 hours  -  Instructed to call the office if she should experience fevers greater than 102, chills/rigors, onset of a new cough, night sweats or further bladder spasms or inability to urinate   - RTC in one week for # 4 BCG  - Surveillance protocol also discussed today including cystoscopy every 3 months for at least 2 years and then spread out thereafter  - Urinalysis, Complete  - bcg vaccine injection 81 mg; Instill 3.24 mLs (81 mg total) into the bladder once.  - lidocaine (XYLOCAINE) 2 % jelly 1 application; Place 1 application into the urethra once.   Return in about 1 week (around 01/26/2017) for # 4 BCG.  These notes generated with voice recognition software. I apologize for typographical errors.  Zara Council, [facility] [address] [place], [place] [address] [phone]

## 2017-01-19 ENCOUNTER — Ambulatory Visit (INDEPENDENT_AMBULATORY_CARE_PROVIDER_SITE_OTHER): Payer: Medicare Other | Admitting: Urology

## 2017-01-19 ENCOUNTER — Encounter: Payer: Self-pay | Admitting: Urology

## 2017-01-19 VITALS — BP 181/71 | HR 72 | Ht 66.0 in | Wt 134.8 lb

## 2017-01-19 DIAGNOSIS — C672 Malignant neoplasm of lateral wall of bladder: Secondary | ICD-10-CM

## 2017-01-19 LAB — URINALYSIS, COMPLETE
Bilirubin, UA: NEGATIVE
GLUCOSE, UA: NEGATIVE
KETONES UA: NEGATIVE
NITRITE UA: NEGATIVE
RBC, UA: NEGATIVE
SPEC GRAV UA: 1.015 (ref 1.005–1.030)
UUROB: 0.2 mg/dL (ref 0.2–1.0)
pH, UA: 5.5 (ref 5.0–7.5)

## 2017-01-19 LAB — MICROSCOPIC EXAMINATION

## 2017-01-19 MED ORDER — BCG LIVE 50 MG IS SUSR: 3.2400 mL | Freq: Once | INTRAVESICAL | Status: DC

## 2017-01-19 MED ORDER — BCG LIVE 50 MG IS SUSR
3.2400 mL | Freq: Once | INTRAVESICAL | Status: AC
  Administered 2017-01-19: 81 mg via INTRAVESICAL

## 2017-01-21 ENCOUNTER — Emergency Department: Payer: Medicare Other

## 2017-01-21 ENCOUNTER — Other Ambulatory Visit: Payer: Self-pay

## 2017-01-21 ENCOUNTER — Inpatient Hospital Stay
Admission: EM | Admit: 2017-01-21 | Discharge: 2017-01-23 | DRG: 871 | Disposition: A | Discharge status: Home Health | Payer: Medicare Other | Attending: Internal Medicine | Admitting: Internal Medicine

## 2017-01-21 DIAGNOSIS — A419 Sepsis, unspecified organism: Secondary | ICD-10-CM | POA: Diagnosis present

## 2017-01-21 DIAGNOSIS — Z880 Allergy status to penicillin: Secondary | ICD-10-CM | POA: Diagnosis not present

## 2017-01-21 DIAGNOSIS — K219 Gastro-esophageal reflux disease without esophagitis: Secondary | ICD-10-CM | POA: Diagnosis present

## 2017-01-21 DIAGNOSIS — N182 Chronic kidney disease, stage 2 (mild): Secondary | ICD-10-CM | POA: Diagnosis present

## 2017-01-21 DIAGNOSIS — E11649 Type 2 diabetes mellitus with hypoglycemia without coma: Secondary | ICD-10-CM | POA: Diagnosis present

## 2017-01-21 DIAGNOSIS — E860 Dehydration: Secondary | ICD-10-CM | POA: Diagnosis present

## 2017-01-21 DIAGNOSIS — F0390 Unspecified dementia without behavioral disturbance: Secondary | ICD-10-CM | POA: Diagnosis present

## 2017-01-21 DIAGNOSIS — Z79899 Other long term (current) drug therapy: Secondary | ICD-10-CM

## 2017-01-21 DIAGNOSIS — I129 Hypertensive chronic kidney disease with stage 1 through stage 4 chronic kidney disease, or unspecified chronic kidney disease: Secondary | ICD-10-CM | POA: Diagnosis present

## 2017-01-21 DIAGNOSIS — Z794 Long term (current) use of insulin: Secondary | ICD-10-CM

## 2017-01-21 DIAGNOSIS — Z8551 Personal history of malignant neoplasm of bladder: Secondary | ICD-10-CM | POA: Diagnosis not present

## 2017-01-21 DIAGNOSIS — Z90411 Acquired partial absence of pancreas: Secondary | ICD-10-CM | POA: Diagnosis not present

## 2017-01-21 DIAGNOSIS — Z23 Encounter for immunization: Secondary | ICD-10-CM | POA: Diagnosis not present

## 2017-01-21 DIAGNOSIS — J181 Lobar pneumonia, unspecified organism: Secondary | ICD-10-CM | POA: Diagnosis present

## 2017-01-21 DIAGNOSIS — E1122 Type 2 diabetes mellitus with diabetic chronic kidney disease: Secondary | ICD-10-CM | POA: Diagnosis present

## 2017-01-21 DIAGNOSIS — E785 Hyperlipidemia, unspecified: Secondary | ICD-10-CM | POA: Diagnosis present

## 2017-01-21 DIAGNOSIS — I2510 Atherosclerotic heart disease of native coronary artery without angina pectoris: Secondary | ICD-10-CM | POA: Diagnosis present

## 2017-01-21 DIAGNOSIS — Z66 Do not resuscitate: Secondary | ICD-10-CM | POA: Diagnosis present

## 2017-01-21 DIAGNOSIS — Z8507 Personal history of malignant neoplasm of pancreas: Secondary | ICD-10-CM | POA: Diagnosis not present

## 2017-01-21 LAB — CBC WITH DIFFERENTIAL/PLATELET
BASOS PCT: 0 %
Basophils Absolute: 0 10*3/uL (ref 0–0.1)
EOS ABS: 0 10*3/uL (ref 0–0.7)
EOS PCT: 0 %
HCT: 35.8 % — ABNORMAL LOW (ref 40.0–52.0)
Hemoglobin: 11.3 g/dL — ABNORMAL LOW (ref 13.0–18.0)
Lymphocytes Relative: 2 %
Lymphs Abs: 0.2 10*3/uL — ABNORMAL LOW (ref 1.0–3.6)
MCH: 28.5 pg (ref 26.0–34.0)
MCHC: 31.7 g/dL — AB (ref 32.0–36.0)
MCV: 90 fL (ref 80.0–100.0)
MONO ABS: 1.4 10*3/uL — AB (ref 0.2–1.0)
MONOS PCT: 11 %
NEUTROS PCT: 87 %
Neutro Abs: 10.4 10*3/uL — ABNORMAL HIGH (ref 1.4–6.5)
PLATELETS: 137 10*3/uL — AB (ref 150–440)
RBC: 3.98 MIL/uL — ABNORMAL LOW (ref 4.40–5.90)
RDW: 20.9 % — AB (ref 11.5–14.5)
WBC: 12 10*3/uL — ABNORMAL HIGH (ref 3.8–10.6)

## 2017-01-21 LAB — COMPREHENSIVE METABOLIC PANEL
ALBUMIN: 3.6 g/dL (ref 3.5–5.0)
ALK PHOS: 127 U/L — AB (ref 38–126)
ALT: 30 U/L (ref 17–63)
ANION GAP: 11 (ref 5–15)
AST: 42 U/L — ABNORMAL HIGH (ref 15–41)
BILIRUBIN TOTAL: 0.6 mg/dL (ref 0.3–1.2)
BUN: 23 mg/dL — ABNORMAL HIGH (ref 6–20)
CALCIUM: 8.6 mg/dL — AB (ref 8.9–10.3)
CO2: 23 mmol/L (ref 22–32)
CREATININE: 1.43 mg/dL — AB (ref 0.61–1.24)
Chloride: 106 mmol/L (ref 101–111)
GFR calc Af Amer: 49 mL/min — ABNORMAL LOW (ref >60)
GFR calc non Af Amer: 42 mL/min — ABNORMAL LOW (ref >60)
GLUCOSE: 115 mg/dL — AB (ref 65–99)
Potassium: 3.7 mmol/L (ref 3.5–5.1)
Sodium: 140 mmol/L (ref 135–145)
TOTAL PROTEIN: 6.2 g/dL — AB (ref 6.5–8.1)

## 2017-01-21 LAB — TROPONIN I: Troponin I: 0.03 ng/mL (ref <0.03)

## 2017-01-21 LAB — INFLUENZA PANEL BY PCR (TYPE A & B)
Influenza A By PCR: NEGATIVE
Influenza B By PCR: NEGATIVE

## 2017-01-21 LAB — URINALYSIS, COMPLETE (UACMP) WITH MICROSCOPIC
BILIRUBIN URINE: NEGATIVE
Bacteria, UA: NONE SEEN
Glucose, UA: NEGATIVE mg/dL
HGB URINE DIPSTICK: NEGATIVE
Ketones, ur: NEGATIVE mg/dL
Leukocytes, UA: NEGATIVE
NITRITE: NEGATIVE
PH: 6 (ref 5.0–8.0)
Protein, ur: NEGATIVE mg/dL
SPECIFIC GRAVITY, URINE: 1.008 (ref 1.005–1.030)

## 2017-01-21 LAB — GLUCOSE, CAPILLARY: Glucose-Capillary: 74 mg/dL (ref 65–99)

## 2017-01-21 LAB — LACTIC ACID, PLASMA
Lactic Acid, Venous: 1.7 mmol/L (ref 0.5–1.9)
Lactic Acid, Venous: 1.1 mmol/L (ref 0.5–1.9)

## 2017-01-21 MED ORDER — FERROUS SULFATE 325 (65 FE) MG PO TABS
325.0000 mg | ORAL_TABLET | Freq: Every day | ORAL | Status: DC
  Administered 2017-01-22 – 2017-01-23 (×2): 325 mg via ORAL
  Filled 2017-01-21 (×2): qty 1

## 2017-01-21 MED ORDER — DEXTROSE 5 % IV SOLN
1.0000 g | INTRAVENOUS | Status: DC
  Administered 2017-01-22: 18:00:00 1 g via INTRAVENOUS
  Filled 2017-01-21 (×2): qty 10

## 2017-01-21 MED ORDER — ACETAMINOPHEN 650 MG RE SUPP: 650.0000 mg | Freq: Four times a day (QID) | RECTAL | Status: DC | PRN

## 2017-01-21 MED ORDER — DEXTROSE 5 % IV SOLN
2.0000 g | Freq: Once | INTRAVENOUS | Status: AC
  Administered 2017-01-21: 2 g via INTRAVENOUS
  Filled 2017-01-21: qty 2

## 2017-01-21 MED ORDER — INSULIN GLARGINE 100 UNIT/ML ~~LOC~~ SOLN
20.0000 [IU] | Freq: Every evening | SUBCUTANEOUS | Status: DC
  Administered 2017-01-21: 20 [IU] via SUBCUTANEOUS
  Filled 2017-01-21 (×2): qty 0.2

## 2017-01-21 MED ORDER — SODIUM CHLORIDE 0.9 % IV SOLN
Freq: Once | INTRAVENOUS | Status: AC
  Administered 2017-01-21: 75 mL/h via INTRAVENOUS

## 2017-01-21 MED ORDER — AZITHROMYCIN 250 MG PO TABS
250.0000 mg | ORAL_TABLET | Freq: Every day | ORAL | Status: DC
  Administered 2017-01-22 – 2017-01-23 (×2): 250 mg via ORAL
  Filled 2017-01-21 (×2): qty 1

## 2017-01-21 MED ORDER — LINAGLIPTIN 5 MG PO TABS
5.0000 mg | ORAL_TABLET | Freq: Every day | ORAL | Status: DC
  Filled 2017-01-21: qty 1

## 2017-01-21 MED ORDER — SIMVASTATIN 20 MG PO TABS
20.0000 mg | ORAL_TABLET | Freq: Every day | ORAL | Status: DC
  Administered 2017-01-21 – 2017-01-22 (×2): 20 mg via ORAL
  Filled 2017-01-21 (×2): qty 1

## 2017-01-21 MED ORDER — VANCOMYCIN HCL IN DEXTROSE 1-5 GM/200ML-% IV SOLN
1000.0000 mg | Freq: Once | INTRAVENOUS | Status: AC
  Administered 2017-01-21: 1000 mg via INTRAVENOUS
  Filled 2017-01-21: qty 200

## 2017-01-21 MED ORDER — TRAMADOL HCL 50 MG PO TABS: 50.0000 mg | ORAL_TABLET | Freq: Four times a day (QID) | ORAL | Status: DC | PRN

## 2017-01-21 MED ORDER — ACETAMINOPHEN 325 MG PO TABS: 650.0000 mg | ORAL_TABLET | Freq: Four times a day (QID) | ORAL | Status: DC | PRN

## 2017-01-21 MED ORDER — INFLUENZA VAC SPLIT HIGH-DOSE 0.5 ML IM SUSY
0.5000 mL | PREFILLED_SYRINGE | INTRAMUSCULAR | Status: AC
  Administered 2017-01-22: 09:00:00 0.5 mL via INTRAMUSCULAR
  Filled 2017-01-21: qty 0.5

## 2017-01-21 MED ORDER — ONDANSETRON HCL 4 MG/2ML IJ SOLN: 4.0000 mg | Freq: Four times a day (QID) | INTRAMUSCULAR | Status: DC | PRN

## 2017-01-21 MED ORDER — MEMANTINE HCL 5 MG PO TABS
10.0000 mg | ORAL_TABLET | Freq: Two times a day (BID) | ORAL | Status: DC
  Administered 2017-01-21 – 2017-01-23 (×4): 10 mg via ORAL
  Filled 2017-01-21 (×4): qty 2

## 2017-01-21 MED ORDER — SODIUM CHLORIDE 0.9 % IV BOLUS (SEPSIS)
1000.0000 mL | Freq: Once | INTRAVENOUS | Status: AC
  Administered 2017-01-21: 1000 mL via INTRAVENOUS

## 2017-01-21 MED ORDER — INSULIN ASPART 100 UNIT/ML ~~LOC~~ SOLN
0.0000 [IU] | Freq: Three times a day (TID) | SUBCUTANEOUS | Status: DC
  Administered 2017-01-22 – 2017-01-23 (×2): 2 [IU] via SUBCUTANEOUS
  Filled 2017-01-21 (×2): qty 1

## 2017-01-21 MED ORDER — PNEUMOCOCCAL VAC POLYVALENT 25 MCG/0.5ML IJ INJ
0.5000 mL | INJECTION | INTRAMUSCULAR | Status: AC
  Administered 2017-01-22: 09:00:00 0.5 mL via INTRAMUSCULAR
  Filled 2017-01-21: qty 0.5

## 2017-01-21 MED ORDER — ENOXAPARIN SODIUM 40 MG/0.4ML ~~LOC~~ SOLN
40.0000 mg | SUBCUTANEOUS | Status: DC
  Administered 2017-01-21: 40 mg via SUBCUTANEOUS
  Filled 2017-01-21: qty 0.4

## 2017-01-21 MED ORDER — AZITHROMYCIN 500 MG PO TABS
500.0000 mg | ORAL_TABLET | Freq: Every day | ORAL | Status: AC
  Administered 2017-01-21: 500 mg via ORAL
  Filled 2017-01-21: qty 1

## 2017-01-21 MED ORDER — ONDANSETRON HCL 4 MG PO TABS: 4.0000 mg | ORAL_TABLET | Freq: Four times a day (QID) | ORAL | Status: DC | PRN

## 2017-01-21 NOTE — ED Notes (Signed)
Pt urine obtain clean catch.

## 2017-01-21 NOTE — Progress Notes (Signed)
Received a call from [facility], aware that pt is getting admitted. She said that pt did not receive the flu shot yet. This information was also verified by pt's daughter [name]. Will order vaccines per protocol to be administered in the morning.

## 2017-01-21 NOTE — H&P (Signed)
[place] [facility] [place] NAME: Colton Tyler    MR#:  [id number]  DATE OF BIRTH:  1926-02-15  DATE OF ADMISSION:  01/21/2017  PRIMARY CARE PHYSICIAN: Kirk Ruths, MD   REQUESTING/REFERRING PHYSICIAN: Dr. Quentin Cornwall  CHIEF COMPLAINT:  Patient brought in from [facility] assisted living because he was not his usual self  HISTORY OF PRESENT ILLNESS:  Colton Tyler  is a 83 y.o. male with a known history of bladder cancer on BCG treatment, chronic kidney disease stage II/III, mild dementia, GERD comes to the emergency room from assisted living after he was not his usual self.  He appeared very weak he slumped down while the nurse was taking his vitals brought to the emergency room was found to have fever of 102.9 tachypneic and workup showed early pneumonia.  Patient received Vanco and cefepime in the emergency room.  He is being admitted for further vaginal management.  Daughter present in the emergency room.  She is [facility] POA and tells me patient is a DNR.  PAST MEDICAL HISTORY:   Past Medical History:  Diagnosis Date  . Bladder cancer ([place])   . Chronic kidney disease   . Coronary artery disease   . Dementia   . Diabetes ([place])   . Dysrhythmia   . GERD (gastroesophageal reflux disease)   . Hematuria, gross 07/23/2014  . Hyperlipidemia   . Hypertension   . Pancreatic cancer [facility])    s/p whipple procedure    PAST SURGICAL HISTOIRY:   Past Surgical History:  Procedure Laterality Date  . BLADDER SURGERY    . COLONOSCOPY WITH PROPOFOL N/A 07/31/2016   Procedure: COLONOSCOPY WITH PROPOFOL;  Surgeon: [name], MD;  Location: [facility] ENDOSCOPY;  Service: Endoscopy;  Laterality: N/A;  . CYSTOSCOPY W/ RETROGRADES Bilateral 12/07/2016   Procedure: CYSTOSCOPY WITH RETROGRADE PYELOGRAM;  Surgeon: [name], MD;  Location: [facility] ORS;  Service: Urology;  Laterality: Bilateral;  . CYSTOSCOPY WITH BIOPSY N/A 11/05/2015   Procedure:  CYSTOSCOPY WITH BIOPSY;  Surgeon: [name], MD;  Location: [facility] ORS;  Service: Urology;  Laterality: N/A;  . CYSTOSCOPY WITH BIOPSY N/A 12/07/2016   Procedure: CYSTOSCOPY WITH BLADDER BIOPSY;  Surgeon: [name], MD;  Location: [facility] ORS;  Service: Urology;  Laterality: N/A;  . CYSTOSCOPY WITH FULGERATION  11/05/2015   Procedure: CYSTOSCOPY WITH FULGERATION;  Surgeon: [name], MD;  Location: [facility] ORS;  Service: Urology;;  . [name] WITH STENT PLACEMENT Right 12/07/2016   Procedure: CYSTOSCOPY WITH STENT PLACEMENT;  Surgeon: [name], MD;  Location: [facility] ORS;  Service: Urology;  Laterality: Right;  . ESOPHAGOGASTRODUODENOSCOPY (EGD) WITH PROPOFOL N/A 07/31/2016   Procedure: ESOPHAGOGASTRODUODENOSCOPY (EGD) WITH PROPOFOL;  Surgeon: [name], MD;  Location: [facility] ENDOSCOPY;  Service: Endoscopy;  Laterality: N/A;  . HERNIA REPAIR Left    Inguinal Hernia Repair  . PORTACATH PLACEMENT Right 2015   [facility]  . WHIPPLE PROCEDURE    . WHIPPLE PROCEDURE      SOCIAL HISTORY:   Social History   Tobacco Use  . Smoking status: Never Smoker  . Smokeless tobacco: Never Used  Substance Use Topics  . Alcohol use: No    Alcohol/week: 0.0 oz    FAMILY HISTORY:   Family History  Problem Relation Age of Onset  . Cerebrovascular Accident Father   . Stomach cancer Sister   . Kidney disease Neg Hx   . Prostate cancer Neg Hx   . Kidney cancer Neg Hx   .  Bladder Cancer Neg Hx     DRUG ALLERGIES:   Allergies  Allergen Reactions  . Penicillins Other (See Comments)    "unknown"    REVIEW OF SYSTEMS:  Review of Systems  Constitutional: Positive for fever. Negative for chills and weight loss.  HENT: Negative for ear discharge, ear pain and nosebleeds.   Eyes: Negative for blurred vision, pain and discharge.  Respiratory: Positive for cough and shortness of breath. Negative for sputum production, wheezing and stridor.   Cardiovascular: Negative for chest pain,  palpitations, orthopnea and PND.  Gastrointestinal: Negative for abdominal pain, diarrhea, nausea and vomiting.  Genitourinary: Negative for frequency and urgency.  Musculoskeletal: Negative for back pain and joint pain.  Neurological: Positive for weakness. Negative for sensory change, speech change and focal weakness.  Psychiatric/Behavioral: Negative for depression and hallucinations. The patient is not nervous/anxious.      MEDICATIONS AT HOME:   Prior to Admission medications   Medication Sig Start Date End Date Taking? Authorizing Provider  ferrous sulfate 325 (65 FE) MG EC tablet Take 325 mg by mouth daily with breakfast.   Yes [provider]  LANTUS SOLOSTAR 100 UNIT/ML Solostar Pen Inject 20 Units every evening into the skin.  06/20/16  Yes [provider]  memantine (NAMENDA) 10 MG tablet Take 10 mg by mouth 2 (two) times daily.  02/12/15  Yes [provider]  simvastatin (ZOCOR) 20 MG tablet Take 20 mg by mouth at bedtime.  02/12/15  Yes [provider]  sitaGLIPtin (JANUVIA) 50 MG tablet TAKE 1 TABLET BY MOUTH EVERY DAY AT NOON 10/16/16  Yes [provider]      VITAL SIGNS:  Blood pressure (!) 113/58, pulse 74, temperature (!) 102.8 F (39.3 C), temperature source Oral, resp. rate 15, height 5\' 7"  (1.702 m), weight 68 kg (150 lb), SpO2 100 %. Patient had tachypnea with respiratory rate of 20-28 earlier in the emergency room PHYSICAL EXAMINATION:  GENERAL:  83 y.o.-year-old patient lying in the bed with no acute distress.  Appears very weak and debilitated EYES: Pupils equal, round, reactive to light and accommodation. No scleral icterus. Extraocular muscles intact.  HEENT: Head atraumatic, normocephalic. Oropharynx and nasopharynx clear.  NECK:  Supple, no jugular venous distention. No thyroid enlargement, no tenderness.  LUNGS: Normal breath sounds bilaterally, no wheezing, rales,rhonchi or crepitation. No use of accessory muscles  of respiration.  CARDIOVASCULAR: S1, S2 normal. No murmurs, rubs, or gallops.  ABDOMEN: Soft, nontender, nondistended. Bowel sounds present. No organomegaly or mass.  EXTREMITIES: No pedal edema, cyanosis, or clubbing.  NEUROLOGIC: Unable to test secondary to patient's attentiveness  pSYCHIATRIC: The patient is sleepy, or answered most of the questions appropriately SKIN: No obvious rash, lesion, or ulcer.   LABORATORY PANEL:   CBC Recent Labs  Lab 01/21/17 1630  WBC 12.0*  HGB 11.3*  HCT 35.8*  PLT 137*   ------------------------------------------------------------------------------------------------------------------  Chemistries  Recent Labs  Lab 01/21/17 1630  NA 140  K 3.7  CL 106  CO2 23  GLUCOSE 115*  BUN 23*  CREATININE 1.43*  CALCIUM 8.6*  AST 42*  ALT 30  ALKPHOS 127*  BILITOT 0.6   ------------------------------------------------------------------------------------------------------------------  Cardiac Enzymes Recent Labs  Lab 01/21/17 1630  TROPONINI 0.03*   ------------------------------------------------------------------------------------------------------------------  RADIOLOGY:  Dg Chest 1 View  Result Date: 01/21/2017 CLINICAL DATA:  Mental status change, weakness, sepsis, possible pneumonia. EXAM: CHEST 1 VIEW COMPARISON:  Chest x-ray of January 16, 2016 FINDINGS: The lungs are well-expanded. Subtle increased  density overlying the cardiac apex and partially obscuring the lateral costophrenic angle. There is no pleural effusion. The cardiac silhouette is mildly enlarged. The pulmonary vascularity is normal. There is calcification in the wall of the aortic arch. The porta catheter tip projects over the midportion of the SVC. The observed bony thorax is unremarkable. IMPRESSION: Subsegmental atelectasis or early pneumonia in the lateral aspect of the lingula. Mild cardiomegaly without pulmonary vascular congestion or pulmonary edema. Thoracic  aortic atherosclerosis. Electronically Signed   By: David  Martinique M.D.   On: 01/21/2017 17:14    EKG:    IMPRESSION AND PLAN:   Bryan Goin  is a 83 y.o. male with a known history of bladder cancer on BCG treatment, chronic kidney disease stage II/III, mild dementia, GERD comes to the emergency room from assisted living after he was not his usual self.  He appeared very weak he slumped down while the nurse was taking his vitals brought to the emergency room was found to have fever of 102.9 tachypneic and workup showed early pneumonia.   1.  Sepsis due to pneumonia, early -She came in with fever of 102.9, tachypnea, elevated white count, chest x-ray positive for pneumonia -IV Rocephin and Zithromax -Follow blood cultures -Monitor fever curve and WBC count  2.  Generalized weakness debility and mild dehydration clinically -IV fluids  3.  Leukocytosis due to #1  4.  History of bladder cancer -Patient gets treatment with BCG--- follows with [facility]  5.  Early dementia  6.  DVT prophylaxis subcu Lovenox  D/w pt's dter in the ER  All the records are reviewed and case discussed with ED provider. Management plans discussed with the patient, family and they are in agreement.  CODE STATUS: DNR  TOTAL TIME TAKING CARE OF THIS PATIENT: 50 minutes.    Fritzi Mandes M.D on 01/21/2017 at 7:10 PM  Between 7am to 6pm - Pager - [phone]  After 6pm go to [email or web address] - password [email or web address] [facility]  [facility]  Office  [phone]  CC: Primary care physician; Kirk Ruths, MD

## 2017-01-21 NOTE — ED Notes (Signed)
Hospitalist to bedside at this time 

## 2017-01-21 NOTE — ED Notes (Signed)
Date and time results received: 01/21/17 1723 (use smartphrase ".now" to insert current time)  Test: Troponin Critical Value:0.03  Name of Provider Notified: Dr. Quentin Cornwall  Orders Received? Or Actions Taken?:

## 2017-01-21 NOTE — ED Triage Notes (Signed)
Pt arrived via EMS from [facility]. Wife stated that "he wasn't acting right" and that he had weakness. Pt has a Hx of HTN and Diabetes. VS per EMS WNL BP-140/60 o2sat-97%RA Temp 98.6 BS-135. Pt wife stated that she is not sure if he has a early Dementia or not. EMS stated that pt has a power of attorney. Pt has an allergy to Penicillin. Pt denies any pain or discomfort and is resting comfortably.

## 2017-01-21 NOTE — ED Notes (Signed)
Per Dr. Quentin Cornwall, weight-based fluids not needed for this patient.

## 2017-01-21 NOTE — ED Provider Notes (Signed)
[facility] Emergency Department Provider Note    First MD Initiated Contact with Patient 01/21/17 1640     (approximate)  I have reviewed the triage vital signs and the nursing notes.   HISTORY  Chief Complaint Weakness  Level v  Caveat:  dementia  HPI MATEO OVERBECK is a 83 y.o. male below listed chronic medical issues presents with chief complaint of "not acting right" as well as report of weakness starting today.  No other complaints noted.  Patient does have a history of dementia and does not provide much additional history.  Denies any plantar pain at this time.  No nausea or vomiting.  Patient brought in the ER and was found to be with fever to 102.8.  No recent admissions.  No recent antibiotics.  Past Medical History:  Diagnosis Date  . Bladder cancer ([place])   . Chronic kidney disease   . Coronary artery disease   . Dementia   . Diabetes ([place])   . Dysrhythmia   . GERD (gastroesophageal reflux disease)   . Hematuria, gross 07/23/2014  . Hyperlipidemia   . Hypertension   . Pancreatic cancer [facility])    s/p whipple procedure   Family History  Problem Relation Age of Onset  . Cerebrovascular Accident Father   . Stomach cancer Sister   . Kidney disease Neg Hx   . Prostate cancer Neg Hx   . Kidney cancer Neg Hx   . Bladder Cancer Neg Hx    Past Surgical History:  Procedure Laterality Date  . BLADDER SURGERY    . COLONOSCOPY WITH PROPOFOL N/A 07/31/2016   Procedure: COLONOSCOPY WITH PROPOFOL;  Surgeon: [name], MD;  Location: [facility] ENDOSCOPY;  Service: Endoscopy;  Laterality: N/A;  . CYSTOSCOPY W/ RETROGRADES Bilateral 12/07/2016   Procedure: CYSTOSCOPY WITH RETROGRADE PYELOGRAM;  Surgeon: [name], MD;  Location: [facility] ORS;  Service: Urology;  Laterality: Bilateral;  . CYSTOSCOPY WITH BIOPSY N/A 11/05/2015   Procedure: CYSTOSCOPY WITH BIOPSY;  Surgeon: [name], MD;  Location: [facility] ORS;  Service: Urology;  Laterality: N/A;  .  CYSTOSCOPY WITH BIOPSY N/A 12/07/2016   Procedure: CYSTOSCOPY WITH BLADDER BIOPSY;  Surgeon: [name], MD;  Location: [facility] ORS;  Service: Urology;  Laterality: N/A;  . CYSTOSCOPY WITH FULGERATION  11/05/2015   Procedure: CYSTOSCOPY WITH FULGERATION;  Surgeon: [name], MD;  Location: [facility] ORS;  Service: Urology;;  . [name] WITH STENT PLACEMENT Right 12/07/2016   Procedure: CYSTOSCOPY WITH STENT PLACEMENT;  Surgeon: [name], MD;  Location: [facility] ORS;  Service: Urology;  Laterality: Right;  . ESOPHAGOGASTRODUODENOSCOPY (EGD) WITH PROPOFOL N/A 07/31/2016   Procedure: ESOPHAGOGASTRODUODENOSCOPY (EGD) WITH PROPOFOL;  Surgeon: [name], MD;  Location: [facility] ENDOSCOPY;  Service: Endoscopy;  Laterality: N/A;  . HERNIA REPAIR Left    Inguinal Hernia Repair  . PORTACATH PLACEMENT Right 2015   [facility]  . WHIPPLE PROCEDURE    . WHIPPLE PROCEDURE     Patient Active Problem List   Diagnosis Date Noted  . Sepsis ([place]) 01/21/2017  . Benign neoplasm of cecum   . Angiodysplasia of stomach and duodenum with hemorrhage   . Anemia 11/02/2014  . Hematuria, gross 07/23/2014  . Bladder mass 06/09/2014  . Bladder cancer ([place]) 06/09/2014  . Hematochezia 06/09/2014  . Diabetes ([place]) 06/09/2014  . Frank hematuria 06/09/2014  . Blood in the urine 06/09/2014  . BP (high blood pressure) 06/09/2014  . Malignant neoplasm of pancreas ([place]) 06/09/2014  . Surgery follow-up examination 06/09/2014  .  Type 2 diabetes mellitus with stage 3 chronic kidney disease, with long-term current use of insulin ([place]) 06/09/2014  . Bladder disease 06/09/2014  . Essential hypertension 06/09/2014  . Urinary catheter insertion/adjustment/removal 08/22/2013  . Fitting and adjustment of urinary device 08/22/2013  . Abnormal ECG 08/03/2013  . Arteriosclerosis of coronary artery 08/03/2013  . Carotid artery narrowing 08/03/2013  . Hyperlipemia 08/03/2013  . Amnesia 08/03/2013  . OP (osteoporosis) 08/03/2013  .  Beat, premature ventricular 08/03/2013  . CAD (coronary artery disease) 08/03/2013  . Carotid artery disease ([place]) 08/03/2013  . Thrombocytopenia ([place]) 07/07/2013      Prior to Admission medications   Medication Sig Start Date End Date Taking? Authorizing Provider  ferrous sulfate 325 (65 FE) MG EC tablet Take 325 mg by mouth daily with breakfast.   Yes [provider]  LANTUS SOLOSTAR 100 UNIT/ML Solostar Pen Inject 20 Units every evening into the skin.  06/20/16  Yes [provider]  memantine (NAMENDA) 10 MG tablet Take 10 mg by mouth 2 (two) times daily.  02/12/15  Yes [provider]  simvastatin (ZOCOR) 20 MG tablet Take 20 mg by mouth at bedtime.  02/12/15  Yes [provider]  sitaGLIPtin (JANUVIA) 50 MG tablet TAKE 1 TABLET BY MOUTH EVERY DAY AT NOON 10/16/16  Yes [provider]    Allergies Penicillins    Social History Social History   Tobacco Use  . Smoking status: Never Smoker  . Smokeless tobacco: Never Used  Substance Use Topics  . Alcohol use: No    Alcohol/week: 0.0 oz  . Drug use: No    Review of Systems Patient denies headaches, rhinorrhea, blurry vision, numbness, shortness of breath, chest pain, edema, cough, abdominal pain, nausea, vomiting, diarrhea, dysuria, fevers, rashes or hallucinations unless otherwise stated above in HPI. ____________________________________________   PHYSICAL EXAM:  VITAL SIGNS: Vitals:   01/21/17 2048 01/21/17 2139  BP:  (!) 123/54  Pulse:  (!) 58  Resp:  15  Temp:  98.9 F (37.2 C)  SpO2: 99% 98%    Constitutional: Alert ill appearing but in no acute distress. Eyes: Conjunctivae are normal.  Head: Atraumatic. Nose: No congestion/rhinnorhea. Mouth/Throat: Mucous membranes are moist.   Neck: No stridor. Painless ROM.  Cardiovascular: Normal rate, regular rhythm. Grossly normal heart sounds.  Good peripheral circulation. Respiratory: Normal respiratory effort.  No  retractions. Lungs CTAB. Gastrointestinal: Soft and nontender. No distention. No abdominal bruits. No CVA tenderness. Genitourinary:  Musculoskeletal: No lower extremity tenderness nor edema.  No joint effusions. Neurologic: No gross focal neurologic deficits are appreciated. No facial droop Skin:  Skin is warm, dry and intact. No rash noted. Psychiatric: Mood and affect are normal. Speech and behavior are normal.  ____________________________________________   LABS (all labs ordered are listed, but only abnormal results are displayed)  Results for orders placed or performed during the hospital encounter of 01/21/17 (from the past 24 hour(s))  Lactic acid, plasma     Status: None   Collection Time: 01/21/17  4:30 PM  Result Value Ref Range   Lactic Acid, Venous 1.7 0.5 - 1.9 mmol/L  Comprehensive metabolic panel     Status: Abnormal   Collection Time: 01/21/17  4:30 PM  Result Value Ref Range   Sodium 140 135 - 145 mmol/L   Potassium 3.7 3.5 - 5.1 mmol/L   Chloride 106 101 - 111 mmol/L   CO2 23 22 - 32 mmol/L   Glucose, Bld 115 (H) 65 - 99 mg/dL  BUN 23 (H) 6 - 20 mg/dL   Creatinine, Ser 1.43 (H) 0.61 - 1.24 mg/dL   Calcium 8.6 (L) 8.9 - 10.3 mg/dL   Total Protein 6.2 (L) 6.5 - 8.1 g/dL   Albumin 3.6 3.5 - 5.0 g/dL   AST 42 (H) 15 - 41 U/L   ALT 30 17 - 63 U/L   Alkaline Phosphatase 127 (H) 38 - 126 U/L   Total Bilirubin 0.6 0.3 - 1.2 mg/dL   GFR calc non Af Amer 42 (L) >60 mL/min   GFR calc Af Amer 49 (L) >60 mL/min   Anion gap 11 5 - 15  Troponin I     Status: Abnormal   Collection Time: 01/21/17  4:30 PM  Result Value Ref Range   Troponin I 0.03 (HH) <0.03 ng/mL  CBC WITH DIFFERENTIAL     Status: Abnormal   Collection Time: 01/21/17  4:30 PM  Result Value Ref Range   WBC 12.0 (H) 3.8 - 10.6 K/uL   RBC 3.98 (L) 4.40 - 5.90 MIL/uL   Hemoglobin 11.3 (L) 13.0 - 18.0 g/dL   HCT 35.8 (L) 40.0 - 52.0 %   MCV 90.0 80.0 - 100.0 fL   MCH 28.5 26.0 - 34.0 pg   MCHC 31.7  (L) 32.0 - 36.0 g/dL   RDW 20.9 (H) 11.5 - 14.5 %   Platelets 137 (L) 150 - 440 K/uL   Neutrophils Relative % 87 %   Neutro Abs 10.4 (H) 1.4 - 6.5 K/uL   Lymphocytes Relative 2 %   Lymphs Abs 0.2 (L) 1.0 - 3.6 K/uL   Monocytes Relative 11 %   Monocytes Absolute 1.4 (H) 0.2 - 1.0 K/uL   Eosinophils Relative 0 %   Eosinophils Absolute 0.0 0 - 0.7 K/uL   Basophils Relative 0 %   Basophils Absolute 0.0 0 - 0.1 K/uL  Urinalysis, Complete w Microscopic     Status: Abnormal   Collection Time: 01/21/17  4:30 PM  Result Value Ref Range   Color, Urine STRAW (A) YELLOW   APPearance CLEAR (A) CLEAR   Specific Gravity, Urine 1.008 1.005 - 1.030   pH 6.0 5.0 - 8.0   Glucose, UA NEGATIVE NEGATIVE mg/dL   Hgb urine dipstick NEGATIVE NEGATIVE   Bilirubin Urine NEGATIVE NEGATIVE   Ketones, ur NEGATIVE NEGATIVE mg/dL   Protein, ur NEGATIVE NEGATIVE mg/dL   Nitrite NEGATIVE NEGATIVE   Leukocytes, UA NEGATIVE NEGATIVE   RBC / HPF 0-5 0 - 5 RBC/hpf   WBC, UA 6-30 0 - 5 WBC/hpf   Bacteria, UA NONE SEEN NONE SEEN   Squamous Epithelial / LPF 0-5 (A) NONE SEEN  Influenza panel by PCR (type A & B)     Status: None   Collection Time: 01/21/17  5:13 PM  Result Value Ref Range   Influenza A By PCR NEGATIVE NEGATIVE   Influenza B By PCR NEGATIVE NEGATIVE  Lactic acid, plasma     Status: None   Collection Time: 01/21/17  8:41 PM  Result Value Ref Range   Lactic Acid, Venous 1.1 0.5 - 1.9 mmol/L  Glucose, capillary     Status: None   Collection Time: 01/21/17  9:51 PM  Result Value Ref Range   Glucose-Capillary 74 65 - 99 mg/dL   ____________________________________________  EKG My review and personal interpretation at Time: 16:44   Indication: sepsis  Rate: 90  Rhythm: prolonged PR with intermittent mobitz type one Axis: left Other: normal intervals, no stemi ____________________________________________  RADIOLOGY  I personally reviewed all radiographic images ordered to evaluate for the above  acute complaints and reviewed radiology reports and findings.  These findings were personally discussed with the patient.  Please see medical record for radiology report.  ____________________________________________   PROCEDURES  Procedure(s) performed:  Procedures    Critical Care performed: no ____________________________________________   INITIAL IMPRESSION / ASSESSMENT AND PLAN / ED COURSE  Pertinent labs & imaging results that were available during my care of the patient were reviewed by me and considered in my medical decision making (see chart for details).  DDX: Dehydration, sepsis, pna, uti, hypoglycemia, cva, drug effect, withdrawal, encephalitis   SPENSER HARREN is a 83 y.o. who presents to the ED with described above.  Patient without any hypoxia but does have mild tachypnea.  Blood work sent for the above differential and x-ray ordered to evaluate for pneumonia.  Presentation most concerning for pneumonia.  Patient does meet criteria for sepsis given his Sirs and concern for infiltrate on x-ray.  Will treat for H CAP given his residing in a nursing facility.  Have discussed with the patient and available family all diagnostics and treatments performed thus far and all questions were answered to the best of my ability. The patient demonstrates understanding and agreement with plan.       ____________________________________________   FINAL CLINICAL IMPRESSION(S) / ED DIAGNOSES  Final diagnoses:  Sepsis, due to unspecified organism [facility])      NEW MEDICATIONS STARTED DURING THIS VISIT:  This SmartLink is deprecated. Use AVSMEDLIST instead to display the medication list for a patient.   Note:  This document was prepared using Dragon voice recognition software and may include unintentional dictation errors.    Merlyn Lot, MD 01/21/17 [phone]

## 2017-01-22 ENCOUNTER — Other Ambulatory Visit: Payer: Self-pay

## 2017-01-22 LAB — MRSA PCR SCREENING: MRSA by PCR: NEGATIVE

## 2017-01-22 LAB — GLUCOSE, CAPILLARY
GLUCOSE-CAPILLARY: 61 mg/dL — AB (ref 65–99)
GLUCOSE-CAPILLARY: 157 mg/dL — AB (ref 65–99)
GLUCOSE-CAPILLARY: 95 mg/dL (ref 65–99)
Glucose-Capillary: 36 mg/dL — CL (ref 65–99)
Glucose-Capillary: 93 mg/dL (ref 65–99)
Glucose-Capillary: 101 mg/dL — ABNORMAL HIGH (ref 65–99)

## 2017-01-22 MED ORDER — ENOXAPARIN SODIUM 30 MG/0.3ML ~~LOC~~ SOLN
30.0000 mg | SUBCUTANEOUS | Status: DC
  Administered 2017-01-22: 30 mg via SUBCUTANEOUS
  Filled 2017-01-22: qty 0.3

## 2017-01-22 NOTE — NC FL2 (Signed)
[place] LEVEL OF CARE SCREENING TOOL     IDENTIFICATION  Patient Name: Colton Tyler Birthdate: [birth year] Sex: male Admission Date (Current Location): 01/21/2017  [place] and [place] Number:  Engineering geologist and Address:  [facility], [address] [place], [place] [address]      Provider Number: [id number]  Attending Physician Name and Address:  Bettey Costa, MD  Relative Name and Phone Number:       Current Level of Care: Hospital Recommended Level of Care: [place] Prior Approval Number:    Date Approved/Denied:   PASRR Number:    Discharge Plan: Domiciliary (Rest home)    Current Diagnoses: Patient Active Problem List   Diagnosis Date Noted  . Sepsis ([place]) 01/21/2017  . Benign neoplasm of cecum   . Angiodysplasia of stomach and duodenum with hemorrhage   . Anemia 11/02/2014  . Hematuria, gross 07/23/2014  . Bladder mass 06/09/2014  . Bladder cancer ([place]) 06/09/2014  . Hematochezia 06/09/2014  . Diabetes ([place]) 06/09/2014  . Frank hematuria 06/09/2014  . Blood in the urine 06/09/2014  . BP (high blood pressure) 06/09/2014  . Malignant neoplasm of pancreas ([place]) 06/09/2014  . Surgery follow-up examination 06/09/2014  . Type 2 diabetes mellitus with stage 3 chronic kidney disease, with long-term current use of insulin ([place]) 06/09/2014  . Bladder disease 06/09/2014  . Essential hypertension 06/09/2014  . Urinary catheter insertion/adjustment/removal 08/22/2013  . Fitting and adjustment of urinary device 08/22/2013  . Abnormal ECG 08/03/2013  . Arteriosclerosis of coronary artery 08/03/2013  . Carotid artery narrowing 08/03/2013  . Hyperlipemia 08/03/2013  . Amnesia 08/03/2013  . OP (osteoporosis) 08/03/2013  . Beat, premature ventricular 08/03/2013  . CAD (coronary artery disease) 08/03/2013  . Carotid artery disease ([place]) 08/03/2013  . Thrombocytopenia ([place]) 07/07/2013    Orientation  RESPIRATION BLADDER Height & Weight     Self, Place  Normal Incontinent Weight: 130 lb 8 oz (59.2 kg) Height:  5\' 7"  (170.2 cm)  BEHAVIORAL SYMPTOMS/MOOD NEUROLOGICAL BOWEL NUTRITION STATUS      Continent Diet(Diet: Carb Modified. )  AMBULATORY STATUS COMMUNICATION OF NEEDS Skin   Limited Assist Verbally Normal                       Personal Care Assistance Level of Assistance  Bathing, Feeding, Dressing Bathing Assistance: Limited assistance Feeding assistance: Independent Dressing Assistance: Limited assistance     Functional Limitations Info  Sight, Hearing, Speech Sight Info: Impaired Hearing Info: Impaired Speech Info: Adequate    SPECIAL CARE FACTORS FREQUENCY  PT (By licensed PT)     PT Frequency: (2-3 home health. )              Contractures      Additional Factors Info  Code Status, Allergies Code Status Info: (DNR ) Allergies Info: (Penicillins)           Current Medications (01/22/2017):  This is the current hospital active medication list Current Facility-Administered Medications  Medication Dose Route Frequency Provider Last Rate Last Dose  . acetaminophen (TYLENOL) tablet 650 mg  650 mg Oral Q6H PRN Fritzi Mandes, MD       Or  . acetaminophen (TYLENOL) suppository 650 mg  650 mg Rectal Q6H PRN Fritzi Mandes, MD      . azithromycin [facility]) tablet 250 mg  250 mg Oral Daily Fritzi Mandes, MD   250 mg at 01/22/17 0919  . cefTRIAXone (ROCEPHIN) 1  g in dextrose 5 % 50 mL IVPB  1 g Intravenous Q24H Fritzi Mandes, MD      . enoxaparin (LOVENOX) injection 30 mg  30 mg Subcutaneous Q24H Lenis Noon, [facility]      . ferrous sulfate tablet 325 mg  325 mg Oral Q breakfast Fritzi Mandes, MD   325 mg at 01/22/17 6834  . insulin aspart (novoLOG) injection 0-9 Units  0-9 Units Subcutaneous TID WC Fritzi Mandes, MD   2 Units at 01/22/17 1234  . memantine (NAMENDA) tablet 10 mg  10 mg Oral BID Fritzi Mandes, MD   10 mg at 01/22/17 1962  . ondansetron (ZOFRAN) tablet 4 mg   4 mg Oral Q6H PRN Fritzi Mandes, MD       Or  . ondansetron [facility]) injection 4 mg  4 mg Intravenous Q6H PRN Fritzi Mandes, MD      . simvastatin (ZOCOR) tablet 20 mg  20 mg Oral QHS Fritzi Mandes, MD   20 mg at 01/21/17 2152  . traMADol (ULTRAM) tablet 50 mg  50 mg Oral Q6H PRN Fritzi Mandes, MD       Facility-Administered Medications Ordered in Other Encounters  Medication Dose Route Frequency Provider Last Rate Last Dose  . sodium chloride 0.9 % injection 10 mL  10 mL Intravenous PRN Forest Gleason, MD   10 mL at 07/27/14 1136     Discharge Medications: Please see discharge summary for a list of discharge medications.  Relevant Imaging Results:  Relevant Lab Results:   Additional Information (SSN: [id number])  Colton Tyler, Colton Beets, LCSW

## 2017-01-22 NOTE — Progress Notes (Signed)
New Admission Note:   Arrival Method: per stretcher from ED, pt came from [place] Orientation: alert and oriented to person, place and time. Pt is forgetful but can be redirected and reoriented Assessment: Completed Skin: warm, dry, flaky, with scattered bruises noted on both arms, with abrasion on the right forearm and elbow, prophylactic sacral dressing applied. IV: G18 on the right forearm and left AC with transparent dressing, intact Pain: denies having any pain this time Safety Measures: Safety Fall Prevention Plan has been given and discussed to pt and daughter Admission: Completed 1A Orientation: Patient has been oriented to the room, unit and staff.  Family: daughter Diane at bedside  Orders have been reviewed and implemented. Call light has been placed within reach, yellow socks on, fall risk, allergy and DNR armbands applied, bed alarm activated. Will continue to monitor patient.   Georgeanna Harrison, RN

## 2017-01-22 NOTE — Progress Notes (Signed)
Inpatient Diabetes Program Recommendations  AACE/ADA: New Consensus Statement on Inpatient Glycemic Control (2015)  Target Ranges:  Prepandial:   less than 140 mg/dL      Peak postprandial:   less than 180 mg/dL (1-2 hours)      Critically ill patients:  140 - 180 mg/dL   Lab Results  Component Value Date   GLUCAP 93 01/22/2017   HGBA1C 7.2 (H) 07/30/2016    Inpatient Diabetes Program Recommendations:  Met with patient at the bedside.  He could not tell me how much insulin he takes but RN Stanton Kidney tells me he is from an assisted living facilty so meds have reported from there.  Agree with no Lantus insulin at this time.  Agree with Novolog 0-9 units tid and Novolog 0-5 units qhs.  Gentry Fitz, RN, BA, MHA, CDE Diabetes Coordinator Inpatient Diabetes Program  [phone] (Team Pager) [phone] ([place]) 01/22/2017 11:37 AM

## 2017-01-22 NOTE — Progress Notes (Signed)
Order for enoxaparin 40 mg subcutaneously daily was changed to 30 mg daily dose for DVT prophylaxis. Per protocol for CrCl < 30 mL/min.  Lenis Noon, PharmD 01/22/17 9:44 AM

## 2017-01-22 NOTE — Progress Notes (Signed)
[place] at [place] NAME: Colton Tyler    MR#:  [id number]  DATE OF BIRTH:  28-Jun-1925  SUBJECTIVE:   Patient with low BS this am No other issues overnight No fevers overnight. Patient denies cough. He has generalized weakness   REVIEW OF SYSTEMS:    Review of Systems  Constitutional: Negative for fever, chills weight loss Positive generalized weakness  HENT: Negative for ear pain, nosebleeds, congestion, facial swelling, rhinorrhea, neck pain, neck stiffness and ear discharge.   Respiratory: Negative for cough, shortness of breath, wheezing  Cardiovascular: Negative for chest pain, palpitations and leg swelling.  Gastrointestinal: Negative for heartburn, abdominal pain, vomiting, diarrhea or consitpation Genitourinary: Negative for dysuria, urgency, frequency, hematuria Musculoskeletal: Negative for back pain or joint pain Neurological: Negative for dizziness, seizures, syncope, focal weakness,  numbness and headaches.  Hematological: Does not bruise/bleed easily.  Psychiatric/Behavioral: Negative for hallucinations, confusion, dysphoric mood    Tolerating Diet: yes      DRUG ALLERGIES:   Allergies  Allergen Reactions  . Penicillins Other (See Comments)    "unknown"    VITALS:  Blood pressure (!) 105/50, pulse 68, temperature 98.5 F (36.9 C), temperature source Oral, resp. rate 15, height 5\' 7"  (1.702 m), weight 59.2 kg (130 lb 8 oz), SpO2 97 %.  PHYSICAL EXAMINATION:  Constitutional: Appears well-developed and well-nourished. No distress. HENT: Normocephalic. Marland Kitchen Oropharynx is clear and moist.  Eyes: Conjunctivae and EOM are normal. PERRLA, no scleral icterus.  Neck: Normal ROM. Neck supple. No JVD. No tracheal deviation. CVS: RRR, S1/S2 +, no murmurs, no gallops, no carotid bruit.  Pulmonary: Effort and breath sounds normal, no stridor, rhonchi, wheezes, rales.  Abdominal: Soft. BS +,  no distension, tenderness,  rebound or guarding.  Musculoskeletal: Normal range of motion. No edema and no tenderness.  Neuro: Alert. CN 2-12 grossly intact. No focal deficits. Skin: Skin is warm and dry. No rash noted. Psychiatric: Normal mood and affect.      LABORATORY PANEL:   CBC Recent Labs  Lab 01/21/17 1630  WBC 12.0*  HGB 11.3*  HCT 35.8*  PLT 137*   ------------------------------------------------------------------------------------------------------------------  Chemistries  Recent Labs  Lab 01/21/17 1630  NA 140  K 3.7  CL 106  CO2 23  GLUCOSE 115*  BUN 23*  CREATININE 1.43*  CALCIUM 8.6*  AST 42*  ALT 30  ALKPHOS 127*  BILITOT 0.6   ------------------------------------------------------------------------------------------------------------------  Cardiac Enzymes Recent Labs  Lab 01/21/17 1630  TROPONINI 0.03*   ------------------------------------------------------------------------------------------------------------------  RADIOLOGY:  Dg Chest 1 View  Result Date: 01/21/2017 CLINICAL DATA:  Mental status change, weakness, sepsis, possible pneumonia. EXAM: CHEST 1 VIEW COMPARISON:  Chest x-ray of January 16, 2016 FINDINGS: The lungs are well-expanded. Subtle increased density overlying the cardiac apex and partially obscuring the lateral costophrenic angle. There is no pleural effusion. The cardiac silhouette is mildly enlarged. The pulmonary vascularity is normal. There is calcification in the wall of the aortic arch. The porta catheter tip projects over the midportion of the SVC. The observed bony thorax is unremarkable. IMPRESSION: Subsegmental atelectasis or early pneumonia in the lateral aspect of the lingula. Mild cardiomegaly without pulmonary vascular congestion or pulmonary edema. Thoracic aortic atherosclerosis. Electronically Signed   By: David  Martinique M.D.   On: 01/21/2017 17:14     ASSESSMENT AND PLAN:   83 year old male with a history of chronic kidney  disease stage II, mild dementia and bladder cancer who [facility]  1. Sepsis: Patient presents with fever and mild leukocytosis along with tachypnea Sepsis is from community acquired pneumonia Sepsis is improved.  2. Community-acquired pneumonia: Continue Rocephin and azithromycin at plans to change oral tomorrow Follow up final blood cultures  3. Generalized weakness and debility: Physical therapy consultation pending  4. Diabetes with hypoglycemia due to not eating last night All diabetic medications have been discontinued for now Diabetes nurse consultation requested.  5. Dementia: Continue Namenda   6. Hyperlipidemia: Continue Zocor   Physical therapy consultation requested for discharge planning      Management plans discussed with the patient and he is in agreement.  CODE STATUS: DO NOT RESUSCITATE   TOTAL TIME TAKING CARE OF THIS PATIENT: 30 minutes.     POSSIBLE D/C tomorrow , DEPENDING ON CLINICAL CONDITION.   Kalmen Lollar M.D on 01/22/2017 at 10:47 AM  Between 7am to 6pm - Pager - [phone] After 6pm go to [email or web address] - password [email or web address] [place] [facility]  Office  [phone]  CC: Primary care physician; Kirk Ruths, MD  Note: This dictation was prepared with Dragon dictation along with smaller phrase technology. Any transcriptional errors that result from this process are unintentional.

## 2017-01-22 NOTE — Evaluation (Signed)
Physical Therapy Evaluation Patient Details Name: Colton Tyler MRN: [id number] DOB: [birth year] Today's Date: 01/22/2017   History of Present Illness  Colton Tyler is a 83 y.o. male with a known history of bladder cancer on BCG treatment, chronic kidney disease stage II/III, mild dementia, GERD comes to the emergency room from assisted living after he was not his usual self.  He appeared very weak he slumped down while the nurse was taking his vitals brought to the emergency room was found to have fever of 102.9 tachypneic and workup showed early pneumonia.  Patient received Vanco and cefepime in the emergency room. No family present and history is not reliable as provided by patient. Pt is AOx1 at time of evaluation  Clinical Impression  Pt admitted with above diagnosis. Pt currently with functional limitations due to the deficits listed below (see PT Problem List).  Pt is modified independent with bed mobility but requires minA+1 for transfers due to initial instability. He is able to ambulate a full lap around RN station with therapist. Cues required for safe use of rolling walker. Vitals monitored throughout ambulation. No signs of respiratory distress. After initial few feet of ambulation pt is steady and only requires CGA throughout rest of distance. Pt is safe to return to [facility]. He would benefit from staff assist initially with transfers due to some instability today. Pt will benefit from PT services to address deficits in strength, balance, and mobility in order to return to full function at home.     Follow Up Recommendations Home health PT;Other (comment)(Return to ALF, may benefit from [facility] PT for balance/strength)    Equipment Recommendations  None recommended by PT;Other (comment)(Pt unable to provide any details about home equipment)    Recommendations for Other Services       Precautions / Restrictions Precautions Precautions: Fall Restrictions Weight Bearing  Restrictions: No      Mobility  Bed Mobility Overal bed mobility: Modified Independent             General bed mobility comments: HOB elevated and bed rails utilized  Transfers Overall transfer level: Needs assistance Equipment used: Rolling walker (2 wheeled) Transfers: Sit to/from Stand Sit to Stand: Min assist         General transfer comment: Pt initially unsteady with transfers and limited ambulation at EOB requiring minA+1 from therapist to stabilize. However balance improves with extended time in standing and eventually pt only requires CGA  Ambulation/Gait Ambulation/Gait assistance: Min guard Ambulation Distance (Feet): 200 Feet Assistive device: Rolling walker (2 wheeled) Gait Pattern/deviations: Decreased step length - right;Decreased step length - left Gait velocity: Decreased Gait velocity interpretation: <1.8 ft/sec, indicative of risk for recurrent falls General Gait Details: Pt able to ambulate a full lap around RN station with therapist. Cues required for safe use of rolling walker. Vitals monitored throughout ambulation. No signs of respiratory distress. After initial few feet of ambulation pt is steady and only requires CGA throughout rest of distance  Stairs            Wheelchair Mobility    Modified Rankin (Stroke Patients Only)       Balance Overall balance assessment: Needs assistance Sitting-balance support: No upper extremity supported Sitting balance-Leahy Scale: Good     Standing balance support: No upper extremity supported Standing balance-Leahy Scale: Fair Standing balance comment: Able to maintain wide stance balance without UE support but unable to place feet together or maintain without UE support  Pertinent Vitals/Pain Pain Assessment: Faces Pain Score: 0-No pain    Home Living Family/patient expects to be discharged to:: Assisted living               Home Equipment: Other  (comment)      Prior Function Level of Independence: Needs assistance   Gait / Transfers Assistance Needed: Pt unable to provide details. No family present to supplement history           Hand Dominance        Extremity/Trunk Assessment   Upper Extremity Assessment Upper Extremity Assessment: Overall WFL for tasks assessed    Lower Extremity Assessment Lower Extremity Assessment: Generalized weakness       Communication   Communication: No difficulties  Cognition Arousal/Alertness: Awake/alert Behavior During Therapy: WFL for tasks assessed/performed Overall Cognitive Status: History of cognitive impairments - at baseline                                 General Comments: Pt is AOx1 at time of PT evaluation      General Comments      Exercises     Assessment/Plan    PT Assessment Patient needs continued PT services  PT Problem List Decreased strength;Decreased balance;Decreased mobility;Decreased cognition       PT Treatment Interventions DME instruction;Gait training;Functional mobility training;Therapeutic activities;Therapeutic exercise;Balance training;Neuromuscular re-education;Cognitive remediation;Patient/family education    PT Goals (Current goals can be found in the Care Plan section)  Acute Rehab PT Goals PT Goal Formulation: Patient unable to participate in goal setting    Frequency Min 2X/week   Barriers to discharge        Co-evaluation               AM-PAC PT "6 Clicks" Daily Activity  Outcome Measure Difficulty turning over in bed (including adjusting bedclothes, sheets and blankets)?: None Difficulty moving from lying on back to sitting on the side of the bed? : None Difficulty sitting down on and standing up from a chair with arms (e.g., wheelchair, bedside commode, etc,.)?: Unable Help needed moving to and from a bed to chair (including a wheelchair)?: A Little Help needed walking in hospital room?: A  Little Help needed climbing 3-5 steps with a railing? : A Little 6 Click Score: 18    End of Session Equipment Utilized During Treatment: Gait belt Activity Tolerance: Patient tolerated treatment well Patient left: in bed;with call bell/phone within reach;with bed alarm set Nurse Communication: Other (comment)(DC recommendation communicated with care manager) PT Visit Diagnosis: Unsteadiness on feet (R26.81);Other abnormalities of gait and mobility (R26.89);Muscle weakness (generalized) (M62.81)    Time: 5638-7564 PT Time Calculation (min) (ACUTE ONLY): 24 min   Charges:   PT Evaluation $PT Eval Low Complexity: 1 Low PT Treatments $Gait Training: 8-22 mins   PT G Codes:   PT G-Codes **NOT FOR INPATIENT CLASS** Functional Assessment Tool Used: AM-PAC 6 Clicks Basic Mobility Functional Limitation: Mobility: Walking and moving around Mobility: Walking and Moving Around Current Status (P3295): At least 40 percent but less than 60 percent impaired, limited or restricted Mobility: Walking and Moving Around Goal Status [phone]): At least 20 percent but less than 40 percent impaired, limited or restricted    Phillips Grout PT, DPT    Zilpha Mcandrew 01/22/2017, 12:22 PM

## 2017-01-22 NOTE — Clinical Social Work Note (Addendum)
Clinical Social Work Assessment  Patient Details  Name: Colton Tyler MRN: [id number] Date of Birth: [birth year]  Date of referral:  01/22/17               Reason for consult:  Other (Comment Required)(from [facility] )                Permission sought to share information with:  Chartered certified accountant granted to share information::  Yes, Verbal Permission Granted  Name::      [facility]   Agency::     Relationship::     Contact Information:     Housing/Transportation Living arrangements for the past 2 months:  [employer] of Information:  Patient, Facility Patient Interpreter Needed:  None Criminal Activity/Legal Involvement Pertinent to Current Situation/Hospitalization:  No - Comment as needed Significant Relationships:  Adult Children, Spouse Lives with:  Facility Resident Do you feel safe going back to the place where you live?  Yes Need for family participation in patient care:  Yes (Comment)  Care giving concerns:  Patient is a resident at [facility] (fax: [phone]).    Social Worker assessment / plan:  Holiday representative (CSW) received consult that patient is from a facility. CSW met with patient alone at beside to discuss D/C plan. Patient was pleasant and was alert and oriented X3 however he was slow to answer questions. Patient did confirm that he lives at [facility] and walks with a walker or cane at baseline. Patient is agreeable to return to [facility]. CSW attempted to contact patient's daughter Colton Tyler however she did not answer and a voicemail was left.   CSW contacted [facility] and spoke to [employer]. Per [facility] patient uses a rollaider at baseline and is on room air. Per [place] patient's wife Colton Tyler is also at [facility] and shares a room with patient. Per [facility] patient can return to [facility] with home health when stable. CSW will continue to follow and assist as  needed.   CSW received a call back from patient's daughter Colton Tyler and she was made aware of above. Diane is agreeable for patient to [facility] and will provide transport.     Employment status:  Retired Forensic scientist:  Medicare PT Recommendations:  Home with [place] / Referral to community resources:  Other (Comment Required)(Patient will return to ALF )  Patient/Family's Response to care:  Patient is agreeable to D/C back to [facility].   Patient/Family's Understanding of and Emotional Response to Diagnosis, Current Treatment, and Prognosis:  Patient was very pleasant and thanked CSW for visit.   Emotional Assessment Appearance:  Appears stated age Attitude/Demeanor/Rapport:    Affect (typically observed):  Pleasant Orientation:  Oriented to Self, Oriented to Place, Fluctuating Orientation (Suspected and/or reported Sundowners) Alcohol / Substance use:  Not Applicable Psych involvement (Current and /or in the community):  No (Comment)  Discharge Needs  Concerns to be addressed:  Discharge Planning Concerns Readmission within the last 30 days:  No Current discharge risk:  Cognitively Impaired Barriers to Discharge:  Continued Medical Work up   [employer], Colton Beets, LCSW 01/22/2017, 4:24 PM

## 2017-01-22 NOTE — Progress Notes (Deleted)
New Admission Note:  Arrival Method:  Mental Orientation:  Assessment:  Skin:  IV:  Pain:  Safety Measures:  Admission:  1C Orientation:  Family:

## 2017-01-23 LAB — CBC
HEMATOCRIT: 29.8 % — AB (ref 40.0–52.0)
Hemoglobin: 9.5 g/dL — ABNORMAL LOW (ref 13.0–18.0)
MCH: 28.8 pg (ref 26.0–34.0)
MCHC: 31.9 g/dL — AB (ref 32.0–36.0)
MCV: 90.4 fL (ref 80.0–100.0)
Platelets: 99 10*3/uL — ABNORMAL LOW (ref 150–440)
RBC: 3.29 MIL/uL — ABNORMAL LOW (ref 4.40–5.90)
RDW: 19.7 % — AB (ref 11.5–14.5)
WBC: 4.4 10*3/uL (ref 3.8–10.6)

## 2017-01-23 LAB — BASIC METABOLIC PANEL
Anion gap: 5 (ref 5–15)
BUN: 28 mg/dL — AB (ref 6–20)
CHLORIDE: 109 mmol/L (ref 101–111)
CO2: 24 mmol/L (ref 22–32)
Calcium: 8.2 mg/dL — ABNORMAL LOW (ref 8.9–10.3)
Creatinine, Ser: 1.32 mg/dL — ABNORMAL HIGH (ref 0.61–1.24)
GFR calc Af Amer: 53 mL/min — ABNORMAL LOW (ref >60)
GFR calc non Af Amer: 46 mL/min — ABNORMAL LOW (ref >60)
GLUCOSE: 65 mg/dL (ref 65–99)
POTASSIUM: 3.6 mmol/L (ref 3.5–5.1)
Sodium: 138 mmol/L (ref 135–145)

## 2017-01-23 LAB — GLUCOSE, CAPILLARY
GLUCOSE-CAPILLARY: 196 mg/dL — AB (ref 65–99)
Glucose-Capillary: 64 mg/dL — ABNORMAL LOW (ref 65–99)
Glucose-Capillary: 72 mg/dL (ref 65–99)

## 2017-01-23 MED ORDER — CEFUROXIME AXETIL 500 MG PO TABS: 500.0000 mg | ORAL_TABLET | Freq: Two times a day (BID) | ORAL | 0 refills | Status: DC

## 2017-01-23 NOTE — Plan of Care (Signed)
  Progressing Education: Knowledge of General Education information will improve 01/23/2017 0008 - Progressing by Sonda Primes, RN Health Behavior/Discharge Planning: Ability to manage health-related needs will improve 01/23/2017 0008 - Progressing by Sonda Primes, RN Clinical Measurements: Ability to maintain clinical measurements within normal limits will improve 01/23/2017 0008 - Progressing by Sonda Primes, RN Will remain free from infection 01/23/2017 0008 - Progressing by Sonda Primes, RN Diagnostic test results will improve 01/23/2017 0008 - Progressing by Sonda Primes, RN Respiratory complications will improve 01/23/2017 0008 - Progressing by Sonda Primes, RN Cardiovascular complication will be avoided 01/23/2017 0008 - Progressing by Sonda Primes, RN Activity: Risk for activity intolerance will decrease 01/23/2017 0008 - Progressing by Sonda Primes, RN Nutrition: Adequate nutrition will be maintained 01/23/2017 0008 - Progressing by Sonda Primes, RN Elimination: Will not experience complications related to bowel motility 01/23/2017 0008 - Progressing by Sonda Primes, RN Will not experience complications related to urinary retention 01/23/2017 0008 - Progressing by Sonda Primes, RN Pain Managment: General experience of comfort will improve 01/23/2017 0008 - Progressing by Sonda Primes, RN Safety: Ability to remain free from injury will improve 01/23/2017 0008 - Progressing by Sonda Primes, RN Skin Integrity: Risk for impaired skin integrity will decrease 01/23/2017 0008 - Progressing by Sonda Primes, RN   Not Progressing Coping: Level of anxiety will decrease-pt confused to situation of why he is here and where is family is at. 01/23/2017 0008 - Not Progressing by Sonda Primes, RN

## 2017-01-23 NOTE — NC FL2 (Signed)
[place] LEVEL OF CARE SCREENING TOOL     IDENTIFICATION  Patient Name: Colton Tyler Birthdate: 19-Jun-1925 Sex: male Admission Date (Current Location): 01/21/2017  [place] and [place] Number:  Engineering geologist and Address:  [facility], [address] [place], [place] [address]      Provider Number: [id number]  Attending Physician Name and Address:  Gladstone Lighter, MD  Relative Name and Phone Number:       Current Level of Care: Hospital Recommended Level of Care: [place] Prior Approval Number:    Date Approved/Denied:   PASRR Number:    Discharge Plan: Domiciliary (Rest home)    Current Diagnoses: Patient Active Problem List   Diagnosis Date Noted  . Sepsis ([place]) 01/21/2017  . Benign neoplasm of cecum   . Angiodysplasia of stomach and duodenum with hemorrhage   . Anemia 11/02/2014  . Hematuria, gross 07/23/2014  . Bladder mass 06/09/2014  . Bladder cancer ([place]) 06/09/2014  . Hematochezia 06/09/2014  . Diabetes ([place]) 06/09/2014  . Frank hematuria 06/09/2014  . Blood in the urine 06/09/2014  . BP (high blood pressure) 06/09/2014  . Malignant neoplasm of pancreas ([place]) 06/09/2014  . Surgery follow-up examination 06/09/2014  . Type 2 diabetes mellitus with stage 3 chronic kidney disease, with long-term current use of insulin ([place]) 06/09/2014  . Bladder disease 06/09/2014  . Essential hypertension 06/09/2014  . Urinary catheter insertion/adjustment/removal 08/22/2013  . Fitting and adjustment of urinary device 08/22/2013  . Abnormal ECG 08/03/2013  . Arteriosclerosis of coronary artery 08/03/2013  . Carotid artery narrowing 08/03/2013  . Hyperlipemia 08/03/2013  . Amnesia 08/03/2013  . OP (osteoporosis) 08/03/2013  . Beat, premature ventricular 08/03/2013  . CAD (coronary artery disease) 08/03/2013  . Carotid artery disease ([place]) 08/03/2013  . Thrombocytopenia ([place]) 07/07/2013    Orientation  RESPIRATION BLADDER Height & Weight     Self, Place  Normal Incontinent Weight: 130 lb 8 oz (59.2 kg) Height:  5\' 7"  (170.2 cm)  BEHAVIORAL SYMPTOMS/MOOD NEUROLOGICAL BOWEL NUTRITION STATUS      Continent Diet: Heart Healthy  AMBULATORY STATUS COMMUNICATION OF NEEDS Skin   Limited Assist Verbally Normal                       Personal Care Assistance Level of Assistance  Bathing, Feeding, Dressing Bathing Assistance: Limited assistance Feeding assistance: Independent Dressing Assistance: Limited assistance     Functional Limitations Info  Sight, Hearing, Speech Sight Info: Impaired Hearing Info: Impaired Speech Info: Adequate    SPECIAL CARE FACTORS FREQUENCY  PT (By licensed PT)     PT Frequency: (2-3 home health. )              Contractures      Additional Factors Info  Code Status, Allergies Code Status Info: (DNR ) Allergies Info: (Penicillins)          Discharge Medications: Please see discharge summary for a list of discharge medications. Medication List     STOP taking these medications   JANUVIA 50 MG tablet Generic drug:  sitaGLIPtin   LANTUS SOLOSTAR 100 UNIT/ML Solostar Pen Generic drug:  Insulin Glargine     TAKE these medications   cefUROXime 500 MG tablet Commonly known as:  CEFTIN Take 1 tablet (500 mg total) 2 (two) times daily with a meal by mouth. X 10 days   ferrous sulfate 325 (65 FE) MG EC tablet Take 325 mg by mouth daily  with breakfast.   NAMENDA 10 MG tablet Generic drug:  memantine Take 10 mg by mouth 2 (two) times daily.   simvastatin 20 MG tablet Commonly known as:  ZOCOR Take 20 mg by mouth at bedtime.     Relevant Imaging Results: Relevant Lab Results: Additional Information (SSN: [id number])  Saide Lanuza, [place] Beets, LCSW

## 2017-01-23 NOTE — Discharge Summary (Signed)
[facility] [place] NAME: Colton Tyler    MR#:  [id number]  DATE OF BIRTH:  1925-09-05  DATE OF ADMISSION:  01/21/2017   ADMITTING PHYSICIAN: Fritzi Mandes, MD  DATE OF DISCHARGE: 01/23/2017  PRIMARY CARE PHYSICIAN: Kirk Ruths, MD   ADMISSION DIAGNOSIS:   Sepsis, due to unspecified organism [facility]) [A41.9]  DISCHARGE DIAGNOSIS:   Active Problems:   Sepsis ([place])   SECONDARY DIAGNOSIS:   Past Medical History:  Diagnosis Date  . Bladder cancer ([place])   . Chronic kidney disease   . Coronary artery disease   . Dementia   . Diabetes ([place])   . Dysrhythmia   . GERD (gastroesophageal reflux disease)   . Hematuria, gross 07/23/2014  . Hyperlipidemia   . Hypertension   . Pancreatic cancer [facility])    s/p whipple procedure    HOSPITAL COURSE:   83 year old male with past medical history significant for history of bladder cancer, CK D, CAD, mild dementia, diabetes, hypertension and history of pancreatic cancer status post Whipple's procedure is brought in from assisted living facility secondary to weakness and fevers  #1 sepsis-secondary to community-acquired pneumonia. Presented with mild leukocytosis, fever and tachypnea -Oxygen saturations have been stable. Blood cultures negative so far -On Rocephin and azithromycin in the hospital. Will be discharged on Ceftin for 10 days  #2 weakness secondary to pneumonia. Physical therapy recommended home health  #3 diabetes mellitus-with hypoglycemic episodes in the hospital. Lantus has been discontinued and hold Januvia as well. -Encouraged to do fingersticks and sliding scale insulin as needed. Follow up with PCP  #4 dementia-stable at baseline, continue Namenda  Will be discharged today with home health. Daughter updated at bedside  DISCHARGE CONDITIONS:   Guarded  CONSULTS OBTAINED:   None  DRUG ALLERGIES:   Allergies  Allergen Reactions  . Penicillins Other (See  Comments)    "unknown"   DISCHARGE MEDICATIONS:   Allergies as of 01/23/2017      Reactions   Penicillins Other (See Comments)   "unknown"      Medication List    STOP taking these medications   JANUVIA 50 MG tablet Generic drug:  sitaGLIPtin   LANTUS SOLOSTAR 100 UNIT/ML Solostar Pen Generic drug:  Insulin Glargine     TAKE these medications   cefUROXime 500 MG tablet Commonly known as:  CEFTIN Take 1 tablet (500 mg total) 2 (two) times daily with a meal by mouth. X 10 days   ferrous sulfate 325 (65 FE) MG EC tablet Take 325 mg by mouth daily with breakfast.   NAMENDA 10 MG tablet Generic drug:  memantine Take 10 mg by mouth 2 (two) times daily.   simvastatin 20 MG tablet Commonly known as:  ZOCOR Take 20 mg by mouth at bedtime.        DISCHARGE INSTRUCTIONS:   1. PCP follow-up in 1-2 weeks  DIET:   Cardiac diet  ACTIVITY:   Activity as tolerated  OXYGEN:   Home Oxygen: No.  Oxygen Delivery: room air  DISCHARGE LOCATION:   home - [place]  If you experience worsening of your admission symptoms, develop shortness of breath, life threatening emergency, suicidal or homicidal thoughts you must seek medical attention immediately by calling 911 or calling your MD immediately  if symptoms less severe.  You Must read complete instructions/literature along with all the possible adverse reactions/side effects for all the Medicines you take and that have been  prescribed to you. Take any new Medicines after you have completely understood and accpet all the possible adverse reactions/side effects.   Please note  You were cared for by a hospitalist during your hospital stay. If you have any questions about your discharge medications or the care you received while you were in the hospital after you are discharged, you can call the unit and asked to speak with the hospitalist on call if the hospitalist that took care of you is not  available. Once you are discharged, your primary care physician will handle any further medical issues. Please note that NO REFILLS for any discharge medications will be authorized once you are discharged, as it is imperative that you return to your primary care physician (or establish a relationship with a primary care physician if you do not have one) for your aftercare needs so that they can reassess your need for medications and monitor your lab values.    On the day of Discharge:  VITAL SIGNS:   Blood pressure (!) 138/59, pulse (!) 53, temperature 98.5 F (36.9 C), temperature source Oral, resp. rate 20, height 5\' 7"  (1.702 m), weight 59.2 kg (130 lb 8 oz), SpO2 95 %.  PHYSICAL EXAMINATION:    GENERAL:  83 y.o.-year-old elderly patient lying in the bed with no acute distress.  EYES: Pupils equal, round, reactive to light and accommodation. No scleral icterus. Extraocular muscles intact.  HEENT: Head atraumatic, normocephalic. Oropharynx and nasopharynx clear.  NECK:  Supple, no jugular venous distention. No thyroid enlargement, no tenderness.  LUNGS: Normal breath sounds bilaterally, no wheezing, rales,rhonchi or crepitation. No use of accessory muscles of respiration. Decreased bibasilar breath sounds CARDIOVASCULAR: S1, S2 normal. No  rubs, or gallops. 2/6 systolic murmur present ABDOMEN: Soft, non-tender, non-distended. Bowel sounds present. No organomegaly or mass.  EXTREMITIES: No pedal edema, cyanosis, or clubbing.  NEUROLOGIC: Cranial nerves II through XII are intact. Muscle strength 5/5 in all extremities. Sensation intact. Gait not checked.  PSYCHIATRIC: The patient is alert and oriented x 2-3.  SKIN: No obvious rash, lesion, or ulcer.   DATA REVIEW:   CBC Recent Labs  Lab 01/23/17 0528  WBC 4.4  HGB 9.5*  HCT 29.8*  PLT 99*    Chemistries  Recent Labs  Lab 01/21/17 1630 01/23/17 0528  NA 140 138  K 3.7 3.6  CL 106 109  CO2 23 24  GLUCOSE 115* 65  BUN 23*  28*  CREATININE 1.43* 1.32*  CALCIUM 8.6* 8.2*  AST 42*  --   ALT 30  --   ALKPHOS 127*  --   BILITOT 0.6  --      Microbiology Results  Results for orders placed or performed during the hospital encounter of 01/21/17  Blood Culture (routine x 2)     Status: None (Preliminary result)   Collection Time: 01/21/17  4:30 PM  Result Value Ref Range Status   Specimen Description BLOOD BLOOD RIGHT FOREARM  Final   Special Requests   Final    BOTTLES DRAWN AEROBIC AND ANAEROBIC Blood Culture adequate volume   Culture NO GROWTH 2 DAYS  Final   Report Status PENDING  Incomplete  Blood Culture (routine x 2)     Status: None (Preliminary result)   Collection Time: 01/21/17  4:30 PM  Result Value Ref Range Status   Specimen Description BLOOD LEFT ANTECUBITAL  Final   Special Requests   Final    BOTTLES DRAWN AEROBIC AND ANAEROBIC Blood Culture results may  not be optimal due to an excessive volume of blood received in culture bottles   Culture NO GROWTH 2 DAYS  Final   Report Status PENDING  Incomplete  Urine culture     Status: Abnormal (Preliminary result)   Collection Time: 01/21/17  4:30 PM  Result Value Ref Range Status   Specimen Description URINE, RANDOM  Final   Special Requests NONE  Final   Culture (A)  Final    20,000 COLONIES/mL STAPHYLOCOCCUS SPECIES (COAGULASE NEGATIVE) SUSCEPTIBILITIES TO FOLLOW Performed at [facility], [address], [place], [place] [address]    Report Status PENDING  Incomplete  MRSA PCR Screening     Status: None   Collection Time: 01/22/17  3:20 AM  Result Value Ref Range Status   MRSA by PCR NEGATIVE NEGATIVE Final    Comment:        The GeneXpert MRSA Assay (FDA approved for NASAL specimens only), is one component of a comprehensive MRSA colonization surveillance program. It is not intended to diagnose MRSA infection nor to guide or monitor treatment for MRSA infections.     RADIOLOGY:  No results found.   Management plans  discussed with the patient, family and they are in agreement.  CODE STATUS:     Code Status Orders  (From admission, onward)        Start     Ordered   01/21/17 2127  Do not attempt resuscitation (DNR)  Continuous    Question Answer Comment  In the event of cardiac or respiratory ARREST Do not call a "code blue"   In the event of cardiac or respiratory ARREST Do not perform Intubation, CPR, defibrillation or ACLS   In the event of cardiac or respiratory ARREST Use medication by any route, position, wound care, and other measures to relive pain and suffering. May use oxygen, suction and manual treatment of airway obstruction as needed for comfort.      01/21/17 2126    Code Status History    Date Active Date Inactive Code Status Order ID Comments User Context   07/30/2016 17:41 08/01/2016 16:54 DNR [id number]  Hillary Bow, MD Inpatient   07/30/2016 17:35 07/30/2016 17:40 DNR [id number]  Idalia Needle, RN Inpatient    Advance Directive Documentation     Most Recent Value  Type of Advance Directive  Healthcare Power of Attorney  Pre-existing out of facility DNR order (yellow form or pink MOST form)  No data  "MOST" Form in Place?  No data      TOTAL TIME TAKING CARE OF THIS PATIENT: 38 minutes.    Arshia Rondon M.D on 01/23/2017 at 1:30 PM  Between 7am to 6pm - Pager - [phone]  After 6pm go to [email or web address] - Technical brewer [facility]  Office  [phone]  CC: Primary care physician; Kirk Ruths, MD   Note: This dictation was prepared with Dragon dictation along with smaller phrase technology. Any transcriptional errors that result from this process are unintentional.

## 2017-01-23 NOTE — Progress Notes (Signed)
Patient is medically stable for D/C back to [facility]. Per Kristeen Miss at [facility] patient can return today. Patient's daughter [name] is at bedside and will transport patient. RN will call report. Clinical Education officer, museum (CSW) sent D/C summary and FL2 to [facility]. Please reconsult if future social work needs arise. CSW signing off.   [employer], LCSW [phone]

## 2017-01-23 NOTE — Care Management Note (Signed)
Case Management Note  Patient Details  Name: Colton Tyler MRN: [id number] Date of Birth: 1925-07-09  Subjective/Objective:    Referral called to Verdis Frederickson at [facility] requesting HH=PT, RN at [facility] per Mr Minardi's choice of providers. Mr Endsley reports that his wife is also receiving [place] services by [facility]. No other needs identified. Weekend CSW was notified that Mr Dyment is being discharged.                 Action/Plan:   Expected Discharge Date:                  Expected Discharge Plan:  [place]  In-House Referral:  Clinical Social Work  Discharge planning Services  CM Consult  Post Acute Care Choice:  Home Health Choice offered to:  Patient  DME Arranged:  N/A DME Agency:  NA  HH Arranged:  RN, PT [place] Agency:  [facility] (formerly [employer])  Status of Service:  Completed, signed off  If discussed at [employer], dates discussed:    Additional Comments:  Kyriaki Moder A, RN 01/23/2017, 12:28 PM

## 2017-01-23 NOTE — Progress Notes (Signed)
Pt is being discharged to [facility]. Report called. AVS given and explained to pt and pt's daughter. Daughter verbalized understanding. Meds and f/u appointment reviewed. RX given. Daughter to transport pt.

## 2017-01-23 NOTE — Progress Notes (Signed)
Prior to breakfast CBG 64. Pt asymptomatic. 4OJ oz given, CBG up to 72. Pt ate all his breakfast. Dr Tressia Miners is aware.

## 2017-01-24 LAB — URINE CULTURE

## 2017-01-25 LAB — GLUCOSE, CAPILLARY: Glucose-Capillary: 34 mg/dL — CL (ref 65–99)

## 2017-01-26 ENCOUNTER — Ambulatory Visit (INDEPENDENT_AMBULATORY_CARE_PROVIDER_SITE_OTHER): Payer: Medicare Other | Admitting: Urology

## 2017-01-26 ENCOUNTER — Encounter: Payer: Self-pay | Admitting: Urology

## 2017-01-26 VITALS — BP 173/80 | HR 53 | Temp 97.9°F | Resp 22 | Ht 67.0 in | Wt 130.5 lb

## 2017-01-26 DIAGNOSIS — C679 Malignant neoplasm of bladder, unspecified: Secondary | ICD-10-CM | POA: Diagnosis not present

## 2017-01-26 LAB — URINALYSIS, COMPLETE
Bilirubin, UA: NEGATIVE
KETONES UA: NEGATIVE
Leukocytes, UA: NEGATIVE
NITRITE UA: NEGATIVE
RBC, UA: NEGATIVE
SPEC GRAV UA: 1.015 (ref 1.005–1.030)
Urobilinogen, Ur: 0.2 mg/dL (ref 0.2–1.0)
pH, UA: 6 (ref 5.0–7.5)

## 2017-01-26 LAB — CULTURE, BLOOD (ROUTINE X 2)
CULTURE: NO GROWTH
Culture: NO GROWTH
Special Requests: ADEQUATE

## 2017-01-26 LAB — MICROSCOPIC EXAMINATION
Bacteria, UA: NONE SEEN
EPITHELIAL CELLS (NON RENAL): NONE SEEN /HPF (ref 0–10)
RBC MICROSCOPIC, UA: NONE SEEN /HPF (ref 0–?)

## 2017-01-26 MED ORDER — BCG LIVE 50 MG IS SUSR
3.2400 mL | Freq: Once | INTRAVESICAL | Status: AC
  Administered 2017-01-26: 81 mg via INTRAVESICAL

## 2017-01-26 NOTE — Progress Notes (Signed)
01/26/2017 4:50 PM   Colton Tyler 1925-06-29 [id number]  Referring provider: Kirk Ruths, MD [name] [place], [name] [address]  Chief Complaint  Patient presents with  . BCG    HPI: Patient is an 83 year old Caucasian male who presents today to begin an induction series of BCG. This will be his 4th of 6.  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    He returned to the office on 10/07/2015 at which time there are multiple lesions within the bladder concerning for carcinoma in situ with positive urine cytology.    After a recent return to the operating room for a TURBT of a recurrent lesion on 11/05/15 post operatively, he was noted to have an at least 2 cm area of patchy erythema involving the right lateral bladder wall beyond the right UO couldn't suspicious for CIS. His bladder was heavily trabeculated. There are no obvious papillary lesions.  Surgical pathology was consistent with urothelial carcinoma in situ as well as high-grade invasive disease into the lamina propria. No muscularis propria was present on the biopsies.  The suspicious area was fulgurated completely.  Most recent imaging in the form of CT urogram on 10/18/2015 shows no clear evidence of metastatic disease. He does have a small liver lesion which is somewhat atypical for metastatic disease.  Completed induction series of BCG in November 2017.    Surveillance cystoscopy completed on 04/14/2016 with [name] noted NED.    Completed maintenance series of 3 BCG's in May 2018.  Surveillance cystoscopy completed on May 2018 with [name] noted NED.     CT May 2018. There was as suspicious are on liver, but that was benign on MRI Liver   Surveillance cystoscopy completed on 11/30/2016 noted possible recurrence near the right  UO.  Underwent TURBT on 12/07/2016 with [name]. Pathology consistent with a recurrence of high-grade T1 disease.    He was admitted recently for pneumonia and was discharged in 01/23/2017.    Today, he is not having any urinary complaints. He has not had gross hematuria, dysuria or suprapubic pain. He has not had fevers, chills, nausea or vomiting.  He has not had a cough. His UA today contains 11-30 WBC's.  He was able to hold his bladder instillation for two hours.     PMH: Past Medical History:  Diagnosis Date  . Bladder cancer ([place])   . Chronic kidney disease   . Coronary artery disease   . Dementia   . Diabetes ([place])   . Dysrhythmia   . GERD (gastroesophageal reflux disease)   . Hematuria, gross 07/23/2014  . Hyperlipidemia   . Hypertension   . Pancreatic cancer [facility])    s/p whipple procedure    Surgical History: Past Surgical History:  Procedure Laterality Date  . BLADDER SURGERY    . COLONOSCOPY WITH PROPOFOL N/A 07/31/2016   Procedure: COLONOSCOPY WITH PROPOFOL;  Surgeon: [name], MD;  Location: [facility] ENDOSCOPY;  Service: Endoscopy;  Laterality: N/A;  . CYSTOSCOPY W/ RETROGRADES Bilateral 12/07/2016   Procedure: CYSTOSCOPY WITH RETROGRADE PYELOGRAM;  Surgeon: [name], MD;  Location: [facility] ORS;  Service: Urology;  Laterality: Bilateral;  . CYSTOSCOPY WITH BIOPSY N/A 11/05/2015   Procedure: CYSTOSCOPY WITH BIOPSY;  Surgeon: [name], MD;  Location: [facility] ORS;  Service: Urology;  Laterality: N/A;  . CYSTOSCOPY  WITH BIOPSY N/A 12/07/2016   Procedure: CYSTOSCOPY WITH BLADDER BIOPSY;  Surgeon: [name], MD;  Location: [facility] ORS;  Service: Urology;  Laterality: N/A;  . CYSTOSCOPY WITH FULGERATION  11/05/2015   Procedure: CYSTOSCOPY WITH FULGERATION;  Surgeon: [name], MD;  Location: [facility] ORS;  Service: Urology;;  . [name] WITH STENT PLACEMENT Right 12/07/2016   Procedure: CYSTOSCOPY WITH STENT PLACEMENT;  Surgeon: [name], MD;   Location: [facility] ORS;  Service: Urology;  Laterality: Right;  . ESOPHAGOGASTRODUODENOSCOPY (EGD) WITH PROPOFOL N/A 07/31/2016   Procedure: ESOPHAGOGASTRODUODENOSCOPY (EGD) WITH PROPOFOL;  Surgeon: [name], MD;  Location: [facility] ENDOSCOPY;  Service: Endoscopy;  Laterality: N/A;  . HERNIA REPAIR Left    Inguinal Hernia Repair  . PORTACATH PLACEMENT Right 2015   [facility]  . WHIPPLE PROCEDURE    . WHIPPLE PROCEDURE      Home Medications:  Allergies as of 01/26/2017      Reactions   Penicillins Other (See Comments)   "unknown"      Medication List        Accurate as of 01/26/17  4:50 PM. Always use your most recent med list.          cefUROXime 500 MG tablet Commonly known as:  CEFTIN Take 1 tablet (500 mg total) 2 (two) times daily with a meal by mouth. X 10 days   ferrous sulfate 325 (65 FE) MG EC tablet Take 325 mg by mouth daily with breakfast.   NAMENDA 10 MG tablet Generic drug:  memantine Take 10 mg by mouth 2 (two) times daily.   simvastatin 20 MG tablet Commonly known as:  ZOCOR Take 20 mg by mouth at bedtime.       Allergies:  Allergies  Allergen Reactions  . Penicillins Other (See Comments)    "unknown"    Family History: Family History  Problem Relation Age of Onset  . Cerebrovascular Accident Father   . Stomach cancer Sister   . Kidney disease Neg Hx   . Prostate cancer Neg Hx   . Kidney cancer Neg Hx   . Bladder Cancer Neg Hx     Social History:  reports that  has never smoked. he has never used smokeless tobacco. He reports that he does not drink alcohol or use drugs.  ROS: UROLOGY Frequent Urination?: No Hard to postpone urination?: No Burning/pain with urination?: No Get up at night to urinate?: No Leakage of urine?: No Urine stream starts and stops?: No Trouble starting stream?: No Do you have to strain to urinate?: No Blood in urine?: No Urinary tract infection?: No Sexually transmitted disease?: No Injury to kidneys or bladder?:  No Painful intercourse?: No Weak stream?: No Erection problems?: No Penile pain?: No  Gastrointestinal Nausea?: No Vomiting?: No Indigestion/heartburn?: No Diarrhea?: No Constipation?: No  Constitutional Fever: No Night sweats?: No Weight loss?: No Fatigue?: No  Skin Skin rash/lesions?: No Itching?: No  Eyes Blurred vision?: No Double vision?: No  Ears/Nose/Throat Sore throat?: No Sinus problems?: No  Hematologic/Lymphatic Swollen glands?: No Easy bruising?: No  Cardiovascular Leg swelling?: No Chest pain?: No  Respiratory Cough?: No Shortness of breath?: No  Endocrine Excessive thirst?: No  Musculoskeletal Back pain?: No Joint pain?: No  Neurological Headaches?: No Dizziness?: No  Psychologic Depression?: No Anxiety?: No  Physical Exam: BP (!) 173/80   Pulse (!) 53   Temp 97.9 F (36.6 C) (Oral)   Resp (!) 22   Ht 5\' 7"  (1.702 m)   Wt 130 lb 8  oz (59.2 kg)   SpO2 100%   BMI 20.44 kg/m   Constitutional: Well nourished. Alert and oriented, No acute distress. HEENT: [place] AT, moist mucus membranes. Trachea midline, no masses. Cardiovascular: No clubbing, cyanosis, or edema. Respiratory: Normal respiratory effort, no increased work of breathing. GI: Abdomen is soft, non tender, non distended, no abdominal masses. Liver and spleen not palpable.  No hernias appreciated.  Stool sample for occult testing is not indicated.   GU: No CVA tenderness.  No bladder fullness or masses.  Patient with circumcised phallus.   Urethral meatus is patent.  No penile discharge. No penile lesions or rashes.  Skin: No rashes, bruises or suspicious lesions. Lymph: No cervical or inguinal adenopathy. Neurologic: Grossly intact, no focal deficits, moving all 4 extremities. Psychiatric: Normal mood and affect.  Laboratory Data: Lab Results  Component Value Date   WBC 4.4 01/23/2017   HGB 9.5 (L) 01/23/2017   HCT 29.8 (L) 01/23/2017   MCV 90.4 01/23/2017   PLT  99 (L) 01/23/2017    Lab Results  Component Value Date   CREATININE 1.32 (H) 01/23/2017    Lab Results  Component Value Date   HGBA1C 7.2 (H) 07/30/2016    Lab Results  Component Value Date   TSH 2.926 01/16/2016    Lab Results  Component Value Date   AST 42 (H) 01/21/2017   Lab Results  Component Value Date   ALT 30 01/21/2017    Urinalysis Negative.  See EPIC.    I have reviewed the labs.   Assessment & Plan:    1. 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  - repeat TURBT 10/2015 for 2 cm area of patchy erythema involving the right lateral bladder wall beyond the right UO, heavytrabeculation, no obvious papillary lesions. 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  - repeat TURBT 12/2016 for a raised/thickened possible early papillary area just later to Right UO and possibly over the intramural ureter concerning for recurrence.  Surgical pathology = consistent with high-grade T1 TCC recurrence  - Reviewed BCG treatment course, possible side effects including BCG sepsis, bladder irritation, worsening of her urinary symptoms  - # 4 of 6 BCG installed today  - Patient was instructed to pour bleach down his/her toilet for the next 6 hours  -  Instructed to call the office if she should experience fevers greater than 102, chills/rigors, onset of a new cough, night sweats or further bladder spasms or inability to urinate   - RTC in one week for # 5 BCG  - Surveillance protocol also discussed today including cystoscopy every 3 months for at least 2 years and then spread out thereafter  - Urinalysis, Complete  - bcg vaccine injection 81 mg; Instill 3.24 mLs (81 mg total) into the bladder once.  - lidocaine (XYLOCAINE) 2 % jelly 1 application; Place 1 application into the  urethra once.   Return in about 1 week (around 02/02/2017) for # 5 BCG.  These notes generated with voice recognition software. I apologize for typographical errors.  Zara Council, [facility] [address] [place], [place] [address] [phone]

## 2017-02-01 NOTE — Progress Notes (Signed)
02/02/2017 4:29 PM   Colton Tyler 1925-12-25 [id number]  Referring provider: Kirk Ruths, MD [facility] [place], [place] [address]  Chief Complaint  Patient presents with  . Bladder Cancer    HPI: Patient is an 83 year old Caucasian male who presents today to begin an induction series of BCG. This will be his 5th of 6.  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    He returned to the office on 10/07/2015 at which time there are multiple lesions within the bladder concerning for carcinoma in situ with positive urine cytology.    After a recent return to the operating room for a TURBT of a recurrent lesion on 11/05/15 post operatively, he was noted to have an at least 2 cm area of patchy erythema involving the right lateral bladder wall beyond the right UO couldn't suspicious for CIS. His bladder was heavily trabeculated. There are no obvious papillary lesions.  Surgical pathology was consistent with urothelial carcinoma in situ as well as high-grade invasive disease into the lamina propria. No muscularis propria was present on the biopsies.  The suspicious area was fulgurated completely.  Most recent imaging in the form of CT urogram on 10/18/2015 shows no clear evidence of metastatic disease. He does have a small liver lesion which is somewhat atypical for metastatic disease.  Completed induction series of BCG in November 2017.    Surveillance cystoscopy completed on 04/14/2016 with [name] noted NED.    Completed maintenance series of 3 BCG's in May 2018.  Surveillance cystoscopy completed on May 2018 with [name] noted NED.     CT May 2018. There was as suspicious are on liver, but that was benign on MRI Liver   Surveillance cystoscopy completed on 11/30/2016 noted possible recurrence  near the right UO.  Underwent TURBT on 12/07/2016 with [name]. Pathology consistent with a recurrence of high-grade T1 disease.    He was admitted recently for pneumonia and was discharged in 01/23/2017.    Today, he is not having any urinary complaints. He has not had gross hematuria, dysuria or suprapubic pain. He has not had fevers, chills, nausea or vomiting.  He has not had a cough. His UA today is negative.  He was able to hold his bladder instillation for two hours.     PMH: Past Medical History:  Diagnosis Date  . Bladder cancer ([place])   . Chronic kidney disease   . Coronary artery disease   . Dementia   . Diabetes ([place])   . Dysrhythmia   . GERD (gastroesophageal reflux disease)   . Hematuria, gross 07/23/2014  . Hyperlipidemia   . Hypertension   . Pancreatic cancer [facility])    s/p whipple procedure    Surgical History: Past Surgical History:  Procedure Laterality Date  . BLADDER SURGERY    . COLONOSCOPY WITH PROPOFOL N/A 07/31/2016   Procedure: COLONOSCOPY WITH PROPOFOL;  Surgeon: [name], MD;  Location: [facility] ENDOSCOPY;  Service: Endoscopy;  Laterality: N/A;  . CYSTOSCOPY W/ RETROGRADES Bilateral 12/07/2016   Procedure: CYSTOSCOPY WITH RETROGRADE PYELOGRAM;  Surgeon: [name], MD;  Location: [facility] ORS;  Service: Urology;  Laterality: Bilateral;  . CYSTOSCOPY WITH BIOPSY N/A 11/05/2015   Procedure: CYSTOSCOPY WITH BIOPSY;  Surgeon: [name], MD;  Location: [facility] ORS;  Service: Urology;  Laterality: N/A;  . CYSTOSCOPY  WITH BIOPSY N/A 12/07/2016   Procedure: CYSTOSCOPY WITH BLADDER BIOPSY;  Surgeon: [name], MD;  Location: [facility] ORS;  Service: Urology;  Laterality: N/A;  . CYSTOSCOPY WITH FULGERATION  11/05/2015   Procedure: CYSTOSCOPY WITH FULGERATION;  Surgeon: [name], MD;  Location: [facility] ORS;  Service: Urology;;  . [name] WITH STENT PLACEMENT Right 12/07/2016   Procedure: CYSTOSCOPY WITH STENT PLACEMENT;  Surgeon: [name], MD;   Location: [facility] ORS;  Service: Urology;  Laterality: Right;  . ESOPHAGOGASTRODUODENOSCOPY (EGD) WITH PROPOFOL N/A 07/31/2016   Procedure: ESOPHAGOGASTRODUODENOSCOPY (EGD) WITH PROPOFOL;  Surgeon: [name], MD;  Location: [facility] ENDOSCOPY;  Service: Endoscopy;  Laterality: N/A;  . HERNIA REPAIR Left    Inguinal Hernia Repair  . PORTACATH PLACEMENT Right 2015   [facility]  . WHIPPLE PROCEDURE    . WHIPPLE PROCEDURE      Home Medications:  Allergies as of 02/02/2017      Reactions   Penicillins Other (See Comments)   "unknown"      Medication List        Accurate as of 02/02/17  4:29 PM. Always use your most recent med list.          cefUROXime 500 MG tablet Commonly known as:  CEFTIN Take 1 tablet (500 mg total) 2 (two) times daily with a meal by mouth. X 10 days   ferrous sulfate 325 (65 FE) MG EC tablet Take 325 mg by mouth daily with breakfast.   HUMALOG KWIKPEN 100 UNIT/ML KiwkPen Generic drug:  insulin lispro   NAMENDA 10 MG tablet Generic drug:  memantine Take 10 mg by mouth 2 (two) times daily.   simvastatin 20 MG tablet Commonly known as:  ZOCOR Take 20 mg by mouth at bedtime.       Allergies:  Allergies  Allergen Reactions  . Penicillins Other (See Comments)    "unknown"    Family History: Family History  Problem Relation Age of Onset  . Cerebrovascular Accident Father   . Stomach cancer Sister   . Kidney disease Neg Hx   . Prostate cancer Neg Hx   . Kidney cancer Neg Hx   . Bladder Cancer Neg Hx     Social History:  reports that  has never smoked. he has never used smokeless tobacco. He reports that he does not drink alcohol or use drugs.  ROS: UROLOGY Frequent Urination?: No Hard to postpone urination?: No Burning/pain with urination?: No Get up at night to urinate?: No Leakage of urine?: No Urine stream starts and stops?: No Trouble starting stream?: No Do you have to strain to urinate?: No Blood in urine?: No Urinary tract infection?:  No Sexually transmitted disease?: No Injury to kidneys or bladder?: No Painful intercourse?: No Weak stream?: No Erection problems?: No Penile pain?: No  Gastrointestinal Nausea?: No Vomiting?: No Indigestion/heartburn?: No Diarrhea?: No Constipation?: No  Constitutional Fever: No Night sweats?: No Weight loss?: No Fatigue?: No  Skin Skin rash/lesions?: No Itching?: No  Eyes Blurred vision?: No Double vision?: No  Ears/Nose/Throat Sore throat?: No Sinus problems?: No  Hematologic/Lymphatic Swollen glands?: No Easy bruising?: No  Cardiovascular Leg swelling?: No Chest pain?: No  Respiratory Cough?: No Shortness of breath?: No  Endocrine Excessive thirst?: No  Musculoskeletal Back pain?: No Joint pain?: No  Neurological Headaches?: No Dizziness?: No  Psychologic Depression?: No Anxiety?: No  Physical Exam: BP (!) 189/84 (BP Location: Right Arm, Patient Position: Sitting, Cuff Size: Normal)   Pulse 73   Ht 5\' 7"  (  1.702 m)   Wt 132 lb (59.9 kg)   BMI 20.67 kg/m   Constitutional: Well nourished. Alert and oriented, No acute distress. HEENT: [place] AT, moist mucus membranes. Trachea midline, no masses. Cardiovascular: No clubbing, cyanosis, or edema. Respiratory: Normal respiratory effort, no increased work of breathing. GI: Abdomen is soft, non tender, non distended, no abdominal masses. Liver and spleen not palpable.  No hernias appreciated.  Stool sample for occult testing is not indicated.   GU: No CVA tenderness.  No bladder fullness or masses.  Patient with circumcised phallus.   Urethral meatus is patent.  No penile discharge. No penile lesions or rashes.  Skin: No rashes, bruises or suspicious lesions. Lymph: No cervical or inguinal adenopathy. Neurologic: Grossly intact, no focal deficits, moving all 4 extremities. Psychiatric: Normal mood and affect.  Laboratory Data: Lab Results  Component Value Date   WBC 4.4 01/23/2017   HGB 9.5  (L) 01/23/2017   HCT 29.8 (L) 01/23/2017   MCV 90.4 01/23/2017   PLT 99 (L) 01/23/2017    Lab Results  Component Value Date   CREATININE 1.32 (H) 01/23/2017    Lab Results  Component Value Date   HGBA1C 7.2 (H) 07/30/2016    Lab Results  Component Value Date   TSH 2.926 01/16/2016    Lab Results  Component Value Date   AST 42 (H) 01/21/2017   Lab Results  Component Value Date   ALT 30 01/21/2017    Urinalysis Negative.  See EPIC.    I have reviewed the labs.   Assessment & Plan:    1. 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  - repeat TURBT 10/2015 for 2 cm area of patchy erythema involving the right lateral bladder wall beyond the right UO, heavytrabeculation, no obvious papillary lesions. 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  - repeat TURBT 12/2016 for a raised/thickened possible early papillary area just later to Right UO and possibly over the intramural ureter concerning for recurrence.  Surgical pathology = consistent with high-grade T1 TCC recurrence  - Reviewed BCG treatment course, possible side effects including BCG sepsis, bladder irritation, worsening of her urinary symptoms  - # 5 of 6 BCG installed today  - Patient was instructed to pour bleach down his/her toilet for the next 6 hours  -  Instructed to call the office if she should experience fevers greater than 102, chills/rigors, onset of a new cough, night sweats or further bladder spasms or inability to urinate   - RTC in one week for # 6 BCG  - Surveillance protocol also discussed today including cystoscopy every 3 months for at least 2 years and then spread out thereafter  - Urinalysis, Complete  - bcg vaccine injection 81 mg; Instill 3.24 mLs (81 mg total) into the bladder once.  -  lidocaine (XYLOCAINE) 2 % jelly 1 application; Place 1 application into the urethra once.   Return in about 1 week (around 02/09/2017) for # 6 BCG.  These notes generated with voice recognition software. I apologize for typographical errors.  Zara Council, [facility] [address], [place], [place] [address] [phone]

## 2017-02-02 ENCOUNTER — Ambulatory Visit (INDEPENDENT_AMBULATORY_CARE_PROVIDER_SITE_OTHER): Payer: Medicare Other | Admitting: Urology

## 2017-02-02 ENCOUNTER — Encounter: Payer: Self-pay | Admitting: Urology

## 2017-02-02 VITALS — BP 189/84 | HR 73 | Ht 67.0 in | Wt 132.0 lb

## 2017-02-02 DIAGNOSIS — C679 Malignant neoplasm of bladder, unspecified: Secondary | ICD-10-CM | POA: Diagnosis not present

## 2017-02-02 MED ORDER — BCG LIVE 50 MG IS SUSR
3.2400 mL | Freq: Once | INTRAVESICAL | Status: AC
  Administered 2017-02-02: 81 mg via INTRAVESICAL

## 2017-02-02 NOTE — Progress Notes (Signed)
BCG Bladder Instillation  BCG # 5  Due to Bladder Cancer patient is present today for a BCG treatment. Patient was cleaned and prepped in a sterile fashion with betadine and lidocaine 2% jelly was instilled into the urethra.  A 14FR catheter was inserted, urine return was noted 157ml, urine was yellow in color.  51ml of reconstituted BCG was instilled into the bladder. The catheter was then removed. Patient tolerated well, no complications were noted  Preformed by: Zara Council PA-C, Elberta Leatherwood, CMA  Follow up/ Additional notes: 1 week #6

## 2017-02-03 LAB — URINALYSIS, COMPLETE
Bilirubin, UA: NEGATIVE
Glucose, UA: NEGATIVE
Ketones, UA: NEGATIVE
Nitrite, UA: NEGATIVE
Specific Gravity, UA: 1.02 (ref 1.005–1.030)
Urobilinogen, Ur: 0.2 mg/dL (ref 0.2–1.0)
pH, UA: 5.5 (ref 5.0–7.5)

## 2017-02-03 LAB — MICROSCOPIC EXAMINATION: Epithelial Cells (non renal): NONE SEEN /HPF (ref 0–10)

## 2017-02-08 NOTE — Progress Notes (Signed)
02/09/2017 4:42 PM   Colton Tyler 23-Dec-1925 [id number]  Referring provider: Kirk Ruths, MD [place] [facility] [place], [place] [address]  Chief Complaint  Patient presents with  . Bladder Cancer    HPI: Patient is an 83 year old Caucasian male who presents today to begin an induction series of BCG. This will be his 6th of 6.  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    He returned to the office on 10/07/2015 at which time there are multiple lesions within the bladder concerning for carcinoma in situ with positive urine cytology.    After a recent return to the operating room for a TURBT of a recurrent lesion on 11/05/15 post operatively, he was noted to have an at least 2 cm area of patchy erythema involving the right lateral bladder wall beyond the right UO couldn't suspicious for CIS. His bladder was heavily trabeculated. There are no obvious papillary lesions.  Surgical pathology was consistent with urothelial carcinoma in situ as well as high-grade invasive disease into the lamina propria. No muscularis propria was present on the biopsies.  The suspicious area was fulgurated completely.  Most recent imaging in the form of CT urogram on 10/18/2015 shows no clear evidence of metastatic disease. He does have a small liver lesion which is somewhat atypical for metastatic disease.  Completed induction series of BCG in November 2017.    Surveillance cystoscopy completed on 04/14/2016 with [name] noted NED.    Completed maintenance series of 3 BCG's in May 2018.  Surveillance cystoscopy completed on May 2018 with [name] noted NED.     CT May 2018. There was as suspicious are on liver, but that was benign on MRI Liver   Surveillance cystoscopy completed on 11/30/2016 noted possible recurrence  near the right UO.  Underwent TURBT on 12/07/2016 with [name]. Pathology consistent with a recurrence of high-grade T1 disease.    He was admitted recently for pneumonia and was discharged in 01/23/2017.    Today, he is not having any urinary complaints. He has not had gross hematuria, dysuria or suprapubic pain. He has not had fevers, chills, nausea or vomiting.  He has not had a cough. His UA today is negative.  He was able to hold his bladder instillation for two hours.     PMH: Past Medical History:  Diagnosis Date  . Bladder cancer ([place])   . Chronic kidney disease   . Coronary artery disease   . Dementia   . Diabetes ([place])   . Dysrhythmia   . GERD (gastroesophageal reflux disease)   . Hematuria, gross 07/23/2014  . Hyperlipidemia   . Hypertension   . Pancreatic cancer [facility])    s/p whipple procedure    Surgical History: Past Surgical History:  Procedure Laterality Date  . BLADDER SURGERY    . COLONOSCOPY WITH PROPOFOL N/A 07/31/2016   Procedure: COLONOSCOPY WITH PROPOFOL;  Surgeon: [name], MD;  Location: [facility] ENDOSCOPY;  Service: Endoscopy;  Laterality: N/A;  . CYSTOSCOPY W/ RETROGRADES Bilateral 12/07/2016   Procedure: CYSTOSCOPY WITH RETROGRADE PYELOGRAM;  Surgeon: [name], MD;  Location: [facility] ORS;  Service: Urology;  Laterality: Bilateral;  . CYSTOSCOPY WITH BIOPSY N/A 11/05/2015   Procedure: CYSTOSCOPY WITH BIOPSY;  Surgeon: [name], MD;  Location: [facility] ORS;  Service: Urology;  Laterality: N/A;  . CYSTOSCOPY  WITH BIOPSY N/A 12/07/2016   Procedure: CYSTOSCOPY WITH BLADDER BIOPSY;  Surgeon: [name], MD;  Location: [facility] ORS;  Service: Urology;  Laterality: N/A;  . CYSTOSCOPY WITH FULGERATION  11/05/2015   Procedure: CYSTOSCOPY WITH FULGERATION;  Surgeon: [name], MD;  Location: [facility] ORS;  Service: Urology;;  . [name] WITH STENT PLACEMENT Right 12/07/2016   Procedure: CYSTOSCOPY WITH STENT PLACEMENT;  Surgeon: [name], MD;   Location: [facility] ORS;  Service: Urology;  Laterality: Right;  . ESOPHAGOGASTRODUODENOSCOPY (EGD) WITH PROPOFOL N/A 07/31/2016   Procedure: ESOPHAGOGASTRODUODENOSCOPY (EGD) WITH PROPOFOL;  Surgeon: [name], MD;  Location: [facility] ENDOSCOPY;  Service: Endoscopy;  Laterality: N/A;  . HERNIA REPAIR Left    Inguinal Hernia Repair  . PORTACATH PLACEMENT Right 2015   [facility]  . WHIPPLE PROCEDURE    . WHIPPLE PROCEDURE      Home Medications:  Allergies as of 02/09/2017      Reactions   Penicillins Other (See Comments)   "unknown"      Medication List        Accurate as of 02/09/17  4:42 PM. Always use your most recent med list.          cefUROXime 500 MG tablet Commonly known as:  CEFTIN Take 1 tablet (500 mg total) 2 (two) times daily with a meal by mouth. X 10 days   ferrous sulfate 325 (65 FE) MG EC tablet Take 325 mg by mouth daily with breakfast.   HUMALOG KWIKPEN 100 UNIT/ML KiwkPen Generic drug:  insulin lispro   NAMENDA 10 MG tablet Generic drug:  memantine Take 10 mg by mouth 2 (two) times daily.   simvastatin 20 MG tablet Commonly known as:  ZOCOR Take 20 mg by mouth at bedtime.       Allergies:  Allergies  Allergen Reactions  . Penicillins Other (See Comments)    "unknown"    Family History: Family History  Problem Relation Age of Onset  . Cerebrovascular Accident Father   . Stomach cancer Sister   . Kidney disease Neg Hx   . Prostate cancer Neg Hx   . Kidney cancer Neg Hx   . Bladder Cancer Neg Hx     Social History:  reports that  has never smoked. he has never used smokeless tobacco. He reports that he does not drink alcohol or use drugs.  ROS: UROLOGY Frequent Urination?: No Hard to postpone urination?: No Burning/pain with urination?: No Get up at night to urinate?: No Leakage of urine?: No Urine stream starts and stops?: No Trouble starting stream?: No Do you have to strain to urinate?: No Blood in urine?: No Urinary tract infection?:  No Sexually transmitted disease?: No Injury to kidneys or bladder?: No Painful intercourse?: No Weak stream?: No Erection problems?: No Penile pain?: No  Gastrointestinal Nausea?: No Vomiting?: No Indigestion/heartburn?: No Diarrhea?: No Constipation?: No  Constitutional Fever: No Night sweats?: No Weight loss?: No Fatigue?: No  Skin Skin rash/lesions?: No Itching?: No  Eyes Blurred vision?: No Double vision?: No  Ears/Nose/Throat Sore throat?: No Sinus problems?: No  Hematologic/Lymphatic Swollen glands?: No Easy bruising?: No  Cardiovascular Leg swelling?: No Chest pain?: No  Respiratory Cough?: No Shortness of breath?: No  Endocrine Excessive thirst?: No  Musculoskeletal Back pain?: No Joint pain?: No  Neurological Headaches?: No Dizziness?: No  Psychologic Depression?: No Anxiety?: No  Physical Exam: BP (!) 198/87   Pulse (!) 59   Ht 5\' 7"  (1.702 m)   Wt 129 lb 11.2 oz (  58.8 kg)   BMI 20.31 kg/m   Constitutional: Well nourished. Alert and oriented, No acute distress. HEENT: [place] AT, moist mucus membranes. Trachea midline, no masses. Cardiovascular: No clubbing, cyanosis, or edema. Respiratory: Normal respiratory effort, no increased work of breathing. GI: Abdomen is soft, non tender, non distended, no abdominal masses. Liver and spleen not palpable.  No hernias appreciated.  Stool sample for occult testing is not indicated.   GU: No CVA tenderness.  No bladder fullness or masses.  Patient with circumcised phallus.   Urethral meatus is patent.  No penile discharge. No penile lesions or rashes.  Skin: No rashes, bruises or suspicious lesions. Lymph: No cervical or inguinal adenopathy. Neurologic: Grossly intact, no focal deficits, moving all 4 extremities. Psychiatric: Normal mood and affect.  Laboratory Data: Lab Results  Component Value Date   WBC 4.4 01/23/2017   HGB 9.5 (L) 01/23/2017   HCT 29.8 (L) 01/23/2017   MCV 90.4  01/23/2017   PLT 99 (L) 01/23/2017    Lab Results  Component Value Date   CREATININE 1.32 (H) 01/23/2017    Lab Results  Component Value Date   HGBA1C 7.2 (H) 07/30/2016    Lab Results  Component Value Date   TSH 2.926 01/16/2016    Lab Results  Component Value Date   AST 42 (H) 01/21/2017   Lab Results  Component Value Date   ALT 30 01/21/2017    Urinalysis Negative.  See EPIC.    I have reviewed the labs.   Assessment & Plan:    1. 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  - repeat TURBT 10/2015 for 2 cm area of patchy erythema involving the right lateral bladder wall beyond the right UO, heavytrabeculation, no obvious papillary lesions. 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  - repeat TURBT 12/2016 for a raised/thickened possible early papillary area just later to Right UO and possibly over the intramural ureter concerning for recurrence.  Surgical pathology = consistent with high-grade T1 TCC recurrence  - Reviewed BCG treatment course, possible side effects including BCG sepsis, bladder irritation, worsening of her urinary symptoms  - # 6 of 6 BCG installed today  - Patient was instructed to pour bleach down his/her toilet for the next 6 hours  -  Instructed to call the office if she should experience fevers greater than 102, chills/rigors, onset of a new cough, night sweats or further bladder spasms or inability to urinate   - RTC on 05/11/2017 for surveillance cystoscopy  - Surveillance protocol also discussed today including cystoscopy every 3 months for at least 2 years and then spread out thereafter  - Urinalysis, Complete  - bcg vaccine injection 81 mg; Instill 3.24 mLs (81 mg total) into the bladder once.  - lidocaine (XYLOCAINE) 2 % jelly 1  application; Place 1 application into the urethra once.   Return for 05/11/2017 for surveillance cystoscopy.  These notes generated with voice recognition software. I apologize for typographical errors.  Zara Council, [facility] [address] [place], [place] [address] [phone]

## 2017-02-09 ENCOUNTER — Ambulatory Visit (INDEPENDENT_AMBULATORY_CARE_PROVIDER_SITE_OTHER): Payer: Medicare Other | Admitting: Urology

## 2017-02-09 ENCOUNTER — Encounter: Payer: Self-pay | Admitting: Urology

## 2017-02-09 VITALS — BP 198/87 | HR 59 | Ht 67.0 in | Wt 129.7 lb

## 2017-02-09 DIAGNOSIS — C679 Malignant neoplasm of bladder, unspecified: Secondary | ICD-10-CM | POA: Diagnosis not present

## 2017-02-09 LAB — MICROSCOPIC EXAMINATION
BACTERIA UA: NONE SEEN
EPITHELIAL CELLS (NON RENAL): NONE SEEN /HPF (ref 0–10)

## 2017-02-09 LAB — URINALYSIS, COMPLETE
Bilirubin, UA: NEGATIVE
Ketones, UA: NEGATIVE
LEUKOCYTES UA: NEGATIVE
Nitrite, UA: NEGATIVE
PH UA: 6 (ref 5.0–7.5)
Protein, UA: NEGATIVE
SPEC GRAV UA: 1.01 (ref 1.005–1.030)
Urobilinogen, Ur: 0.2 mg/dL (ref 0.2–1.0)

## 2017-02-09 MED ORDER — BCG LIVE 50 MG IS SUSR: 3.2400 mL | Freq: Once | INTRAVESICAL | Status: DC

## 2017-02-09 NOTE — Progress Notes (Signed)
BCG Bladder Instillation  BCG # 6  Due to Bladder Cancer patient is present today for a BCG treatment. Patient was cleaned and prepped in a sterile fashion with betadine and lidocaine 2% jelly was instilled into the urethra.  A 14FR catheter was inserted, urine return was noted 40ml, urine was pale yellow in color.  90ml of reconstituted BCG was instilled into the bladder. The catheter was then removed. Patient tolerated well, no complications were noted  Performed by: Reece Packer, RN

## 2017-02-22 ENCOUNTER — Inpatient Hospital Stay: Payer: Medicare Other | Attending: Hematology and Oncology

## 2017-02-22 DIAGNOSIS — C672 Malignant neoplasm of lateral wall of bladder: Secondary | ICD-10-CM | POA: Insufficient documentation

## 2017-02-22 DIAGNOSIS — Z452 Encounter for adjustment and management of vascular access device: Secondary | ICD-10-CM | POA: Diagnosis not present

## 2017-02-22 DIAGNOSIS — Z95828 Presence of other vascular implants and grafts: Secondary | ICD-10-CM

## 2017-02-22 MED ORDER — HEPARIN SOD (PORK) LOCK FLUSH 100 UNIT/ML IV SOLN
500.0000 [IU] | Freq: Once | INTRAVENOUS | Status: AC
  Administered 2017-02-22: 500 [IU] via INTRAVENOUS
  Filled 2017-02-22: qty 5

## 2017-02-22 MED ORDER — SODIUM CHLORIDE 0.9% FLUSH
10.0000 mL | INTRAVENOUS | Status: DC | PRN
  Administered 2017-02-22: 10 mL via INTRAVENOUS
  Filled 2017-02-22: qty 10

## 2017-03-04 ENCOUNTER — Other Ambulatory Visit: Payer: Self-pay

## 2017-03-04 ENCOUNTER — Encounter: Payer: Self-pay | Admitting: Emergency Medicine

## 2017-03-04 ENCOUNTER — Emergency Department
Admission: EM | Admit: 2017-03-04 | Discharge: 2017-03-04 | Disposition: A | Discharge status: Home / Self Care | Payer: Medicare Other | Attending: Emergency Medicine | Admitting: Emergency Medicine

## 2017-03-04 DIAGNOSIS — I129 Hypertensive chronic kidney disease with stage 1 through stage 4 chronic kidney disease, or unspecified chronic kidney disease: Secondary | ICD-10-CM | POA: Diagnosis not present

## 2017-03-04 DIAGNOSIS — E11649 Type 2 diabetes mellitus with hypoglycemia without coma: Secondary | ICD-10-CM | POA: Diagnosis not present

## 2017-03-04 DIAGNOSIS — Z8507 Personal history of malignant neoplasm of pancreas: Secondary | ICD-10-CM | POA: Diagnosis not present

## 2017-03-04 DIAGNOSIS — I2510 Atherosclerotic heart disease of native coronary artery without angina pectoris: Secondary | ICD-10-CM | POA: Insufficient documentation

## 2017-03-04 DIAGNOSIS — N189 Chronic kidney disease, unspecified: Secondary | ICD-10-CM | POA: Insufficient documentation

## 2017-03-04 DIAGNOSIS — F0390 Unspecified dementia without behavioral disturbance: Secondary | ICD-10-CM | POA: Insufficient documentation

## 2017-03-04 DIAGNOSIS — C672 Malignant neoplasm of lateral wall of bladder: Secondary | ICD-10-CM | POA: Diagnosis not present

## 2017-03-04 DIAGNOSIS — E162 Hypoglycemia, unspecified: Secondary | ICD-10-CM | POA: Diagnosis present

## 2017-03-04 DIAGNOSIS — Z794 Long term (current) use of insulin: Secondary | ICD-10-CM | POA: Insufficient documentation

## 2017-03-04 DIAGNOSIS — Z8551 Personal history of malignant neoplasm of bladder: Secondary | ICD-10-CM | POA: Diagnosis not present

## 2017-03-04 LAB — URINALYSIS, COMPLETE (UACMP) WITH MICROSCOPIC
Bacteria, UA: NONE SEEN
Bilirubin Urine: NEGATIVE
GLUCOSE, UA: NEGATIVE mg/dL
Hgb urine dipstick: NEGATIVE
KETONES UR: NEGATIVE mg/dL
Leukocytes, UA: NEGATIVE
Nitrite: NEGATIVE
PH: 5 (ref 5.0–8.0)
PROTEIN: NEGATIVE mg/dL
Specific Gravity, Urine: 1.011 (ref 1.005–1.030)

## 2017-03-04 LAB — CBC WITH DIFFERENTIAL/PLATELET
Basophils Absolute: 0 10*3/uL (ref 0–0.1)
Basophils Relative: 0 %
Eosinophils Absolute: 0 10*3/uL (ref 0–0.7)
Eosinophils Relative: 0 %
HEMATOCRIT: 38.1 % — AB (ref 40.0–52.0)
HEMOGLOBIN: 12.2 g/dL — AB (ref 13.0–18.0)
LYMPHS ABS: 0.3 10*3/uL — AB (ref 1.0–3.6)
LYMPHS PCT: 5 %
MCH: 29.4 pg (ref 26.0–34.0)
MCHC: 32.1 g/dL (ref 32.0–36.0)
MCV: 91.7 fL (ref 80.0–100.0)
Monocytes Absolute: 0.8 10*3/uL (ref 0.2–1.0)
Monocytes Relative: 11 %
NEUTROS ABS: 6.1 10*3/uL (ref 1.4–6.5)
NEUTROS PCT: 84 %
Platelets: 166 10*3/uL (ref 150–440)
RBC: 4.15 MIL/uL — AB (ref 4.40–5.90)
RDW: 17.9 % — ABNORMAL HIGH (ref 11.5–14.5)
WBC: 7.2 10*3/uL (ref 3.8–10.6)

## 2017-03-04 LAB — COMPREHENSIVE METABOLIC PANEL
ALK PHOS: 136 U/L — AB (ref 38–126)
ALT: 35 U/L (ref 17–63)
AST: 44 U/L — ABNORMAL HIGH (ref 15–41)
Albumin: 3.8 g/dL (ref 3.5–5.0)
Anion gap: 8 (ref 5–15)
BUN: 20 mg/dL (ref 6–20)
CALCIUM: 8.9 mg/dL (ref 8.9–10.3)
CO2: 26 mmol/L (ref 22–32)
CREATININE: 1.42 mg/dL — AB (ref 0.61–1.24)
Chloride: 107 mmol/L (ref 101–111)
GFR, EST AFRICAN AMERICAN: 49 mL/min — AB (ref >60)
GFR, EST NON AFRICAN AMERICAN: 42 mL/min — AB (ref >60)
Glucose, Bld: 130 mg/dL — ABNORMAL HIGH (ref 65–99)
Potassium: 4 mmol/L (ref 3.5–5.1)
Sodium: 141 mmol/L (ref 135–145)
Total Bilirubin: 0.7 mg/dL (ref 0.3–1.2)
Total Protein: 6.7 g/dL (ref 6.5–8.1)

## 2017-03-04 LAB — GLUCOSE, CAPILLARY
GLUCOSE-CAPILLARY: 202 mg/dL — AB (ref 65–99)
Glucose-Capillary: 82 mg/dL (ref 65–99)

## 2017-03-04 LAB — TROPONIN I

## 2017-03-04 MED ORDER — INSULIN GLARGINE 100 UNIT/ML ~~LOC~~ SOLN: 15.0000 [IU] | Freq: Every day | SUBCUTANEOUS | 1 refills | Status: DC

## 2017-03-04 NOTE — ED Triage Notes (Signed)
Pt arrived via EMS from [facility] for reports of being found during morning rounds with CBG of 38. EMS reports gave orange juice and CBG increased to 94. EMS reports 140/80, a-fib on monitor. EMS reports became more alert during transport.

## 2017-03-04 NOTE — ED Notes (Signed)
POC CBG 82.

## 2017-03-04 NOTE — ED Provider Notes (Signed)
[facility] Emergency Department Provider Note       Time seen: ----------------------------------------- 7:27 AM on 03/04/2017 -----------------------------------------   I have reviewed the triage vital signs and the nursing notes.  HISTORY   Chief Complaint Hypoglycemia    HPI Colton Tyler is a 83 y.o. male with a history of dementia, diabetes, hyperlipidemia and hypertension who presents to the ED for hypoglycemia.  Patient arrives via EMS from assisted living facility for low blood sugar.  EMS gave the patient orange juice and sugar improved to 94.  Patient denies any complaints at this time.  Patient cannot remember if he ate last night.  Past Medical History:  Diagnosis Date  . Bladder cancer ([address])   . Chronic kidney disease   . Coronary artery disease   . Dementia   . Diabetes ([place])   . Dysrhythmia   . GERD (gastroesophageal reflux disease)   . Hematuria, gross 07/23/2014  . Hyperlipidemia   . Hypertension   . Pancreatic cancer [facility])    s/p whipple procedure    Patient Active Problem List   Diagnosis Date Noted  . Sepsis ([place]) 01/21/2017  . Benign neoplasm of cecum   . Angiodysplasia of stomach and duodenum with hemorrhage   . Anemia 11/02/2014  . Hematuria, gross 07/23/2014  . Bladder mass 06/09/2014  . Bladder cancer ([place]) 06/09/2014  . Hematochezia 06/09/2014  . Diabetes ([place]) 06/09/2014  . Frank hematuria 06/09/2014  . Blood in the urine 06/09/2014  . BP (high blood pressure) 06/09/2014  . Malignant neoplasm of pancreas ([place]) 06/09/2014  . Surgery follow-up examination 06/09/2014  . Type 2 diabetes mellitus with stage 3 chronic kidney disease, with long-term current use of insulin ([place]) 06/09/2014  . Bladder disease 06/09/2014  . Essential hypertension 06/09/2014  . Urinary catheter insertion/adjustment/removal 08/22/2013  . Fitting and adjustment of urinary device 08/22/2013  . Abnormal ECG 08/03/2013  .  Arteriosclerosis of coronary artery 08/03/2013  . Carotid artery narrowing 08/03/2013  . Hyperlipemia 08/03/2013  . Amnesia 08/03/2013  . OP (osteoporosis) 08/03/2013  . Beat, premature ventricular 08/03/2013  . CAD (coronary artery disease) 08/03/2013  . Carotid artery disease ([place]) 08/03/2013  . Thrombocytopenia ([place]) 07/07/2013    Past Surgical History:  Procedure Laterality Date  . BLADDER SURGERY    . COLONOSCOPY WITH PROPOFOL N/A 07/31/2016   Procedure: COLONOSCOPY WITH PROPOFOL;  Surgeon: [name], MD;  Location: [facility] ENDOSCOPY;  Service: Endoscopy;  Laterality: N/A;  . CYSTOSCOPY W/ RETROGRADES Bilateral 12/07/2016   Procedure: CYSTOSCOPY WITH RETROGRADE PYELOGRAM;  Surgeon: [name], MD;  Location: [facility] ORS;  Service: Urology;  Laterality: Bilateral;  . CYSTOSCOPY WITH BIOPSY N/A 11/05/2015   Procedure: CYSTOSCOPY WITH BIOPSY;  Surgeon: [name], MD;  Location: [facility] ORS;  Service: Urology;  Laterality: N/A;  . CYSTOSCOPY WITH BIOPSY N/A 12/07/2016   Procedure: CYSTOSCOPY WITH BLADDER BIOPSY;  Surgeon: [name], MD;  Location: [facility] ORS;  Service: Urology;  Laterality: N/A;  . CYSTOSCOPY WITH FULGERATION  11/05/2015   Procedure: CYSTOSCOPY WITH FULGERATION;  Surgeon: [name], MD;  Location: [facility] ORS;  Service: Urology;;  . [name] WITH STENT PLACEMENT Right 12/07/2016   Procedure: CYSTOSCOPY WITH STENT PLACEMENT;  Surgeon: [name], MD;  Location: [facility] ORS;  Service: Urology;  Laterality: Right;  . ESOPHAGOGASTRODUODENOSCOPY (EGD) WITH PROPOFOL N/A 07/31/2016   Procedure: ESOPHAGOGASTRODUODENOSCOPY (EGD) WITH PROPOFOL;  Surgeon: [name], MD;  Location: [facility] ENDOSCOPY;  Service: Endoscopy;  Laterality: N/A;  . HERNIA REPAIR Left  Inguinal Hernia Repair  . PORTACATH PLACEMENT Right 2015   [facility]  . WHIPPLE PROCEDURE    . WHIPPLE PROCEDURE      Allergies Penicillins  Social History Social History   Tobacco Use  . Smoking status:  Never Smoker  . Smokeless tobacco: Never Used  Substance Use Topics  . Alcohol use: No    Alcohol/week: 0.0 oz  . Drug use: No    Review of Systems Constitutional: Negative for fever. Cardiovascular: Negative for chest pain. Respiratory: Negative for shortness of breath. Gastrointestinal: Negative for abdominal pain, vomiting and diarrhea. Musculoskeletal: Negative for back pain. Skin: Negative for rash. Neurological: Negative for headaches, focal weakness or numbness.  All systems negative/normal/unremarkable except as stated in the HPI  ____________________________________________   PHYSICAL EXAM:  VITAL SIGNS: ED Triage Vitals  Enc Vitals Group     BP 03/04/17 0724 (!) 152/104     Pulse --      Resp 03/04/17 0724 20     Temp 03/04/17 0724 97.8 F (36.6 C)     Temp Source 03/04/17 0724 Oral     SpO2 03/04/17 0724 100 %     Weight 03/04/17 0721 129 lb (58.5 kg)     Height --      Head Circumference --      Peak Flow --      Pain Score --      Pain Loc --      Pain Edu? --      Excl. in [place]? --     Constitutional: Alert but disoriented.  Well appearing and in no distress. Eyes: Conjunctivae are normal. Normal extraocular movements. ENT   Head: Normocephalic and atraumatic.   Nose: No congestion/rhinnorhea.   Mouth/Throat: Mucous membranes are moist.   Neck: No stridor. Cardiovascular: Normal rate, irregular rhythm. No murmurs, rubs, or gallops. Respiratory: Normal respiratory effort without tachypnea nor retractions. Breath sounds are clear and equal bilaterally. No wheezes/rales/rhonchi. Gastrointestinal: Soft and nontender. Normal bowel sounds Musculoskeletal: Nontender with normal range of motion in extremities. No lower extremity tenderness nor edema. Neurologic:  Normal speech and language. No gross focal neurologic deficits are appreciated.  Skin:  Skin is warm, dry and intact. No rash noted. Psychiatric: Mood and affect are normal. Speech and  behavior are normal.  ____________________________________________  EKG: Interpreted by me.  Sinus rhythm the rate is 70 bpm, prolonged PR interval, wide QRS, trigeminy is noted.  ____________________________________________  ED COURSE:  As part of my medical decision making, I reviewed the following data within the [place] History obtained from family if available, nursing notes, old chart and ekg, as well as notes from prior ED visits. Patient presented for hypoglycemia, we will assess with labs as indicated at this time.   Procedures ____________________________________________   LABS (pertinent positives/negatives)  Labs Reviewed  CBC WITH DIFFERENTIAL/PLATELET - Abnormal; Notable for the following components:      Result Value   RBC 4.15 (*)    Hemoglobin 12.2 (*)    HCT 38.1 (*)    RDW 17.9 (*)    Lymphs Abs 0.3 (*)    All other components within normal limits  COMPREHENSIVE METABOLIC PANEL - Abnormal; Notable for the following components:   Glucose, Bld 130 (*)    Creatinine, Ser 1.42 (*)    AST 44 (*)    Alkaline Phosphatase 136 (*)    GFR calc non Af Amer 42 (*)    GFR calc Af Amer 49 (*)  All other components within normal limits  URINALYSIS, COMPLETE (UACMP) WITH MICROSCOPIC - Abnormal; Notable for the following components:   Color, Urine YELLOW (*)    APPearance CLEAR (*)    Squamous Epithelial / LPF 0-5 (*)    All other components within normal limits  TROPONIN I  GLUCOSE, CAPILLARY  CBG MONITORING, ED   ____________________________________________  DIFFERENTIAL DIAGNOSIS   Hypoglycemia, occult infection, poor diet, medication error, dehydration, electrolyte abnormality  FINAL ASSESSMENT AND PLAN  Hypoglycemia   Plan: Patient had presented for low blood sugar at [place]. Patient's labs were within normal limits for him.  No specific etiology of her hypoglycemia has been found.  I will decrease his night Lantus from 20  units down to 15.  Overall he appears stable for outpatient follow-up.Earleen Newport, MD   Note: This note was generated in part or whole with voice recognition software. Voice recognition is usually quite accurate but there are transcription errors that can and very often do occur. I apologize for any typographical errors that were not detected and corrected.     Earleen Newport, MD 03/04/17 [phone]

## 2017-03-04 NOTE — ED Notes (Signed)
E-signature box not working. Pt and daughter verbalized understanding of discharge instructions and denied questions.

## 2017-03-05 ENCOUNTER — Ambulatory Visit
Admission: EM | Admit: 2017-03-05 | Discharge: 2017-03-05 | Disposition: A | Discharge status: Home / Self Care | Payer: Medicare Other | Attending: Family Medicine | Admitting: Family Medicine

## 2017-03-05 ENCOUNTER — Other Ambulatory Visit: Payer: Self-pay

## 2017-03-05 ENCOUNTER — Encounter: Payer: Self-pay | Admitting: Emergency Medicine

## 2017-03-05 ENCOUNTER — Ambulatory Visit: Payer: Medicare Other

## 2017-03-05 DIAGNOSIS — W19XXXA Unspecified fall, initial encounter: Secondary | ICD-10-CM | POA: Insufficient documentation

## 2017-03-05 DIAGNOSIS — S82001A Unspecified fracture of right patella, initial encounter for closed fracture: Secondary | ICD-10-CM

## 2017-03-05 DIAGNOSIS — M25561 Pain in right knee: Secondary | ICD-10-CM

## 2017-03-05 DIAGNOSIS — M85861 Other specified disorders of bone density and structure, right lower leg: Secondary | ICD-10-CM | POA: Diagnosis not present

## 2017-03-05 DIAGNOSIS — S82031A Displaced transverse fracture of right patella, initial encounter for closed fracture: Secondary | ICD-10-CM | POA: Insufficient documentation

## 2017-03-05 MED ORDER — HYDROCODONE-ACETAMINOPHEN 5-325 MG PO TABS: ORAL_TABLET | ORAL | 0 refills | Status: DC

## 2017-03-05 NOTE — Discharge Instructions (Signed)
Follow up with Orthopedist next week

## 2017-03-05 NOTE — ED Provider Notes (Signed)
[facility]    CSN: [id number] Arrival date & time: 03/05/17  1009     History   Chief Complaint Chief Complaint  Patient presents with  . Knee Pain    HPI Colton Tyler is a 84 y.o. male.   The history is provided by the patient.  Knee Pain  Location:  Knee Time since incident:  3 days Injury: yes   Mechanism of injury: fall   Fall:    Fall occurred:  Walking   Impact surface:  Chief Strategy Officer of impact:  Knees   Entrapped after fall: no   Knee location:  R knee Pain details:    Quality:  Aching   Radiates to:  Does not radiate   Severity:  Moderate   Onset quality:  Sudden   Timing:  Constant Chronicity:  New Dislocation: no   Foreign body present:  No foreign bodies Prior injury to area:  No Relieved by:  Immobilization Worsened by:  Bearing weight Associated symptoms: decreased ROM and swelling     Past Medical History:  Diagnosis Date  . Bladder cancer ([place])   . Chronic kidney disease   . Coronary artery disease   . Dementia   . Diabetes ([place])   . Dysrhythmia   . GERD (gastroesophageal reflux disease)   . Hematuria, gross 07/23/2014  . Hyperlipidemia   . Hypertension   . Pancreatic cancer [facility])    s/p whipple procedure    Patient Active Problem List   Diagnosis Date Noted  . Sepsis ([place]) 01/21/2017  . Benign neoplasm of cecum   . Angiodysplasia of stomach and duodenum with hemorrhage   . Anemia 11/02/2014  . Hematuria, gross 07/23/2014  . Bladder mass 06/09/2014  . Bladder cancer ([place]) 06/09/2014  . Hematochezia 06/09/2014  . Diabetes ([employer]) 06/09/2014  . Frank hematuria 06/09/2014  . Blood in the urine 06/09/2014  . BP (high blood pressure) 06/09/2014  . Malignant neoplasm of pancreas ([place]) 06/09/2014  . Surgery follow-up examination 06/09/2014  . Type 2 diabetes mellitus with stage 3 chronic kidney disease, with long-term current use of insulin ([place]) 06/09/2014  . Bladder disease 06/09/2014  . Essential hypertension  06/09/2014  . Urinary catheter insertion/adjustment/removal 08/22/2013  . Fitting and adjustment of urinary device 08/22/2013  . Abnormal ECG 08/03/2013  . Arteriosclerosis of coronary artery 08/03/2013  . Carotid artery narrowing 08/03/2013  . Hyperlipemia 08/03/2013  . Amnesia 08/03/2013  . OP (osteoporosis) 08/03/2013  . Beat, premature ventricular 08/03/2013  . CAD (coronary artery disease) 08/03/2013  . Carotid artery disease ([place]) 08/03/2013  . Thrombocytopenia ([place]) 07/07/2013    Past Surgical History:  Procedure Laterality Date  . BLADDER SURGERY    . COLONOSCOPY WITH PROPOFOL N/A 07/31/2016   Procedure: COLONOSCOPY WITH PROPOFOL;  Surgeon: [name], MD;  Location: [facility] ENDOSCOPY;  Service: Endoscopy;  Laterality: N/A;  . CYSTOSCOPY W/ RETROGRADES Bilateral 12/07/2016   Procedure: CYSTOSCOPY WITH RETROGRADE PYELOGRAM;  Surgeon: [name], MD;  Location: [facility] ORS;  Service: Urology;  Laterality: Bilateral;  . CYSTOSCOPY WITH BIOPSY N/A 11/05/2015   Procedure: CYSTOSCOPY WITH BIOPSY;  Surgeon: [name], MD;  Location: [facility] ORS;  Service: Urology;  Laterality: N/A;  . CYSTOSCOPY WITH BIOPSY N/A 12/07/2016   Procedure: CYSTOSCOPY WITH BLADDER BIOPSY;  Surgeon: [name], MD;  Location: [facility] ORS;  Service: Urology;  Laterality: N/A;  . CYSTOSCOPY WITH FULGERATION  11/05/2015   Procedure: CYSTOSCOPY WITH FULGERATION;  Surgeon: [name], MD;  Location: [facility]  ORS;  Service: Urology;;  . [name] WITH STENT PLACEMENT Right 12/07/2016   Procedure: CYSTOSCOPY WITH STENT PLACEMENT;  Surgeon: [name], MD;  Location: [facility] ORS;  Service: Urology;  Laterality: Right;  . ESOPHAGOGASTRODUODENOSCOPY (EGD) WITH PROPOFOL N/A 07/31/2016   Procedure: ESOPHAGOGASTRODUODENOSCOPY (EGD) WITH PROPOFOL;  Surgeon: [name], MD;  Location: [facility] ENDOSCOPY;  Service: Endoscopy;  Laterality: N/A;  . HERNIA REPAIR Left    Inguinal Hernia Repair  . PORTACATH PLACEMENT Right  2015   [facility]  . WHIPPLE PROCEDURE    . WHIPPLE PROCEDURE         Home Medications    Prior to Admission medications   Medication Sig Start Date End Date Taking? Authorizing Provider  ferrous sulfate 325 (65 FE) MG EC tablet Take 325 mg by mouth daily with breakfast.   Yes [provider]  insulin glargine (LANTUS) 100 UNIT/ML injection Inject 0.15 mLs (15 Units total) into the skin at bedtime. 03/04/17  Yes Earleen Newport, MD  memantine (NAMENDA) 10 MG tablet Take 10 mg by mouth 2 (two) times daily.  02/12/15  Yes [provider]  simvastatin (ZOCOR) 20 MG tablet Take 20 mg by mouth at bedtime.  02/12/15  Yes [provider]  cefUROXime (CEFTIN) 500 MG tablet Take 1 tablet (500 mg total) 2 (two) times daily with a meal by mouth. X 10 days Patient not taking: Reported on 03/04/2017 01/23/17   Gladstone Lighter, MD  HYDROcodone-acetaminophen (NORCO/VICODIN) 5-325 MG tablet 1 tab po bid prn 03/05/17   Norval Gable, MD    Family History Family History  Problem Relation Age of Onset  . Cerebrovascular Accident Father   . Stomach cancer Sister   . Kidney disease Neg Hx   . Prostate cancer Neg Hx   . Kidney cancer Neg Hx   . Bladder Cancer Neg Hx     Social History Social History   Tobacco Use  . Smoking status: Never Smoker  . Smokeless tobacco: Never Used  Substance Use Topics  . Alcohol use: No    Alcohol/week: 0.0 oz  . Drug use: No     Allergies   Penicillins   Review of Systems Review of Systems   Physical Exam Triage Vital Signs ED Triage Vitals  Enc Vitals Group     BP 03/05/17 1022 (!) 145/87     Pulse Rate 03/05/17 1022 84     Resp 03/05/17 1022 16     Temp 03/05/17 1022 98 F (36.7 C)     Temp Source 03/05/17 1022 Oral     SpO2 03/05/17 1022 98 %     Weight 03/05/17 1022 130 lb (59 kg)     Height 03/05/17 1022 5\' 7"  (1.702 m)     Head Circumference --      Peak Flow --      Pain Score 03/05/17 1023 9     Pain  Loc --      Pain Edu? --      Excl. in [place]? --    No data found.  Updated Vital Signs BP (!) 145/87 (BP Location: Left Arm)   Pulse 84   Temp 98 F (36.7 C) (Oral)   Resp 16   Ht 5\' 7"  (1.702 m)   Wt 130 lb (59 kg)   SpO2 98%   BMI 20.36 kg/m   Visual Acuity Right Eye Distance:   Left Eye Distance:   Bilateral Distance:    Right Eye Near:   Left  Eye Near:    Bilateral Near:     Physical Exam  Constitutional: He appears well-developed and well-nourished. No distress.  Musculoskeletal:       Right knee: He exhibits decreased range of motion, swelling and bony tenderness. He exhibits no laceration. Tenderness found. Patellar tendon tenderness noted.  Skin: He is not diaphoretic.  Nursing note and vitals reviewed.    UC Treatments / Results  Labs (all labs ordered are listed, but only abnormal results are displayed) Labs Reviewed - No data to display  EKG  EKG Interpretation None       Radiology Dg Knee Complete 4 Views Right  Result Date: 03/05/2017 CLINICAL DATA:  Right knee pain secondary to a fall on 03/02/2017. EXAM: RIGHT KNEE - COMPLETE 4+ VIEW COMPARISON:  None. FINDINGS: There is a transverse fracture through the mid patella with minimal distraction. There is prominent overlying soft tissue swelling. Joint effusion. There is diffuse osteopenia. IMPRESSION: Transverse fracture of the patella with minimal distraction. Diffuse osteopenia. Electronically Signed   By: Lorriane Shire M.D.   On: 03/05/2017 11:10    Procedures Procedures (including critical care time)  Medications Ordered in UC Medications - No data to display   Initial Impression / Assessment and Plan / UC Course  I have reviewed the triage vital signs and the nursing notes.  Pertinent labs & imaging results that were available during my care of the patient were reviewed by me and considered in my medical decision making (see chart for details).       Final Clinical Impressions(s) /  UC Diagnoses   Final diagnoses:  Closed nondisplaced fracture of right patella, unspecified fracture morphology, initial encounter    ED Discharge Orders        Ordered    HYDROcodone-acetaminophen (NORCO/VICODIN) 5-325 MG tablet     03/05/17 1140     1. x-ray results and diagnosis reviewed with patient and family member 2. Right knee immobilized with knee immobilizer 3. rx as per orders above; reviewed possible side effects, interactions, risks and benefits  4. Recommend supportive treatment with rest, elevation 5. Follow-up with orthopedist in next 2-3 days   Controlled Substance Prescriptions  Controlled Substance Registry consulted? Not Applicable   Norval Gable, MD 03/05/17 1212

## 2017-03-05 NOTE — ED Triage Notes (Signed)
Patient in today with his daughter c/o right knee pain s/p fall 03/02/17.

## 2017-03-13 ENCOUNTER — Emergency Department
Admission: EM | Admit: 2017-03-13 | Discharge: 2017-03-13 | Disposition: A | Discharge status: Home / Self Care | Attending: Emergency Medicine | Admitting: Emergency Medicine

## 2017-03-13 ENCOUNTER — Emergency Department

## 2017-03-13 DIAGNOSIS — S0990XA Unspecified injury of head, initial encounter: Secondary | ICD-10-CM | POA: Diagnosis present

## 2017-03-13 DIAGNOSIS — S0081XA Abrasion of other part of head, initial encounter: Secondary | ICD-10-CM | POA: Diagnosis not present

## 2017-03-13 DIAGNOSIS — I2510 Atherosclerotic heart disease of native coronary artery without angina pectoris: Secondary | ICD-10-CM | POA: Insufficient documentation

## 2017-03-13 DIAGNOSIS — I129 Hypertensive chronic kidney disease with stage 1 through stage 4 chronic kidney disease, or unspecified chronic kidney disease: Secondary | ICD-10-CM | POA: Insufficient documentation

## 2017-03-13 DIAGNOSIS — Y999 Unspecified external cause status: Secondary | ICD-10-CM | POA: Diagnosis not present

## 2017-03-13 DIAGNOSIS — Y92128 Other place in nursing home as the place of occurrence of the external cause: Secondary | ICD-10-CM | POA: Insufficient documentation

## 2017-03-13 DIAGNOSIS — Z794 Long term (current) use of insulin: Secondary | ICD-10-CM | POA: Diagnosis not present

## 2017-03-13 DIAGNOSIS — Z79899 Other long term (current) drug therapy: Secondary | ICD-10-CM | POA: Insufficient documentation

## 2017-03-13 DIAGNOSIS — F0390 Unspecified dementia without behavioral disturbance: Secondary | ICD-10-CM | POA: Diagnosis not present

## 2017-03-13 DIAGNOSIS — W010XXA Fall on same level from slipping, tripping and stumbling without subsequent striking against object, initial encounter: Secondary | ICD-10-CM | POA: Insufficient documentation

## 2017-03-13 DIAGNOSIS — N183 Chronic kidney disease, stage 3 (moderate): Secondary | ICD-10-CM | POA: Diagnosis not present

## 2017-03-13 DIAGNOSIS — E1122 Type 2 diabetes mellitus with diabetic chronic kidney disease: Secondary | ICD-10-CM | POA: Diagnosis not present

## 2017-03-13 DIAGNOSIS — I10 Essential (primary) hypertension: Secondary | ICD-10-CM

## 2017-03-13 DIAGNOSIS — Y9301 Activity, walking, marching and hiking: Secondary | ICD-10-CM | POA: Diagnosis not present

## 2017-03-13 LAB — CBC WITH DIFFERENTIAL/PLATELET
BASOS ABS: 0 10*3/uL (ref 0–0.1)
Basophils Relative: 0 %
Eosinophils Absolute: 0 10*3/uL (ref 0–0.7)
Eosinophils Relative: 1 %
HEMATOCRIT: 39.3 % — AB (ref 40.0–52.0)
HEMOGLOBIN: 12.7 g/dL — AB (ref 13.0–18.0)
Lymphocytes Relative: 6 %
Lymphs Abs: 0.4 10*3/uL — ABNORMAL LOW (ref 1.0–3.6)
MCH: 30 pg (ref 26.0–34.0)
MCHC: 32.2 g/dL (ref 32.0–36.0)
MCV: 93.2 fL (ref 80.0–100.0)
MONOS PCT: 11 %
Monocytes Absolute: 0.7 10*3/uL (ref 0.2–1.0)
NEUTROS ABS: 5.2 10*3/uL (ref 1.4–6.5)
NEUTROS PCT: 82 %
Platelets: 179 10*3/uL (ref 150–440)
RBC: 4.21 MIL/uL — AB (ref 4.40–5.90)
RDW: 16.6 % — ABNORMAL HIGH (ref 11.5–14.5)
WBC: 6.4 10*3/uL (ref 3.8–10.6)

## 2017-03-13 LAB — URINALYSIS, COMPLETE (UACMP) WITH MICROSCOPIC
BILIRUBIN URINE: NEGATIVE
Bacteria, UA: NONE SEEN
GLUCOSE, UA: 50 mg/dL — AB
HGB URINE DIPSTICK: NEGATIVE
Ketones, ur: NEGATIVE mg/dL
LEUKOCYTES UA: NEGATIVE
NITRITE: NEGATIVE
PROTEIN: NEGATIVE mg/dL
RBC / HPF: NONE SEEN RBC/hpf (ref 0–5)
Specific Gravity, Urine: 1.011 (ref 1.005–1.030)
Squamous Epithelial / LPF: NONE SEEN
pH: 5 (ref 5.0–8.0)

## 2017-03-13 LAB — BASIC METABOLIC PANEL
ANION GAP: 7 (ref 5–15)
BUN: 28 mg/dL — ABNORMAL HIGH (ref 6–20)
CALCIUM: 8.7 mg/dL — AB (ref 8.9–10.3)
CHLORIDE: 102 mmol/L (ref 101–111)
CO2: 26 mmol/L (ref 22–32)
Creatinine, Ser: 1.63 mg/dL — ABNORMAL HIGH (ref 0.61–1.24)
GFR calc non Af Amer: 36 mL/min — ABNORMAL LOW (ref >60)
GFR, EST AFRICAN AMERICAN: 41 mL/min — AB (ref >60)
Glucose, Bld: 235 mg/dL — ABNORMAL HIGH (ref 65–99)
Potassium: 4.1 mmol/L (ref 3.5–5.1)
SODIUM: 135 mmol/L (ref 135–145)

## 2017-03-13 LAB — TROPONIN I

## 2017-03-13 MED ORDER — CLONIDINE HCL 0.1 MG PO TABS
0.1000 mg | ORAL_TABLET | Freq: Once | ORAL | Status: AC
  Administered 2017-03-13: 0.1 mg via ORAL
  Filled 2017-03-13: qty 1

## 2017-03-13 MED ORDER — CLONIDINE HCL 0.1 MG PO TABS: 0.2000 mg | ORAL_TABLET | Freq: Once | ORAL | Status: DC

## 2017-03-13 NOTE — Discharge Instructions (Addendum)
Colton Tyler should have his blood pressure checked at least twice daily for the next several days.    He should be returned to the emergency department for evaluation if he has sustained blood pressures higher than 180/100, or any recurrent confusion, severe headache, chest pain or difficulty breathing, or any other new or worsening symptoms that are concerning to you.  He should be followed by his regular doctor within the next week.  He should have a repeat EKG within 2-4 weeks to assess for arrhythmia.

## 2017-03-13 NOTE — ED Triage Notes (Signed)
Pt brought in by [facility].  Pt was found outside in parking lot of [facility] where he is a resident.  Pt fell face first, unwitnessed.  Per pt he lost his balance.  Per facility, pt is at baseline mentation and has hx of dementia.  Pt is alert & oriented to self only at this time.

## 2017-03-13 NOTE — ED Provider Notes (Signed)
[facility] Emergency Department Provider Note ____________________________________________   First MD Initiated Contact with Patient 03/13/17 1607     (approximate)  I have reviewed the triage vital signs and the nursing notes.   HISTORY  Chief Complaint Fall and Facial Injury  History of present illness limited due to dementia.  HPI Colton Tyler is a 84 y.o. male with past medical history as noted below who presents with facial injury, acute onset within an hour prior to arrival after a fall from standing height.  The fall was not witnessed by family members.  The patient does not remember exactly how he fell.  Per the family, the patient recently had a fracture of his right patella and is in a brace; they state that he is not supposed to walk around much but he apparently tried to leave his assisted living and was walking in the parking lot when he fell.  They believe that he may have had a mechanical fall.  Per the family, the patient is somewhat more confused than at baseline.  The patient himself is without complaint at this time.  He denies any pain.  Past Medical History:  Diagnosis Date  . Bladder cancer ([place])   . Chronic kidney disease   . Coronary artery disease   . Dementia   . Diabetes ([place])   . Dysrhythmia   . GERD (gastroesophageal reflux disease)   . Hematuria, gross 07/23/2014  . Hyperlipidemia   . Hypertension   . Pancreatic cancer [facility])    s/p whipple procedure    Patient Active Problem List   Diagnosis Date Noted  . Sepsis ([place]) 01/21/2017  . Benign neoplasm of cecum   . Angiodysplasia of stomach and duodenum with hemorrhage   . Anemia 11/02/2014  . Hematuria, gross 07/23/2014  . Bladder mass 06/09/2014  . Bladder cancer ([place]) 06/09/2014  . Hematochezia 06/09/2014  . Diabetes ([place]) 06/09/2014  . Frank hematuria 06/09/2014  . Blood in the urine 06/09/2014  . BP (high blood pressure) 06/09/2014  . Malignant neoplasm  of pancreas ([place]) 06/09/2014  . Surgery follow-up examination 06/09/2014  . Type 2 diabetes mellitus with stage 3 chronic kidney disease, with long-term current use of insulin ([place]) 06/09/2014  . Bladder disease 06/09/2014  . Essential hypertension 06/09/2014  . Urinary catheter insertion/adjustment/removal 08/22/2013  . Fitting and adjustment of urinary device 08/22/2013  . Abnormal ECG 08/03/2013  . Arteriosclerosis of coronary artery 08/03/2013  . Carotid artery narrowing 08/03/2013  . Hyperlipemia 08/03/2013  . Amnesia 08/03/2013  . OP (osteoporosis) 08/03/2013  . Beat, premature ventricular 08/03/2013  . CAD (coronary artery disease) 08/03/2013  . Carotid artery disease ([place]) 08/03/2013  . Thrombocytopenia ([place]) 07/07/2013    Past Surgical History:  Procedure Laterality Date  . BLADDER SURGERY    . COLONOSCOPY WITH PROPOFOL N/A 07/31/2016   Procedure: COLONOSCOPY WITH PROPOFOL;  Surgeon: [name], MD;  Location: [facility] ENDOSCOPY;  Service: Endoscopy;  Laterality: N/A;  . CYSTOSCOPY W/ RETROGRADES Bilateral 12/07/2016   Procedure: CYSTOSCOPY WITH RETROGRADE PYELOGRAM;  Surgeon: [name], MD;  Location: [facility] ORS;  Service: Urology;  Laterality: Bilateral;  . CYSTOSCOPY WITH BIOPSY N/A 11/05/2015   Procedure: CYSTOSCOPY WITH BIOPSY;  Surgeon: [name], MD;  Location: [facility] ORS;  Service: Urology;  Laterality: N/A;  . CYSTOSCOPY WITH BIOPSY N/A 12/07/2016   Procedure: CYSTOSCOPY WITH BLADDER BIOPSY;  Surgeon: [name], MD;  Location: [facility] ORS;  Service: Urology;  Laterality: N/A;  . CYSTOSCOPY WITH  FULGERATION  11/05/2015   Procedure: CYSTOSCOPY WITH FULGERATION;  Surgeon: [name], MD;  Location: [facility] ORS;  Service: Urology;;  . [name] WITH STENT PLACEMENT Right 12/07/2016   Procedure: CYSTOSCOPY WITH STENT PLACEMENT;  Surgeon: [name], MD;  Location: [facility] ORS;  Service: Urology;  Laterality: Right;  . ESOPHAGOGASTRODUODENOSCOPY (EGD) WITH PROPOFOL  N/A 07/31/2016   Procedure: ESOPHAGOGASTRODUODENOSCOPY (EGD) WITH PROPOFOL;  Surgeon: [name], MD;  Location: [facility] ENDOSCOPY;  Service: Endoscopy;  Laterality: N/A;  . HERNIA REPAIR Left    Inguinal Hernia Repair  . PORTACATH PLACEMENT Right 2015   [facility]  . WHIPPLE PROCEDURE    . WHIPPLE PROCEDURE      Prior to Admission medications   Medication Sig Start Date End Date Taking? Authorizing Provider  cefUROXime (CEFTIN) 500 MG tablet Take 1 tablet (500 mg total) 2 (two) times daily with a meal by mouth. X 10 days Patient not taking: Reported on 03/04/2017 01/23/17   Gladstone Lighter, MD  ferrous sulfate 325 (65 FE) MG EC tablet Take 325 mg by mouth daily with breakfast.    [provider]  HYDROcodone-acetaminophen (NORCO/VICODIN) 5-325 MG tablet 1 tab po bid prn 03/05/17   Norval Gable, MD  insulin glargine (LANTUS) 100 UNIT/ML injection Inject 0.15 mLs (15 Units total) into the skin at bedtime. 03/04/17   Earleen Newport, MD  memantine (NAMENDA) 10 MG tablet Take 10 mg by mouth 2 (two) times daily.  02/12/15   [provider]  simvastatin (ZOCOR) 20 MG tablet Take 20 mg by mouth at bedtime.  02/12/15   [provider]    Allergies Penicillins  Family History  Problem Relation Age of Onset  . Cerebrovascular Accident Father   . Stomach cancer Sister   . Kidney disease Neg Hx   . Prostate cancer Neg Hx   . Kidney cancer Neg Hx   . Bladder Cancer Neg Hx     Social History Social History   Tobacco Use  . Smoking status: Never Smoker  . Smokeless tobacco: Never Used  Substance Use Topics  . Alcohol use: No    Alcohol/week: 0.0 oz  . Drug use: No    Review of Systems Level V caveat: Unable to obtain review of systems due to dementia and possible altered mental status.    ____________________________________________   PHYSICAL EXAM:  VITAL SIGNS: ED Triage Vitals  Enc Vitals Group     BP 03/13/17 1441 (!) 192/77     Pulse Rate  03/13/17 1441 65     Resp 03/13/17 1441 18     Temp 03/13/17 1441 99.7 F (37.6 C)     Temp Source 03/13/17 1441 Oral     SpO2 03/13/17 1441 94 %     Weight 03/13/17 1442 130 lb (59 kg)     Height --      Head Circumference --      Peak Flow --      Pain Score --      Pain Loc --      Pain Edu? --      Excl. in [place]? --     Constitutional: Patient laying with eyes closed but responds immediately when spoken to and appears alert. Oriented x1. Eyes: Conjunctivae are normal.  Small pupils, reactive bilaterally. Head: Multiple superficial facial abrasions; no open lacerations or bony tenderness. Nose: No congestion/rhinnorhea. Mouth/Throat: Mucous membranes are dry.   Neck: Normal range of motion.  No cervical spine midline tenderness. Cardiovascular: Normal rate,  irregular rhythm. Grossly normal heart sounds.  Good peripheral circulation. Respiratory: Normal respiratory effort.  No retractions. Lungs CTAB. Gastrointestinal: Soft and nontender. No distention.  Genitourinary: No flank tenderness. Musculoskeletal: No lower extremity edema.  Extremities warm and well perfused.  Neurologic:  Normal speech and language. No gross focal neurologic deficits are appreciated.  Motor and sensory intact in all extremities. Skin:  Skin is warm and dry. No rash noted. Psychiatric: Speech and behavior are normal.  ____________________________________________   LABS (all labs ordered are listed, but only abnormal results are displayed)  Labs Reviewed  BASIC METABOLIC PANEL - Abnormal; Notable for the following components:      Result Value   Glucose, Bld 235 (*)    BUN 28 (*)    Creatinine, Ser 1.63 (*)    Calcium 8.7 (*)    GFR calc non Af Amer 36 (*)    GFR calc Af Amer 41 (*)    All other components within normal limits  CBC WITH DIFFERENTIAL/PLATELET - Abnormal; Notable for the following components:   RBC 4.21 (*)    Hemoglobin 12.7 (*)    HCT 39.3 (*)    RDW 16.6 (*)    Lymphs Abs  0.4 (*)    All other components within normal limits  URINALYSIS, COMPLETE (UACMP) WITH MICROSCOPIC - Abnormal; Notable for the following components:   Color, Urine YELLOW (*)    APPearance CLEAR (*)    Glucose, UA 50 (*)    All other components within normal limits  TROPONIN I   ____________________________________________  EKG  ED ECG REPORT I, Arta Silence, the attending physician, personally viewed and interpreted this ECG.  Date: 03/13/2017 EKG Time: 1435 Rate: 79 Rhythm: Sinus rhythm with PVCs and possible short period of second-degree AV block type I QRS Axis: normal Intervals: Left anterior fascicular block ST/T Wave abnormalities: Nonspecific Narrative Interpretation: no evidence of acute ischemia; no significant change when compared to EKG of 08/01/16 or 03/05/17  ED ECG REPORT I, Arta Silence, the attending physician, personally viewed and interpreted this ECG.  Date: 03/13/2017 EKG Time: 1636 Rate: 75 Rhythm: Sinus rhythm with frequent PVCs; no evidence of AV block QRS Axis: normal Intervals: Left anterior fascicular block ST/T Wave abnormalities: Nonspecific  Narrative Interpretation: no evidence of acute ischemia; no significant change when compared to EKG of earlier today.  ED ECG REPORT I, Arta Silence, the attending physician, personally viewed and interpreted this ECG.  Date: 03/13/2017 EKG Time: 1853 Rate: 63 Rhythm: Sinus rhythm with frequent PVCs and possible short period of second-degree AV block type I QRS Axis: normal Intervals: Left anterior fascicular block ST/T Wave abnormalities: Nonspecific Narrative Interpretation: no evidence of acute ischemia    ____________________________________________  RADIOLOGY  CT head: No ICH or other acute findings CT maxillofacial: Remote right orbital fracture, but no acute findings CT cervical spine: No acute C-spine  fracture  ____________________________________________   PROCEDURES  Procedure(s) performed: No    Critical Care performed: No ____________________________________________   INITIAL IMPRESSION / ASSESSMENT AND PLAN / ED COURSE  Pertinent labs & imaging results that were available during my care of the patient were reviewed by me and considered in my medical decision making (see chart for details).  84 year old male with past medical history as noted above presents with a fall which was apparently mechanical in from standing height; the patient had a recent knee fracture and is in a brace and is not supposed to be walking around.  Due to his dementia  he does not specifically remember the event.  Family members present state that patient appears somewhat more confused than his baseline, although he is alert and answering questions appropriately.  On exam, there are facial abrasions, but no bony tenderness.  Neuro exam is nonfocal.  The remainder the exam is unremarkable.  He is significantly hypertensive.  I reviewed the past medical records in Epic; the patient was admitted to the hospital approximately 6 weeks ago for sepsis due to community-acquired pneumonia.  He was seen in the ED on 03/04/2017 for hypoglycemia and discharged.  He was diagnosed with a right patella fracture at urgent care on 12/28.  1.  Fall: Patient has no significant trauma on exam except for the facial abrasions.  Due to his age we will obtain CT head and C-spine, and maxillofacial to rule out facial fractures.  Suspect that the fall was likely mechanical although we cannot confirm this patient does not clearly remember it due to dementia.  2.  Possible altered mental status: No ICH to explain change in mental status.  Concern for possible hypertensive encephalopathy, versus other possible causes such as dehydration or other metabolic etiology, or infection.  Will obtain infectious and AMS workup.  3.  Cardiovascular: Patient is significant hypertensive here.  We will give p.o. clonidine and consider IV meds if necessary.  I will obtain labs to rule out endorgan dysfunction.  The patient's EKG appears consistent with prior; he has frequent PVCs.  His initial EKG here showed one run that appeared somewhat like second-degree AV block type I, however repeat EKG does not demonstrate this again, and there is no acute intervention for this.  I will continue to observe with a repeat EKG once his blood pressure is better controlled.  ----------------------------------------- 7:00 PM on 03/13/2017 -----------------------------------------  On reassessment, the patient is now fully awake and alert and at his baseline mental status per his family member.  The patient's blood pressure is markedly improved after the clonidine.  His imaging is negative for acute findings.  The lab workup is unremarkable.  Repeat EKG shows no new findings.  There is another possible very brief episode of second-degree type I AV block however the overall morphology is identical to his prior EKGs.  Given that the patient is asymptomatic in terms of any kind of palpitations, hypotension, or other cardiac symptoms, there is no indication for acute treatment for this benign arrhythmia if it is in fact present.  I counseled the patient's family member on the possibility that he had such an arrhythmia, and recommended that the patient have a repeat EKG done in 1-2 weeks to verify if it is still there.  At this time given that the patient only had hypertension after his facial injury, and is otherwise not chronically on antihypertensives, and especially given his age, comorbidities, and the fact that he is on hospice, there is no indication to start the patient on antihypertensives from the emergency department.  I gave instructions for the facility to check the blood pressure at least twice a day and the patient's family nurse states that she  will have his visiting doctor will follow up on his blood pressure readings.  The patient does not require admission for hypertension given that he is now stable and it was easily controlled with a single dose of medication.  The workup is negative for other acute findings.  The patient's family member feels comfortable with him going to his current level of care.  I gave  thorough return precautions and explained the discharge instructions, and the patient's family member expressed understanding.     ____________________________________________   FINAL CLINICAL IMPRESSION(S) / ED DIAGNOSES  Final diagnoses:  Minor head injury, initial encounter  Abrasion of face, initial encounter  Hypertension, unspecified type      NEW MEDICATIONS STARTED DURING THIS VISIT:  This SmartLink is deprecated. Use AVSMEDLIST instead to display the medication list for a patient.   Note:  This document was prepared using Dragon voice recognition software and may include unintentional dictation errors.    Arta Silence, MD 03/13/17 2035

## 2017-03-13 NOTE — ED Notes (Signed)
Pt appears more lethargic than he did upon arrival to ED.  Concerns expressed to Dr. Cherylann Banas.  Pt arousable, but has to be repeatedly talked to in order for patient to stay awake.  Pt still able to answer questions.

## 2017-03-16 ENCOUNTER — Emergency Department

## 2017-03-16 ENCOUNTER — Emergency Department
Admission: EM | Admit: 2017-03-16 | Discharge: 2017-03-16 | Disposition: A | Discharge status: Home / Self Care | Attending: Emergency Medicine | Admitting: Emergency Medicine

## 2017-03-16 DIAGNOSIS — I129 Hypertensive chronic kidney disease with stage 1 through stage 4 chronic kidney disease, or unspecified chronic kidney disease: Secondary | ICD-10-CM | POA: Insufficient documentation

## 2017-03-16 DIAGNOSIS — Z79899 Other long term (current) drug therapy: Secondary | ICD-10-CM | POA: Diagnosis not present

## 2017-03-16 DIAGNOSIS — R0602 Shortness of breath: Secondary | ICD-10-CM | POA: Diagnosis present

## 2017-03-16 DIAGNOSIS — N183 Chronic kidney disease, stage 3 (moderate): Secondary | ICD-10-CM | POA: Diagnosis not present

## 2017-03-16 DIAGNOSIS — T50905A Adverse effect of unspecified drugs, medicaments and biological substances, initial encounter: Secondary | ICD-10-CM | POA: Diagnosis not present

## 2017-03-16 DIAGNOSIS — Y69 Unspecified misadventure during surgical and medical care: Secondary | ICD-10-CM | POA: Insufficient documentation

## 2017-03-16 DIAGNOSIS — F0390 Unspecified dementia without behavioral disturbance: Secondary | ICD-10-CM | POA: Diagnosis not present

## 2017-03-16 DIAGNOSIS — R400 Somnolence: Secondary | ICD-10-CM | POA: Insufficient documentation

## 2017-03-16 DIAGNOSIS — Z8551 Personal history of malignant neoplasm of bladder: Secondary | ICD-10-CM | POA: Diagnosis not present

## 2017-03-16 LAB — BASIC METABOLIC PANEL
Anion gap: 8 (ref 5–15)
BUN: 28 mg/dL — ABNORMAL HIGH (ref 6–20)
CALCIUM: 9 mg/dL (ref 8.9–10.3)
CO2: 28 mmol/L (ref 22–32)
Chloride: 107 mmol/L (ref 101–111)
Creatinine, Ser: 1.44 mg/dL — ABNORMAL HIGH (ref 0.61–1.24)
GFR calc non Af Amer: 42 mL/min — ABNORMAL LOW (ref >60)
GFR, EST AFRICAN AMERICAN: 48 mL/min — AB (ref >60)
Glucose, Bld: 105 mg/dL — ABNORMAL HIGH (ref 65–99)
Potassium: 4.1 mmol/L (ref 3.5–5.1)
Sodium: 143 mmol/L (ref 135–145)

## 2017-03-16 LAB — CBC WITH DIFFERENTIAL/PLATELET
BASOS PCT: 0 %
Basophils Absolute: 0 10*3/uL (ref 0–0.1)
EOS ABS: 0.1 10*3/uL (ref 0–0.7)
Eosinophils Relative: 1 %
HCT: 36.8 % — ABNORMAL LOW (ref 40.0–52.0)
HEMOGLOBIN: 12 g/dL — AB (ref 13.0–18.0)
Lymphocytes Relative: 7 %
Lymphs Abs: 0.4 10*3/uL — ABNORMAL LOW (ref 1.0–3.6)
MCH: 30.1 pg (ref 26.0–34.0)
MCHC: 32.7 g/dL (ref 32.0–36.0)
MCV: 92 fL (ref 80.0–100.0)
MONO ABS: 0.6 10*3/uL (ref 0.2–1.0)
MONOS PCT: 10 %
NEUTROS PCT: 82 %
Neutro Abs: 4.8 10*3/uL (ref 1.4–6.5)
Platelets: 188 10*3/uL (ref 150–440)
RBC: 3.99 MIL/uL — ABNORMAL LOW (ref 4.40–5.90)
RDW: 16.3 % — AB (ref 11.5–14.5)
WBC: 5.9 10*3/uL (ref 3.8–10.6)

## 2017-03-16 LAB — GLUCOSE, CAPILLARY: GLUCOSE-CAPILLARY: 142 mg/dL — AB (ref 65–99)

## 2017-03-16 NOTE — ED Provider Notes (Signed)
[facility] Emergency Department Provider Note  ____________________________________________  Time seen: Approximately 2:20 PM  I have reviewed the triage vital signs and the nursing notes.   HISTORY  Chief Complaint Shortness of Breath  Level 5 Caveat: Portions of the History and Physical were unable to be obtained due to altered mental status related to chronic dementia.   HPI Colton Tyler is a 84 y.o. male who is here in the ED with his family visiting his wife who was critically ill, when the patient was noted to be somnolent and having occasional jerking movements. He's been in his usual state of health, is in hospice due to recurrent cancer and dementia. Hospice coordinator Santiago Glad notes in the ED that he was started on trazodone and BuSpar last night and also got a dose of trazodone and Percocet this morning. The patient had a fall several days ago and was evaluated in the ED, no recurrent falls since then.     Past Medical History:  Diagnosis Date  . Bladder cancer ([place])   . Chronic kidney disease   . Coronary artery disease   . Dementia   . Diabetes ([place])   . Dysrhythmia   . GERD (gastroesophageal reflux disease)   . Hematuria, gross 07/23/2014  . Hyperlipidemia   . Hypertension   . Pancreatic cancer [facility])    s/p whipple procedure     Patient Active Problem List   Diagnosis Date Noted  . Sepsis ([place]) 01/21/2017  . Benign neoplasm of cecum   . Angiodysplasia of stomach and duodenum with hemorrhage   . Anemia 11/02/2014  . Hematuria, gross 07/23/2014  . Bladder mass 06/09/2014  . Bladder cancer ([place]) 06/09/2014  . Hematochezia 06/09/2014  . Diabetes ([place]) 06/09/2014  . Frank hematuria 06/09/2014  . Blood in the urine 06/09/2014  . BP (high blood pressure) 06/09/2014  . Malignant neoplasm of pancreas ([place]) 06/09/2014  . Surgery follow-up examination 06/09/2014  . Type 2 diabetes mellitus with stage 3 chronic kidney disease, with  long-term current use of insulin ([place]) 06/09/2014  . Bladder disease 06/09/2014  . Essential hypertension 06/09/2014  . Urinary catheter insertion/adjustment/removal 08/22/2013  . Fitting and adjustment of urinary device 08/22/2013  . Abnormal ECG 08/03/2013  . Arteriosclerosis of coronary artery 08/03/2013  . Carotid artery narrowing 08/03/2013  . Hyperlipemia 08/03/2013  . Amnesia 08/03/2013  . OP (osteoporosis) 08/03/2013  . Beat, premature ventricular 08/03/2013  . CAD (coronary artery disease) 08/03/2013  . Carotid artery disease ([place]) 08/03/2013  . Thrombocytopenia ([place]) 07/07/2013     Past Surgical History:  Procedure Laterality Date  . BLADDER SURGERY    . COLONOSCOPY WITH PROPOFOL N/A 07/31/2016   Procedure: COLONOSCOPY WITH PROPOFOL;  Surgeon: [name], MD;  Location: [facility] ENDOSCOPY;  Service: Endoscopy;  Laterality: N/A;  . CYSTOSCOPY W/ RETROGRADES Bilateral 12/07/2016   Procedure: CYSTOSCOPY WITH RETROGRADE PYELOGRAM;  Surgeon: [name], MD;  Location: [facility] ORS;  Service: Urology;  Laterality: Bilateral;  . CYSTOSCOPY WITH BIOPSY N/A 11/05/2015   Procedure: CYSTOSCOPY WITH BIOPSY;  Surgeon: [name], MD;  Location: [facility] ORS;  Service: Urology;  Laterality: N/A;  . CYSTOSCOPY WITH BIOPSY N/A 12/07/2016   Procedure: CYSTOSCOPY WITH BLADDER BIOPSY;  Surgeon: [name], MD;  Location: [facility] ORS;  Service: Urology;  Laterality: N/A;  . CYSTOSCOPY WITH FULGERATION  11/05/2015   Procedure: CYSTOSCOPY WITH FULGERATION;  Surgeon: [name], MD;  Location: [facility] ORS;  Service: Urology;;  . [name] WITH STENT PLACEMENT Right 12/07/2016  Procedure: CYSTOSCOPY WITH STENT PLACEMENT;  Surgeon: [name], MD;  Location: [facility] ORS;  Service: Urology;  Laterality: Right;  . ESOPHAGOGASTRODUODENOSCOPY (EGD) WITH PROPOFOL N/A 07/31/2016   Procedure: ESOPHAGOGASTRODUODENOSCOPY (EGD) WITH PROPOFOL;  Surgeon: [name], MD;  Location: [facility] ENDOSCOPY;  Service:  Endoscopy;  Laterality: N/A;  . HERNIA REPAIR Left    Inguinal Hernia Repair  . PORTACATH PLACEMENT Right 2015   [facility]  . WHIPPLE PROCEDURE    . WHIPPLE PROCEDURE       Prior to Admission medications   Medication Sig Start Date End Date Taking? Authorizing Provider  cefUROXime (CEFTIN) 500 MG tablet Take 1 tablet (500 mg total) 2 (two) times daily with a meal by mouth. X 10 days Patient not taking: Reported on 03/04/2017 01/23/17   Gladstone Lighter, MD  ferrous sulfate 325 (65 FE) MG EC tablet Take 325 mg by mouth daily with breakfast.    [provider]  HYDROcodone-acetaminophen (NORCO/VICODIN) 5-325 MG tablet 1 tab po bid prn 03/05/17   Norval Gable, MD  insulin glargine (LANTUS) 100 UNIT/ML injection Inject 0.15 mLs (15 Units total) into the skin at bedtime. 03/04/17   Earleen Newport, MD  memantine (NAMENDA) 10 MG tablet Take 10 mg by mouth 2 (two) times daily.  02/12/15   [provider]  simvastatin (ZOCOR) 20 MG tablet Take 20 mg by mouth at bedtime.  02/12/15   [provider]     Allergies Penicillins   Family History  Problem Relation Age of Onset  . Cerebrovascular Accident Father   . Stomach cancer Sister   . Kidney disease Neg Hx   . Prostate cancer Neg Hx   . Kidney cancer Neg Hx   . Bladder Cancer Neg Hx     Social History Social History   Tobacco Use  . Smoking status: Never Smoker  . Smokeless tobacco: Never Used  Substance Use Topics  . Alcohol use: No    Alcohol/week: 0.0 oz  . Drug use: No    Review of Systems Unable to reliably obtained due to altered mental status. No noted fever pain complaints or shortness of breath.  ____________________________________________   PHYSICAL EXAM:  VITAL SIGNS: ED Triage Vitals [03/16/17 1344]  Enc Vitals Group     BP 140/68     Pulse      Resp 18     Temp      Temp src      SpO2 99 %     Weight      Height      Head Circumference      Peak Flow      Pain  Score      Pain Loc      Pain Edu?      Excl. in [place]?     Vital signs reviewed, nursing assessments reviewed.   Constitutional:  Somnolent but arousable. Oriented to self. Well appearing and in no distress. Eyes:   No scleral icterus.  EOMI. No nystagmus. No conjunctival pallor. PERRL, miotic. ENT   Head:   Normocephalic with recent ecchymosis and abrasion but no acute injury.   Nose:   No congestion/rhinnorhea. No epistaxis   Mouth/Throat:   MMM, no pharyngeal erythema. No peritonsillar mass.    Neck:   No meningismus. Full ROM. Hematological/Lymphatic/Immunilogical:   No cervical lymphadenopathy. Cardiovascular:   Bradycardia heart rate 45. Symmetric bilateral radial and DP pulses.  No murmurs.  Respiratory:   Normal respiratory effort without tachypnea/retractions. Breath sounds  are clear and equal bilaterally. No wheezes/rales/rhonchi. Gastrointestinal:   Soft and nontender. Non distended. There is no CVA tenderness.  No rebound, rigidity, or guarding. Genitourinary:   deferred Musculoskeletal:   Normal range of motion in all extremities. No joint effusions.  No lower extremity tenderness.  No edema. Neurologic:   Normal speech when awake.  Motor grossly intact. No gross focal neurologic deficits are appreciated.  Skin:    Skin is warm, dry and intact. No rash noted.  No petechiae, purpura, or bullae.  ____________________________________________    LABS (pertinent positives/negatives) (all labs ordered are listed, but only abnormal results are displayed) Labs Reviewed  BASIC METABOLIC PANEL - Abnormal; Notable for the following components:      Result Value   Glucose, Bld 105 (*)    BUN 28 (*)    Creatinine, Ser 1.44 (*)    GFR calc non Af Amer 42 (*)    GFR calc Af Amer 48 (*)    All other components within normal limits  CBC WITH DIFFERENTIAL/PLATELET - Abnormal; Notable for the following components:   RBC 3.99 (*)    Hemoglobin 12.0 (*)    HCT 36.8 (*)     RDW 16.3 (*)    Lymphs Abs 0.4 (*)    All other components within normal limits  GLUCOSE, CAPILLARY - Abnormal; Notable for the following components:   Glucose-Capillary 142 (*)    All other components within normal limits   ____________________________________________   EKG    ____________________________________________    RADIOLOGY  Dg Chest Portable 1 View  Result Date: 03/16/2017 CLINICAL DATA:  Generalize weakness and somnolence. EXAM: PORTABLE CHEST 1 VIEW COMPARISON:  01/21/2017 FINDINGS: Right chest wall port a catheter is noted with tip in the cavoatrial junction. Aortic atherosclerosis. Normal heart size. No pleural effusion or edema. No airspace opacities. IMPRESSION: 1. No acute cardiopulmonary abnormalities. 2.  Aortic Atherosclerosis (ICD10-I70.0). Electronically Signed   By: Kerby Moors M.D.   On: 03/16/2017 14:03    ____________________________________________   PROCEDURES Procedures  ____________________________________________    CLINICAL IMPRESSION / ASSESSMENT AND PLAN / ED COURSE  Pertinent labs & imaging results that were available during my care of the patient were reviewed by me and considered in my medical decision making (see chart for details).   Patient not in distress, likely a chronic baseline, presents with somnolence. He is having frequent jerking movements as he falls asleep, consistent with a parasomnia. This appears to be due to polypharmacy from his newly started BuSpar and trazodone with Percocet as well in an 84 year old man with dementia. Discussed with hospice and family, plan to hold trazodone and BuSpar for now, and they will revisit medications with his primary team. Blood pressure today is essentially unremarkable, sufficiently controlled without antihypertensives given his long-term outlook.   we will check labs and urinalysis as a screening measure plan for discharge back to his residence if there are no severe  findings.  He does appear to be bradycardic here, which I think is also a side effect from the medications. Given that he is currently wheelchair bound and receiving full care, and in hospice care, I don't think this requires hospitalization at this time.    ----------------------------------------- 3:26 PM on 03/16/2017 -----------------------------------------  Unable to obtain urinalysis without doing a in and out catheter. Family does not wish this to be done, it is not essential at this time. I'll discharge home to outpatient follow-up. ____________________________________________   FINAL CLINICAL IMPRESSION(S) / ED  DIAGNOSES    Final diagnoses:  Somnolence  Medication side effect, initial encounter       Portions of this note were generated with dragon dictation software. Dictation errors may occur despite best attempts at proofreading.    Carrie Mew, MD 03/16/17 [phone]

## 2017-03-16 NOTE — Discharge Instructions (Signed)
Hold the trazodone until your doctor can re-evaluate your medication regimen.  The trazodone (especially when combined with oxycodone or hydrocodone) can cause excessive sleepiness.

## 2017-03-16 NOTE — Progress Notes (Signed)
ED Visit made. Patient is currently followed by [facility] at [facility] with a hospice diagnosis of recurrent bladder cancer. He was brought to the [facility] ED by his family to visit his wife, who was also in the ED. Family felt that patient was "not acting like himself" and was "twitching and falling asleep and twitching in his sleep". Patient seen lying on the ED stretcher appeared to be sleeping, difficult to rouse. Family at bedside. ED MD in during visit and assessed patient. CXR and urinalysis ordered. Patient was started on new medication last evening and this morning per report of his hospice RN. His lethargy may be the cause, discussed with ED MD and patient's family present. Hospice RN to follow up with facility MD and address concerns regarding new medications. Family in agreement with plan. If tests are negative plan will be for discharge back to [facility]. Hospice team updated.  Flo Shanks RN, BSN, [place] [facility] [place], [facility] [phone]

## 2017-03-16 NOTE — ED Triage Notes (Signed)
Pt presents today via pov with family. Pt wife expired today about 1240 and about 69 pt family called nurse into room and stated he was rapidly breathing and he was not acting right. Pt is wheelchair bound and has advanced Dementia. EDP aware at bedside. RN will montior

## 2017-03-29 ENCOUNTER — Emergency Department
Admission: EM | Admit: 2017-03-29 | Discharge: 2017-03-31 | Disposition: A | Discharge status: Home / Self Care | Attending: Emergency Medicine | Admitting: Emergency Medicine

## 2017-03-29 ENCOUNTER — Emergency Department

## 2017-03-29 DIAGNOSIS — Z794 Long term (current) use of insulin: Secondary | ICD-10-CM | POA: Diagnosis not present

## 2017-03-29 DIAGNOSIS — W19XXXA Unspecified fall, initial encounter: Secondary | ICD-10-CM | POA: Insufficient documentation

## 2017-03-29 DIAGNOSIS — E1122 Type 2 diabetes mellitus with diabetic chronic kidney disease: Secondary | ICD-10-CM | POA: Insufficient documentation

## 2017-03-29 DIAGNOSIS — M79604 Pain in right leg: Secondary | ICD-10-CM | POA: Diagnosis not present

## 2017-03-29 DIAGNOSIS — Y939 Activity, unspecified: Secondary | ICD-10-CM | POA: Insufficient documentation

## 2017-03-29 DIAGNOSIS — Y9210 Unspecified residential institution as the place of occurrence of the external cause: Secondary | ICD-10-CM | POA: Insufficient documentation

## 2017-03-29 DIAGNOSIS — Z79899 Other long term (current) drug therapy: Secondary | ICD-10-CM | POA: Insufficient documentation

## 2017-03-29 DIAGNOSIS — Z8507 Personal history of malignant neoplasm of pancreas: Secondary | ICD-10-CM | POA: Diagnosis not present

## 2017-03-29 DIAGNOSIS — Z8551 Personal history of malignant neoplasm of bladder: Secondary | ICD-10-CM | POA: Diagnosis not present

## 2017-03-29 DIAGNOSIS — R400 Somnolence: Secondary | ICD-10-CM | POA: Diagnosis not present

## 2017-03-29 DIAGNOSIS — F0390 Unspecified dementia without behavioral disturbance: Secondary | ICD-10-CM | POA: Diagnosis not present

## 2017-03-29 DIAGNOSIS — I2510 Atherosclerotic heart disease of native coronary artery without angina pectoris: Secondary | ICD-10-CM | POA: Diagnosis not present

## 2017-03-29 DIAGNOSIS — Y999 Unspecified external cause status: Secondary | ICD-10-CM | POA: Insufficient documentation

## 2017-03-29 DIAGNOSIS — N189 Chronic kidney disease, unspecified: Secondary | ICD-10-CM | POA: Insufficient documentation

## 2017-03-29 NOTE — Discharge Instructions (Signed)
1.  Speak with your doctor about possibly decreasing dosages or frequency of medicines which are making you very drowsy.  These medicines include buspirone, memantine and Seroquel. 2.  Return to the ER for worsening symptoms, persistent vomiting, difficulty breathing or other concerns.

## 2017-03-29 NOTE — ED Triage Notes (Addendum)
Per EMS: Patient coming from [facility]. Patient had unwitnessed fall - found on floor. Patient fell from chair to hard tile floor. Patient initially complained of right shoulder pain.   Patient currently denies all pain. Patient not tender to palpation of right shoulder.   Patient has hx of dementia. Patient able to answer name/birthdate. Patient disoriented to time/situation.    Patient drowsy.

## 2017-03-29 NOTE — ED Notes (Signed)
This RN called [facility], spoke with primary caregiver all scan results, discharge instructions, and follow-up care. Caregiver verbalized understanding.   Patient's daughter reports she will bring patient back to [facility]. [place] informed.

## 2017-03-29 NOTE — ED Notes (Signed)
Patient reports right leg hurt when applying yellow socks. Patient tender to palpation of right knee.

## 2017-03-29 NOTE — ED Provider Notes (Signed)
[facility] Emergency Department Provider Note   ____________________________________________   First MD Initiated Contact with Patient 03/29/17 [phone]     (approximate)  I have reviewed the triage vital signs and the nursing notes.   HISTORY  Chief Complaint Fall  Level 5 caveat: History limited by dementia 74 of history provided by patient's daughter  HPI Colton Tyler is a 84 y.o. male brought to the ED from [facility] with a chief complaint of unwitnessed fall.  Patient with a history of dementia, bladder cancer on hospice who was found on the floor by staff.  They suspect he fell from his chair to the tile floor.  Daughter states he was recently started on some medications which makes him somnolent.  He routinely leans forward to pick at his lower legs in the chair.  She suspects he lost his balance and fell onto the floor.  Reports he had a fall 3 weeks ago and has a right patella fracture which is why he is in the chair.  EMS reports patient initially complained of right shoulder pain.  Daughter denies recent fever, chills, chest pain, shortness breath, abdominal pain, nausea, vomiting.   Past Medical History:  Diagnosis Date  . Bladder cancer ([place])   . Chronic kidney disease   . Coronary artery disease   . Dementia   . Diabetes ([place])   . Dysrhythmia   . GERD (gastroesophageal reflux disease)   . Hematuria, gross 07/23/2014  . Hyperlipidemia   . Hypertension   . Pancreatic cancer [facility])    s/p whipple procedure    Patient Active Problem List   Diagnosis Date Noted  . Sepsis ([place]) 01/21/2017  . Benign neoplasm of cecum   . Angiodysplasia of stomach and duodenum with hemorrhage   . Anemia 11/02/2014  . Hematuria, gross 07/23/2014  . Bladder mass 06/09/2014  . Bladder cancer ([place]) 06/09/2014  . Hematochezia 06/09/2014  . Diabetes ([place]) 06/09/2014  . Frank hematuria 06/09/2014  . Blood in the urine 06/09/2014  . BP  (high blood pressure) 06/09/2014  . Malignant neoplasm of pancreas ([place]) 06/09/2014  . Surgery follow-up examination 06/09/2014  . Type 2 diabetes mellitus with stage 3 chronic kidney disease, with long-term current use of insulin ([place]) 06/09/2014  . Bladder disease 06/09/2014  . Essential hypertension 06/09/2014  . Urinary catheter insertion/adjustment/removal 08/22/2013  . Fitting and adjustment of urinary device 08/22/2013  . Abnormal ECG 08/03/2013  . Arteriosclerosis of coronary artery 08/03/2013  . Carotid artery narrowing 08/03/2013  . Hyperlipemia 08/03/2013  . Amnesia 08/03/2013  . OP (osteoporosis) 08/03/2013  . Beat, premature ventricular 08/03/2013  . CAD (coronary artery disease) 08/03/2013  . Carotid artery disease ([place]) 08/03/2013  . Thrombocytopenia ([place]) 07/07/2013    Past Surgical History:  Procedure Laterality Date  . BLADDER SURGERY    . COLONOSCOPY WITH PROPOFOL N/A 07/31/2016   Procedure: COLONOSCOPY WITH PROPOFOL;  Surgeon: [name], MD;  Location: [facility] ENDOSCOPY;  Service: Endoscopy;  Laterality: N/A;  . CYSTOSCOPY W/ RETROGRADES Bilateral 12/07/2016   Procedure: CYSTOSCOPY WITH RETROGRADE PYELOGRAM;  Surgeon: [name], MD;  Location: [facility] ORS;  Service: Urology;  Laterality: Bilateral;  . CYSTOSCOPY WITH BIOPSY N/A 11/05/2015   Procedure: CYSTOSCOPY WITH BIOPSY;  Surgeon: [name], MD;  Location: [facility] ORS;  Service: Urology;  Laterality: N/A;  . CYSTOSCOPY WITH BIOPSY N/A 12/07/2016   Procedure: CYSTOSCOPY WITH BLADDER BIOPSY;  Surgeon: [name], MD;  Location: [facility] ORS;  Service: Urology;  Laterality: N/A;  . CYSTOSCOPY WITH FULGERATION  11/05/2015   Procedure: CYSTOSCOPY WITH FULGERATION;  Surgeon: [name], MD;  Location: [facility] ORS;  Service: Urology;;  . [name] WITH STENT PLACEMENT Right 12/07/2016   Procedure: CYSTOSCOPY WITH STENT PLACEMENT;  Surgeon: [name], MD;  Location: [facility] ORS;  Service: Urology;  Laterality:  Right;  . ESOPHAGOGASTRODUODENOSCOPY (EGD) WITH PROPOFOL N/A 07/31/2016   Procedure: ESOPHAGOGASTRODUODENOSCOPY (EGD) WITH PROPOFOL;  Surgeon: [name], MD;  Location: [facility] ENDOSCOPY;  Service: Endoscopy;  Laterality: N/A;  . HERNIA REPAIR Left    Inguinal Hernia Repair  . PORTACATH PLACEMENT Right 2015   [facility]  . WHIPPLE PROCEDURE    . WHIPPLE PROCEDURE      Prior to Admission medications   Medication Sig Start Date End Date Taking? Authorizing Provider  cefUROXime (CEFTIN) 500 MG tablet Take 1 tablet (500 mg total) 2 (two) times daily with a meal by mouth. X 10 days Patient not taking: Reported on 03/04/2017 01/23/17   Gladstone Lighter, MD  ferrous sulfate 325 (65 FE) MG EC tablet Take 325 mg by mouth daily with breakfast.    [provider]  HYDROcodone-acetaminophen (NORCO/VICODIN) 5-325 MG tablet 1 tab po bid prn 03/05/17   Norval Gable, MD  insulin glargine (LANTUS) 100 UNIT/ML injection Inject 0.15 mLs (15 Units total) into the skin at bedtime. 03/04/17   Earleen Newport, MD  memantine (NAMENDA) 10 MG tablet Take 10 mg by mouth 2 (two) times daily.  02/12/15   [provider]  simvastatin (ZOCOR) 20 MG tablet Take 20 mg by mouth at bedtime.  02/12/15   [provider]    Allergies Penicillins  Family History  Problem Relation Age of Onset  . Cerebrovascular Accident Father   . Stomach cancer Sister   . Kidney disease Neg Hx   . Prostate cancer Neg Hx   . Kidney cancer Neg Hx   . Bladder Cancer Neg Hx     Social History Social History   Tobacco Use  . Smoking status: Never Smoker  . Smokeless tobacco: Never Used  Substance Use Topics  . Alcohol use: No    Alcohol/week: 0.0 oz  . Drug use: No    Review of Systems  Constitutional: No fever/chills. Eyes: No visual changes. ENT: No sore throat. Cardiovascular: Denies chest pain. Respiratory: Denies shortness of breath. Gastrointestinal: No abdominal pain.  No nausea, no  vomiting.  No diarrhea.  No constipation. Genitourinary: Negative for dysuria. Musculoskeletal: Positive for right knee pain.  Negative for back pain. Skin: Negative for rash. Neurological: Negative for headaches, focal weakness or numbness.   ____________________________________________   PHYSICAL EXAM:  VITAL SIGNS: ED Triage Vitals  Enc Vitals Group     BP 03/29/17 0459 (!) 148/90     Pulse Rate 03/29/17 0459 78     Resp 03/29/17 0459 16     Temp 03/29/17 0459 97.8 F (36.6 C)     Temp src --      SpO2 03/29/17 0458 100 %     Weight 03/29/17 0459 130 lb (59 kg)     Height --      Head Circumference --      Peak Flow --      Pain Score --      Pain Loc --      Pain Edu? --      Excl. in [place]? --     Constitutional: Alert.  Frail appearing and in no acute distress. Eyes:  Conjunctivae are normal. PERRL. EOMI. Head: Atraumatic.  Old contusion beneath right eye. Nose: No injury or deformity. Mouth/Throat: Edentulous.  No obvious signs of trauma. Neck: No stridor.  No cervical spine tenderness to palpation.  No step-offs or deformities noted. Cardiovascular: Normal rate, regular rhythm. Grossly normal heart sounds.  Good peripheral circulation. Respiratory: Normal respiratory effort.  No retractions. Lungs CTAB. Gastrointestinal: Soft and nontender to light or deep palpation. No distention. No abdominal bruits. No CVA tenderness. Musculoskeletal:  RUE: No deformities or injuries noted.  Full range of motion right shoulder without pain.  2+ radial pulses.  Brisk, less than 5-second capillary refill. No spinal tenderness to palpation.  Pelvis stable. RLE: Mild swelling which daughter states is improved.  Patella tender to palpation.  No calf swelling or tenderness.  2+ distal pulses.  Brisk, less than 5-second capillary refill. Neurologic: Alert and oriented to person.  Drowsy but arousable to verbal stimuli.  Normal speech and language. No gross focal neurologic deficits are  appreciated. MAEx4. Skin:  Skin is warm, dry and intact. No rash noted. Psychiatric: Mood and affect are normal. Speech and behavior are normal.  ____________________________________________   LABS (all labs ordered are listed, but only abnormal results are displayed)  Labs Reviewed - No data to display ____________________________________________  EKG  None ____________________________________________  RADIOLOGY  Dg Pelvis 1-2 Views  Result Date: 03/29/2017 CLINICAL DATA:  Fall EXAM: PELVIS - 1-2 VIEW COMPARISON:  None. FINDINGS: There is no evidence of pelvic fracture or diastasis. No pelvic bone lesions are seen. IMPRESSION: Negative. Electronically Signed   By: Ulyses Jarred M.D.   On: 03/29/2017 05:57   Ct Head Wo Contrast  Result Date: 03/29/2017 CLINICAL DATA:  Unwitnessed fall. History of dementia. Right shoulder pain. EXAM: CT HEAD WITHOUT CONTRAST TECHNIQUE: Contiguous axial images were obtained from the base of the skull through the vertex without intravenous contrast. COMPARISON:  03/13/2017 FINDINGS: Brain: Diffuse cerebral atrophy. Ventricular dilatation consistent with central atrophy. Low-attenuation changes throughout the deep white matter consistent with small vessel ischemia. No mass effect or midline shift. No abnormal extra-axial fluid collections. Gray-white matter junctions are distinct. Basal cisterns are not effaced. No acute intracranial hemorrhage. Vascular: Intracranial arterial vascular calcifications are present. Skull: Calvarium appears intact. Sinuses/Orbits: Old fracture deformities of the right lateral orbital wall, right zygomatic arch, and right lateral maxillary antral wall. Mucosal thickening in the paranasal sinuses. No acute air-fluid levels. Mastoid air cells are not opacified. Other: None. IMPRESSION: No acute intracranial abnormalities. Chronic atrophy and small vessel ischemic changes. Old right facial fracture deformities. No acute fractures  identified. Electronically Signed   By: Lucienne Capers M.D.   On: 03/29/2017 06:05   Dg Knee Complete 4 Views Right  Result Date: 03/29/2017 CLINICAL DATA:  Fall with right knee pain EXAM: RIGHT KNEE - COMPLETE 4+ VIEW COMPARISON:  Right knee radiograph 03/05/2017 FINDINGS: Mildly distracted transverse fracture of the patella is again seen. There is no evidence of interval healing. Moderate prepatellar soft tissue prominence. No knee effusion. No acute fracture or dislocation. Atherosclerotic vascular calcification. IMPRESSION: Unchanged appearance of minimally distracted transverse fracture of the right patella. No acute abnormality. Electronically Signed   By: Ulyses Jarred M.D.   On: 03/29/2017 05:56    ____________________________________________   PROCEDURES  Procedure(s) performed: None  Procedures  Critical Care performed: No  ____________________________________________   INITIAL IMPRESSION / ASSESSMENT AND PLAN / ED COURSE  As part of my medical decision making, I reviewed the following data  within the [place] History obtained from family, Nursing notes reviewed and incorporated, Old chart reviewed, Radiograph reviewed and Notes from prior ED visits.   84 year old male with dementia, bladder cancer on hospice, who presents from dementia unit with unwitnessed fall.  Daughter reports drowsiness is normal and she feels excessive since patient was started on antipsychotics and anxiolytics.  States he is in his baseline mental state.  We discussed extent of evaluation; she is agreeable to CT head, x-ray of pelvis to evaluate for hip injury, and right knee x-ray to evaluate healing of known patellar fracture.  Will hold off on lab work and urine at this time.   Clinical Course as of Mar 29 702  Mon Mar 29, 2017  8828 Patient sleeping in no acute distress.  Updated daughter of imaging results.  She is expressing concerned that he is too somnolent secondary to  his new anti-psychotic and anti-anxiety medications.  I reviewed the [facility] sent by patient's facility.  Looks like he is on Buspirone 5 mg twice daily, Memantine 10 mg twice daily, and Seroquel 25 mg twice daily.  Daughter asking me to make recommendations about reducing his medicines. Strict return precautions given. Daughter verbalizes understanding and agrees with plan of care.  [JS]    Clinical Course User Index [JS] Paulette Blanch, MD     ____________________________________________   FINAL CLINICAL IMPRESSION(S) / ED DIAGNOSES  Final diagnoses:  Fall, initial encounter  Drowsiness  Polypharmacy     ED Discharge Orders    None       Note:  This document was prepared using Dragon voice recognition software and may include unintentional dictation errors.    Paulette Blanch, MD 03/29/17 [id number]

## 2017-03-29 NOTE — ED Notes (Signed)
Fall risk sign placed. Yellow bracelet and socks placed on patient.

## 2017-04-23 ENCOUNTER — Encounter: Payer: Self-pay | Admitting: Anesthesiology

## 2017-04-23 ENCOUNTER — Other Ambulatory Visit: Payer: Self-pay

## 2017-04-23 ENCOUNTER — Inpatient Hospital Stay
Admission: EM | Admit: 2017-04-23 | Discharge: 2017-04-25 | DRG: 377 | Disposition: A | Discharge status: Hospice | Attending: Internal Medicine | Admitting: Internal Medicine

## 2017-04-23 DIAGNOSIS — K219 Gastro-esophageal reflux disease without esophagitis: Secondary | ICD-10-CM | POA: Diagnosis present

## 2017-04-23 DIAGNOSIS — Z8551 Personal history of malignant neoplasm of bladder: Secondary | ICD-10-CM

## 2017-04-23 DIAGNOSIS — Z79899 Other long term (current) drug therapy: Secondary | ICD-10-CM

## 2017-04-23 DIAGNOSIS — E785 Hyperlipidemia, unspecified: Secondary | ICD-10-CM | POA: Diagnosis present

## 2017-04-23 DIAGNOSIS — Z8507 Personal history of malignant neoplasm of pancreas: Secondary | ICD-10-CM | POA: Diagnosis not present

## 2017-04-23 DIAGNOSIS — E43 Unspecified severe protein-calorie malnutrition: Secondary | ICD-10-CM | POA: Diagnosis present

## 2017-04-23 DIAGNOSIS — K922 Gastrointestinal hemorrhage, unspecified: Secondary | ICD-10-CM | POA: Diagnosis present

## 2017-04-23 DIAGNOSIS — Z681 Body mass index (BMI) 19 or less, adult: Secondary | ICD-10-CM | POA: Diagnosis not present

## 2017-04-23 DIAGNOSIS — I129 Hypertensive chronic kidney disease with stage 1 through stage 4 chronic kidney disease, or unspecified chronic kidney disease: Secondary | ICD-10-CM | POA: Diagnosis present

## 2017-04-23 DIAGNOSIS — K921 Melena: Secondary | ICD-10-CM | POA: Diagnosis present

## 2017-04-23 DIAGNOSIS — K625 Hemorrhage of anus and rectum: Secondary | ICD-10-CM | POA: Diagnosis present

## 2017-04-23 DIAGNOSIS — I2510 Atherosclerotic heart disease of native coronary artery without angina pectoris: Secondary | ICD-10-CM | POA: Diagnosis present

## 2017-04-23 DIAGNOSIS — Z66 Do not resuscitate: Secondary | ICD-10-CM | POA: Diagnosis present

## 2017-04-23 DIAGNOSIS — E1122 Type 2 diabetes mellitus with diabetic chronic kidney disease: Secondary | ICD-10-CM | POA: Diagnosis present

## 2017-04-23 DIAGNOSIS — Z515 Encounter for palliative care: Secondary | ICD-10-CM | POA: Diagnosis not present

## 2017-04-23 DIAGNOSIS — D62 Acute posthemorrhagic anemia: Secondary | ICD-10-CM | POA: Diagnosis present

## 2017-04-23 DIAGNOSIS — F0390 Unspecified dementia without behavioral disturbance: Secondary | ICD-10-CM | POA: Diagnosis present

## 2017-04-23 DIAGNOSIS — N183 Chronic kidney disease, stage 3 (moderate): Secondary | ICD-10-CM | POA: Diagnosis present

## 2017-04-23 DIAGNOSIS — D649 Anemia, unspecified: Secondary | ICD-10-CM

## 2017-04-23 DIAGNOSIS — Z90411 Acquired partial absence of pancreas: Secondary | ICD-10-CM

## 2017-04-23 LAB — CBC WITH DIFFERENTIAL/PLATELET
Basophils Absolute: 0 10*3/uL (ref 0–0.1)
Basophils Relative: 0 %
Eosinophils Absolute: 0.1 10*3/uL (ref 0–0.7)
Eosinophils Relative: 0 %
HEMATOCRIT: 30 % — AB (ref 40.0–52.0)
HEMOGLOBIN: 9.7 g/dL — AB (ref 13.0–18.0)
LYMPHS ABS: 0.6 10*3/uL — AB (ref 1.0–3.6)
LYMPHS PCT: 4 %
MCH: 29.6 pg (ref 26.0–34.0)
MCHC: 32.2 g/dL (ref 32.0–36.0)
MCV: 92 fL (ref 80.0–100.0)
MONOS PCT: 5 %
Monocytes Absolute: 0.6 10*3/uL (ref 0.2–1.0)
NEUTROS PCT: 91 %
Neutro Abs: 12.2 10*3/uL — ABNORMAL HIGH (ref 1.4–6.5)
Platelets: 209 10*3/uL (ref 150–440)
RBC: 3.26 MIL/uL — AB (ref 4.40–5.90)
RDW: 16.5 % — ABNORMAL HIGH (ref 11.5–14.5)
WBC: 13.5 10*3/uL — AB (ref 3.8–10.6)

## 2017-04-23 LAB — TYPE AND SCREEN
ABO/RH(D): O POS
ANTIBODY SCREEN: NEGATIVE

## 2017-04-23 LAB — CBC
HCT: 25.2 % — ABNORMAL LOW (ref 40.0–52.0)
HEMOGLOBIN: 8.1 g/dL — AB (ref 13.0–18.0)
MCH: 29.2 pg (ref 26.0–34.0)
MCHC: 32.2 g/dL (ref 32.0–36.0)
MCV: 90.5 fL (ref 80.0–100.0)
Platelets: 181 10*3/uL (ref 150–440)
RBC: 2.79 MIL/uL — ABNORMAL LOW (ref 4.40–5.90)
RDW: 16.6 % — ABNORMAL HIGH (ref 11.5–14.5)
WBC: 7.2 10*3/uL (ref 3.8–10.6)

## 2017-04-23 LAB — BASIC METABOLIC PANEL
Anion gap: 10 (ref 5–15)
BUN: 22 mg/dL — AB (ref 6–20)
CO2: 22 mmol/L (ref 22–32)
Calcium: 7.9 mg/dL — ABNORMAL LOW (ref 8.9–10.3)
Chloride: 109 mmol/L (ref 101–111)
Creatinine, Ser: 1.44 mg/dL — ABNORMAL HIGH (ref 0.61–1.24)
GFR calc Af Amer: 48 mL/min — ABNORMAL LOW (ref >60)
GFR calc non Af Amer: 42 mL/min — ABNORMAL LOW (ref >60)
GLUCOSE: 257 mg/dL — AB (ref 65–99)
POTASSIUM: 3.5 mmol/L (ref 3.5–5.1)
Sodium: 141 mmol/L (ref 135–145)

## 2017-04-23 LAB — GLUCOSE, CAPILLARY: Glucose-Capillary: 314 mg/dL — ABNORMAL HIGH (ref 65–99)

## 2017-04-23 LAB — MRSA PCR SCREENING: MRSA BY PCR: NEGATIVE

## 2017-04-23 MED ORDER — BOOST / RESOURCE BREEZE PO LIQD CUSTOM
1.0000 | Freq: Three times a day (TID) | ORAL | Status: DC
  Administered 2017-04-23 – 2017-04-25 (×4): 1 via ORAL

## 2017-04-23 MED ORDER — SODIUM CHLORIDE 0.9 % IV SOLN: INTRAVENOUS | Status: DC

## 2017-04-23 MED ORDER — ACETAMINOPHEN 650 MG RE SUPP
650.0000 mg | Freq: Four times a day (QID) | RECTAL | Status: DC | PRN
Start: 2017-04-23 — End: 2017-04-25

## 2017-04-23 MED ORDER — DEXTROSE-NACL 5-0.9 % IV SOLN
INTRAVENOUS | Status: DC
  Administered 2017-04-23: 12:00:00 via INTRAVENOUS

## 2017-04-23 MED ORDER — ONDANSETRON HCL 4 MG/2ML IJ SOLN: 4.0000 mg | Freq: Four times a day (QID) | INTRAMUSCULAR | Status: DC | PRN

## 2017-04-23 MED ORDER — ACETAMINOPHEN 325 MG PO TABS: 650.0000 mg | ORAL_TABLET | Freq: Four times a day (QID) | ORAL | Status: DC | PRN

## 2017-04-23 MED ORDER — SODIUM CHLORIDE 0.9 % IV SOLN
80.0000 mg | Freq: Once | INTRAVENOUS | Status: AC
  Administered 2017-04-23: 06:00:00 80 mg via INTRAVENOUS
  Filled 2017-04-23: qty 80

## 2017-04-23 MED ORDER — PANTOPRAZOLE SODIUM 40 MG IV SOLR: 40.0000 mg | Freq: Two times a day (BID) | INTRAVENOUS | Status: DC

## 2017-04-23 MED ORDER — SODIUM CHLORIDE 0.9 % IV SOLN
8.0000 mg/h | INTRAVENOUS | Status: DC
  Administered 2017-04-23 – 2017-04-24 (×3): 8 mg/h via INTRAVENOUS
  Filled 2017-04-23 (×3): qty 80

## 2017-04-23 MED ORDER — SODIUM CHLORIDE 0.9 % IV SOLN: 8.0000 mg/h | INTRAVENOUS | Status: DC

## 2017-04-23 MED ORDER — ONDANSETRON HCL 4 MG PO TABS: 4.0000 mg | ORAL_TABLET | Freq: Four times a day (QID) | ORAL | Status: DC | PRN

## 2017-04-23 NOTE — Consult Note (Signed)
Vonda Antigua, MD [address] [place], [place], [place], [address] [address], [place], [place], [place], [address] Phone: [phone]  Fax: [phone]  Consultation  Referring Provider:     Dr. Anselm Jungling Primary Care Physician:  Patient, No Pcp Per Primary Gastroenterologist:  Virgel Manifold, MD        Reason for Consultation:     Bright blood per rectum  Date of Admission:  04/23/2017 Date of Consultation:  04/23/2017         HPI:   Colton Tyler is a 84 y.o. male with history of pancreatic cancer, status post Whipple procedure in the 1960s, history of bladder cancer, sent from assisted living facility due to bright red blood per rectum.  Daughter at bedside who is providing history.  The episode has occurred only once this morning.  No prior episodes, no episodes since then.  Daughter states, she was told it was mild blood streaks in the stool, and it was not a large amount of blood or clots.  No abdominal pain, no nausea vomiting, no hematemesis.  No NSAID use.  Patient is not on anticoagulants.  Daughter reports that a week ago patient had a 24-hour episode of nausea vomiting and diarrhea which resolved within 24 hours.  She states other residents at the assisted living facility had the same episode at that time.  Patient was found to have a hemoglobin of 9.7 on presentation was 12, 1 month ago.  Daughter also states, patient has home hospice who sees him 3 times a week, and she is waiting to see them while he is here today.  He was seen by Dr. Allen Norris in May 2018, for iron deficiency anemia, and underwent an EGD which showed recently bleeding angiectasias and APC was performed.  He also underwent colonoscopy at the same time, and a 9 mm cecal polyp was removed via hot snare, and pathology showed tubular adenoma.  As per the letter sent to the patient after pathology review, no further colonoscopies were recommended.  He follows Dr. Mike Gip, and her last note states  "He has a history of pancreatic cancer in the 1960s/1970s.  He underwent Whipple.  He has had no evidence of recurrent disease."  As far as bladder cancer is concerned, he is seen by [facility] urology.  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  He has had recurrent lesions since then.  And as of recently underwent TURBT on October 2018 with [name].  Pathology consistent with recurrence of high-grade T1 disease.  He is on BCG treatment course, with surveillance cystoscopy scheduled in March 2019.  As per their notes, he was noted to have suspicious liver lesions on CT in May 2018, but it was benign on MRI of the liver.   Past Medical History:  Diagnosis Date  . Bladder cancer ([place])   . Chronic kidney disease   . Coronary artery disease   . Dementia   . Diabetes ([place])   . Dysrhythmia   . GERD (gastroesophageal reflux disease)   . Hematuria, gross 07/23/2014  . Hyperlipidemia   . Hypertension   . Pancreatic cancer [facility])    s/p whipple procedure    Past Surgical History:  Procedure Laterality Date  . BLADDER SURGERY    . COLONOSCOPY WITH PROPOFOL N/A 07/31/2016   Procedure: COLONOSCOPY WITH PROPOFOL;  Surgeon: [name], MD;  Location: [facility] ENDOSCOPY;  Service: Endoscopy;  Laterality: N/A;  . CYSTOSCOPY W/ RETROGRADES Bilateral 12/07/2016   Procedure: CYSTOSCOPY WITH RETROGRADE PYELOGRAM;  Surgeon: [name], MD;  Location: [facility] ORS;  Service: Urology;  Laterality: Bilateral;  . CYSTOSCOPY WITH BIOPSY N/A 11/05/2015   Procedure: CYSTOSCOPY WITH BIOPSY;  Surgeon: [name], MD;  Location: [facility] ORS;  Service: Urology;  Laterality: N/A;  . CYSTOSCOPY WITH BIOPSY N/A 12/07/2016   Procedure: CYSTOSCOPY WITH BLADDER BIOPSY;  Surgeon: [name], MD;  Location: [facility] ORS;  Service: Urology;  Laterality: N/A;  . CYSTOSCOPY WITH FULGERATION  11/05/2015   Procedure:  CYSTOSCOPY WITH FULGERATION;  Surgeon: [name], MD;  Location: [facility] ORS;  Service: Urology;;  . [name] WITH STENT PLACEMENT Right 12/07/2016   Procedure: CYSTOSCOPY WITH STENT PLACEMENT;  Surgeon: [name], MD;  Location: [facility] ORS;  Service: Urology;  Laterality: Right;  . ESOPHAGOGASTRODUODENOSCOPY (EGD) WITH PROPOFOL N/A 07/31/2016   Procedure: ESOPHAGOGASTRODUODENOSCOPY (EGD) WITH PROPOFOL;  Surgeon: [name], MD;  Location: [facility] ENDOSCOPY;  Service: Endoscopy;  Laterality: N/A;  . HERNIA REPAIR Left    Inguinal Hernia Repair  . PORTACATH PLACEMENT Right 2015   [facility]  . WHIPPLE PROCEDURE    . WHIPPLE PROCEDURE      Prior to Admission medications   Medication Sig Start Date End Date Taking? Authorizing Provider  acetaminophen (TYLENOL) 325 MG tablet Take 325 mg by mouth 2 (two) times daily.   Yes [provider]  busPIRone (BUSPAR) 5 MG tablet Take 5 mg by mouth 3 (three) times daily.   Yes [provider]  ferrous sulfate 325 (65 FE) MG EC tablet Take 325 mg by mouth daily with breakfast.   Yes [provider]  insulin lispro (HUMALOG) 100 UNIT/ML injection Inject 4-16 Units into the skin 3 (three) times daily before meals. Per sliding scale 150-200= 4 units 201-250= 6 units 251-300= 8 units 301-350= 12 units 351-400= 14 units 401 or greater =16 units and call MD   Yes [provider]  QUEtiapine (SEROQUEL) 25 MG tablet Take 12.5 mg by mouth 2 (two) times daily.   Yes [provider]  sennosides-docusate sodium (SENOKOT-S) 8.6-50 MG tablet Take 1 tablet by mouth daily.   Yes [provider]  traZODone (DESYREL) 50 MG tablet Take 50 mg by mouth at bedtime.   Yes [provider]  cefUROXime (CEFTIN) 500 MG tablet Take 1 tablet (500 mg total) 2 (two) times daily with a meal by mouth. X 10 days Patient not taking: Reported on 03/04/2017 01/23/17   Gladstone Lighter, MD  HYDROcodone-acetaminophen  (NORCO/VICODIN) 5-325 MG tablet 1 tab po bid prn Patient not taking: Reported on 04/23/2017 03/05/17   Norval Gable, MD  insulin glargine (LANTUS) 100 UNIT/ML injection Inject 0.15 mLs (15 Units total) into the skin at bedtime. Patient not taking: Reported on 04/23/2017 03/04/17   Earleen Newport, MD  memantine (NAMENDA) 10 MG tablet Take 10 mg by mouth 2 (two) times daily.  02/12/15   [provider]  simvastatin (ZOCOR) 20 MG tablet Take 20 mg by mouth at bedtime.  02/12/15   [provider]    Family History  Problem Relation Age of Onset  . Cerebrovascular Accident Father   . Stomach cancer Sister   . Kidney disease Neg Hx   . Prostate cancer Neg Hx   . Kidney cancer Neg Hx   . Bladder Cancer Neg Hx      Social History   Tobacco Use  . Smoking status:  Never Smoker  . Smokeless tobacco: Never Used  Substance Use Topics  . Alcohol use: No    Alcohol/week: 0.0 oz  . Drug use: No    Allergies as of 04/23/2017 - Review Complete 04/23/2017  Allergen Reaction Noted  . Penicillins Other (See Comments) 06/09/2014    Review of Systems:    All systems reviewed and negative except where noted in HPI.   Physical Exam:  Vital signs in last 24 hours: Vitals:   04/23/17 0630 04/23/17 0900 04/23/17 0930 04/23/17 1101  BP: 106/76 112/60 (!) 103/55 (!) 98/52  Pulse: (!) 101   98  Resp: 19 20 20    Temp:    98.6 F (37 C)  TempSrc:    Oral  SpO2: 97%   93%  Weight:      Height:         General:   Pleasant, cooperative in NAD Head:  Normocephalic and atraumatic. Eyes:   No icterus.   Conjunctiva pink. PERRLA. Ears:  Normal auditory acuity. Neck:  Supple; no masses or thyroidomegaly Lungs: Respirations even and unlabored. Lungs clear to auscultation bilaterally.   No wheezes, crackles, or rhonchi.  Heart:  Regular rate and rhythm;  Without murmur, clicks, rubs or gallops Abdomen:  Soft, nondistended, nontender. Normal bowel sounds. No appreciable masses or  hepatomegaly.  No rebound or guarding.  Skin:  Intact without significant lesions or rashes. Cervical Nodes:  No significant cervical adenopathy. Psych:  Alert and cooperative. Normal affect.  LAB RESULTS: Recent Labs    04/23/17 0513  WBC 13.5*  HGB 9.7*  HCT 30.0*  PLT 209   BMET Recent Labs    04/23/17 0513  NA 141  K 3.5  CL 109  CO2 22  GLUCOSE 257*  BUN 22*  CREATININE 1.44*  CALCIUM 7.9*   LFT No results for input(s): PROT, ALBUMIN, AST, ALT, ALKPHOS, BILITOT, BILIDIR, IBILI in the last 72 hours. PT/INR No results for input(s): LABPROT, INR in the last 72 hours.  STUDIES: No results found.    Impression / Plan:   Colton Tyler is a 84 y.o. y/o male with bright red blood per rectum at assisted living facility, history of pancreatic cancer with Whipple procedure in the 1960s with no reported recurrence, history of bladder cancer undergoing current therapy who receives home hospice at his assisted living facility  Patient has previously known AVMs that required APC in May 2018 His current anemia could be AVM induced, versus peptic ulcer disease, versus hemorrhoids,  No reported hypotension, no clinical signs of ischemic colitis Colonic malignancy is less likely, given the recent colonoscopy Gastroenteritis induced anemia is certainly a possibility, given patient's recent nausea vomiting diarrhea with   Since patient is a hospice patient at home, patient would like to speak to the hospice staff in regard to decision-making.  Would recommend palliative care consult for the same Patient does not have any further bleeding, daughter would like to hold off on any endoscopies due to the risks associated with the procedure, as to goal with his hospice status is comfort according to the daughter. If he has active GI bleeding, she is willing to proceed with an endoscopy.  No repeated bowel movements since initial bowel movement at 4 PM today.  Would recommend ruling  out C. difficile, and obtain GI panel to rule out any underlying infection Continue PPI IV twice daily Continue serial CBCs and transfuse PRN Avoid NSAIDs Avoid hypotension Clear liquid diet okay today, then n.p.o.  past midnight Decision for and timing for EGD depending on clinical status and family's decision making   Thank you for involving me in the care of this patient.      LOS: 0 days   Virgel Manifold, MD  04/23/2017, 1:52 PM

## 2017-04-23 NOTE — Care Management (Addendum)
Patient from [facility] and presents with gi bleeding.  Patient is currently receiving hospice services at the facility for bladder cancer.  Patient is DNR. There is no need for palliative care consult

## 2017-04-23 NOTE — Progress Notes (Signed)
[facility] NAME: Colton Tyler    MR#:  [id number]  DATE OF BIRTH:  1925-12-01  SUBJECTIVE:  CHIEF COMPLAINT:   Chief Complaint  Patient presents with  . Rectal Bleeding     Came with rectal bleed from assisted living place, Hb dropped some, BP stable. Pt is not in distress.  REVIEW OF SYSTEMS:  Pt have dementia, not able to give ROS. ROS  DRUG ALLERGIES:   Allergies  Allergen Reactions  . Penicillins Other (See Comments)    Has patient had a PCN reaction causing immediate rash, facial/tongue/throat swelling, SOB or lightheadedness with hypotension: No Has patient had a PCN reaction causing severe rash involving mucus membranes or skin necrosis: No Has patient had a PCN reaction that required hospitalization: No Has patient had a PCN reaction occurring within the last 10 years: No If all of the above answers are "NO", then may proceed with Cephalosporin use.    VITALS:  Blood pressure (!) 98/52, pulse 98, temperature 98.6 F (37 C), temperature source Oral, resp. rate 20, height 6' (1.829 m), weight 59 kg (130 lb), SpO2 93 %.  PHYSICAL EXAMINATION:  GENERAL:  84 y.o.-year-old patient lying in the bed with no acute distress.  EYES: Pupils equal, round, reactive to light and accommodation. No scleral icterus. Extraocular muscles intact.  HEENT: Head atraumatic, normocephalic. Oropharynx and nasopharynx clear.  NECK:  Supple, no jugular venous distention. No thyroid enlargement, no tenderness.  LUNGS: Normal breath sounds bilaterally, no wheezing, rales,rhonchi or crepitation. No use of accessory muscles of respiration.  CARDIOVASCULAR: S1, S2 normal. No murmurs, rubs, or gallops.  ABDOMEN: Soft, nontender, nondistended. Bowel sounds present. No organomegaly or mass.  EXTREMITIES: No pedal edema, cyanosis, or clubbing.  NEUROLOGIC: Cranial nerves II through XII are intact. Muscle strength 4-5/5 in all extremities. Sensation  intact. Gait not checked.  PSYCHIATRIC: The patient is alert and oriented x 1.  SKIN: No obvious rash, lesion, or ulcer.   Physical Exam LABORATORY PANEL:   CBC Recent Labs  Lab 04/23/17 0513  WBC 13.5*  HGB 9.7*  HCT 30.0*  PLT 209   ------------------------------------------------------------------------------------------------------------------  Chemistries  Recent Labs  Lab 04/23/17 0513  NA 141  K 3.5  CL 109  CO2 22  GLUCOSE 257*  BUN 22*  CREATININE 1.44*  CALCIUM 7.9*   ------------------------------------------------------------------------------------------------------------------  Cardiac Enzymes No results for input(s): TROPONINI in the last 168 hours. ------------------------------------------------------------------------------------------------------------------  RADIOLOGY:  No results found.  ASSESSMENT AND PLAN:   Active Problems:   GI bleed  * Acute GI bleed   Anemia due to blood loss    This may be Lower Gi- Diverticular bleed.   Protonix IV. Hb monitor.   GI consult, NPO for now- If no procedures, may give liquid diet and upgrade.  * Diabetes   Hold Insuline due to NPO and GI bleed.   Keep on Finger stick.  * Hyperlipidemia   Will resume simvastatin once able to take oral food.  * Hx of CAD   Had Gi bleed in past and since then off ASA per daughter.  * CKD stage 3    Stable, monitor.     All the records are reviewed and case discussed with Care Management/Social Workerr. Management plans discussed with the patient, family and they are in agreement.  CODE STATUS: DNR  TOTAL TIME TAKING CARE OF THIS PATIENT: 35 minutes.   Discussed with daughter in room.  POSSIBLE D/C IN  1-2 DAYS, DEPENDING ON CLINICAL CONDITION.   Vaughan Basta M.D on 04/23/2017   Between 7am to 6pm - Pager - [phone]  After 6pm go to [email or web address] - password [email or web address] [facility]  Office  [phone]  CC: Primary  care physician; Patient, No Pcp Per  Note: This dictation was prepared with Dragon dictation along with smaller phrase technology. Any transcriptional errors that result from this process are unintentional.

## 2017-04-23 NOTE — ED Notes (Signed)
Pt brief changed. Updated family on delay of going upstairs

## 2017-04-23 NOTE — H&P (Signed)
[facility] NAME: Colton Tyler    MR#:  [id number]  DATE OF BIRTH:  1925-07-23  DATE OF ADMISSION:  04/23/2017  PRIMARY CARE PHYSICIAN: Kirk Ruths, MD   REQUESTING/REFERRING PHYSICIAN:   CHIEF COMPLAINT:   Chief Complaint  Patient presents with  . Rectal Bleeding    HISTORY OF PRESENT ILLNESS: Colton Tyler  is a 84 y.o. male with a known history of bladder cancer, chronic kidney disease, coronary artery disease, dementia, diabetes mellitus, GERD, pancreatic cancer presented to the emergency room with bright red blood per rectum.  It was noticed in the may been [facility] assisted living facility.  Patient has been accompanied by his daughter in the emergency room.  According to the information from the assisted living facility no vomiting of blood or coughing of blood.  Patient had endoscopy done last year according to the family.  No complaints of any abdominal pain.  No nausea and vomiting.  Patient was started on IV Protonix drip in the emergency room.  PAST MEDICAL HISTORY:   Past Medical History:  Diagnosis Date  . Bladder cancer ([place])   . Chronic kidney disease   . Coronary artery disease   . Dementia   . Diabetes ([place])   . Dysrhythmia   . GERD (gastroesophageal reflux disease)   . Hematuria, gross 07/23/2014  . Hyperlipidemia   . Hypertension   . Pancreatic cancer [facility])    s/p whipple procedure    PAST SURGICAL HISTORY:  Past Surgical History:  Procedure Laterality Date  . BLADDER SURGERY    . COLONOSCOPY WITH PROPOFOL N/A 07/31/2016   Procedure: COLONOSCOPY WITH PROPOFOL;  Surgeon: [name], MD;  Location: [facility] ENDOSCOPY;  Service: Endoscopy;  Laterality: N/A;  . CYSTOSCOPY W/ RETROGRADES Bilateral 12/07/2016   Procedure: CYSTOSCOPY WITH RETROGRADE PYELOGRAM;  Surgeon: [name], MD;  Location: [facility] ORS;  Service: Urology;  Laterality: Bilateral;  . CYSTOSCOPY WITH BIOPSY N/A 11/05/2015   Procedure: CYSTOSCOPY WITH BIOPSY;  Surgeon: [name], MD;  Location: [facility] ORS;  Service: Urology;  Laterality: N/A;  . CYSTOSCOPY WITH BIOPSY N/A 12/07/2016   Procedure: CYSTOSCOPY WITH BLADDER BIOPSY;  Surgeon: [name], MD;  Location: [facility] ORS;  Service: Urology;  Laterality: N/A;  . CYSTOSCOPY WITH FULGERATION  11/05/2015   Procedure: CYSTOSCOPY WITH FULGERATION;  Surgeon: [name], MD;  Location: [facility] ORS;  Service: Urology;;  . [name] WITH STENT PLACEMENT Right 12/07/2016   Procedure: CYSTOSCOPY WITH STENT PLACEMENT;  Surgeon: [name], MD;  Location: [facility] ORS;  Service: Urology;  Laterality: Right;  . ESOPHAGOGASTRODUODENOSCOPY (EGD) WITH PROPOFOL N/A 07/31/2016   Procedure: ESOPHAGOGASTRODUODENOSCOPY (EGD) WITH PROPOFOL;  Surgeon: [name], MD;  Location: [facility] ENDOSCOPY;  Service: Endoscopy;  Laterality: N/A;  . HERNIA REPAIR Left    Inguinal Hernia Repair  . PORTACATH PLACEMENT Right 2015   [facility]  . WHIPPLE PROCEDURE    . WHIPPLE PROCEDURE      SOCIAL HISTORY:  Social History   Tobacco Use  . Smoking status: Never Smoker  . Smokeless tobacco: Never Used  Substance Use Topics  . Alcohol use: No    Alcohol/week: 0.0 oz    FAMILY HISTORY:  Family History  Problem Relation Age of Onset  . Cerebrovascular Accident Father   . Stomach cancer Sister   . Kidney disease Neg Hx   . Prostate cancer Neg Hx   . Kidney cancer Neg Hx   . Bladder Cancer Neg Hx  DRUG ALLERGIES:  Allergies  Allergen Reactions  . Penicillins Other (See Comments)    Has patient had a PCN reaction causing immediate rash, facial/tongue/throat swelling, SOB or lightheadedness with hypotension: No Has patient had a PCN reaction causing severe rash involving mucus membranes or skin necrosis: No Has patient had a PCN reaction that required hospitalization: No Has patient had a PCN reaction occurring within the last 10 years: No If all of the above answers are "NO", then  may proceed with Cephalosporin use.    REVIEW OF SYSTEMS:   CONSTITUTIONAL: No fever, has weakness.  EYES: No blurred or double vision.  EARS, NOSE, AND THROAT: No tinnitus or ear pain.  RESPIRATORY: No cough, shortness of breath, wheezing or hemoptysis.  CARDIOVASCULAR: No chest pain, orthopnea, edema.  GASTROINTESTINAL: No nausea, vomiting, diarrhea or abdominal pain.  Has rectal bleed GENITOURINARY: No dysuria, hematuria.  ENDOCRINE: No polyuria, nocturia,  HEMATOLOGY: No anemia, easy bruising or bleeding SKIN: No rash or lesion. MUSCULOSKELETAL: No joint pain or arthritis.   NEUROLOGIC: No tingling, numbness, weakness.  PSYCHIATRY: No anxiety or depression.   MEDICATIONS AT HOME:  Prior to Admission medications   Medication Sig Start Date End Date Taking? Authorizing Provider  cefUROXime (CEFTIN) 500 MG tablet Take 1 tablet (500 mg total) 2 (two) times daily with a meal by mouth. X 10 days Patient not taking: Reported on 03/04/2017 01/23/17   Gladstone Lighter, MD  ferrous sulfate 325 (65 FE) MG EC tablet Take 325 mg by mouth daily with breakfast.    [provider]  HYDROcodone-acetaminophen (NORCO/VICODIN) 5-325 MG tablet 1 tab po bid prn 03/05/17   Norval Gable, MD  insulin glargine (LANTUS) 100 UNIT/ML injection Inject 0.15 mLs (15 Units total) into the skin at bedtime. 03/04/17   Earleen Newport, MD  memantine (NAMENDA) 10 MG tablet Take 10 mg by mouth 2 (two) times daily.  02/12/15   [provider]  simvastatin (ZOCOR) 20 MG tablet Take 20 mg by mouth at bedtime.  02/12/15   [provider]      PHYSICAL EXAMINATION:   VITAL SIGNS: Blood pressure (!) 142/97, pulse 100, temperature 98 F (36.7 C), temperature source Oral, resp. rate 20, height 6' (1.829 m), weight 59 kg (130 lb), SpO2 100 %.  GENERAL:  84 y.o.-year-old patient lying in the bed with no acute distress.  EYES: Pupils equal, round, reactive to light and accommodation. No  scleral icterus. Extraocular muscles intact.  HEENT: Head atraumatic, normocephalic. Oropharynx dry and nasopharynx clear.  NECK:  Supple, no jugular venous distention. No thyroid enlargement, no tenderness.  LUNGS: Normal breath sounds bilaterally, no wheezing, rales,rhonchi or crepitation. No use of accessory muscles of respiration.  CARDIOVASCULAR: S1, S2 normal. No murmurs, rubs, or gallops.  ABDOMEN: Soft, nontender, nondistended. Bowel sounds present. No organomegaly or mass.  EXTREMITIES: No pedal edema, cyanosis, or clubbing.  NEUROLOGIC: Cranial nerves II through XII are intact. Muscle strength 5/5 in all extremities. Sensation intact. Gait not checked.  PSYCHIATRIC: The patient is alert and oriented x 3.  SKIN: No obvious rash, lesion, or ulcer.   LABORATORY PANEL:   CBC Recent Labs  Lab 04/23/17 0513  WBC 13.5*  HGB 9.7*  HCT 30.0*  PLT 209  MCV 92.0  MCH 29.6  MCHC 32.2  RDW 16.5*  LYMPHSABS 0.6*  MONOABS 0.6  EOSABS 0.1  BASOSABS 0.0   ------------------------------------------------------------------------------------------------------------------  Chemistries  Recent Labs  Lab 04/23/17 0513  NA 141  K 3.5  CL 109  CO2 22  GLUCOSE 257*  BUN 22*  CREATININE 1.44*  CALCIUM 7.9*   ------------------------------------------------------------------------------------------------------------------ estimated creatinine clearance is 29 mL/min (A) (by C-G formula based on SCr of 1.44 mg/dL (H)). ------------------------------------------------------------------------------------------------------------------ No results for input(s): TSH, T4TOTAL, T3FREE, THYROIDAB in the last 72 hours.  Invalid input(s): FREET3   Coagulation profile No results for input(s): INR, PROTIME in the last 168 hours. ------------------------------------------------------------------------------------------------------------------- No results for input(s): DDIMER in the last 72  hours. -------------------------------------------------------------------------------------------------------------------  Cardiac Enzymes No results for input(s): CKMB, TROPONINI, MYOGLOBIN in the last 168 hours.  Invalid input(s): CK ------------------------------------------------------------------------------------------------------------------ Invalid input(s): POCBNP  ---------------------------------------------------------------------------------------------------------------  Urinalysis    Component Value Date/Time   COLORURINE YELLOW (A) 03/13/2017 1717   APPEARANCEUR CLEAR (A) 03/13/2017 1717   APPEARANCEUR Clear 02/09/2017 1528   LABSPEC 1.011 03/13/2017 1717   PHURINE 5.0 03/13/2017 1717   GLUCOSEU 50 (A) 03/13/2017 1717   HGBUR NEGATIVE 03/13/2017 1717   BILIRUBINUR NEGATIVE 03/13/2017 1717   BILIRUBINUR Negative 02/09/2017 1528   KETONESUR NEGATIVE 03/13/2017 1717   PROTEINUR NEGATIVE 03/13/2017 1717   NITRITE NEGATIVE 03/13/2017 1717   LEUKOCYTESUR NEGATIVE 03/13/2017 1717   LEUKOCYTESUR Negative 02/09/2017 1528     RADIOLOGY: No results found.  EKG: Orders placed or performed during the hospital encounter of 03/13/17  . EKG 12-Lead  . EKG 12-Lead  . EKG 12-Lead  . EKG 12-Lead  . ED EKG  . ED EKG  . EKG 12-Lead  . EKG 12-Lead  . EKG    IMPRESSION AND PLAN: 84 year old male patient with history of bladder cancer, pancreatic cancer, chronic kidney disease, diabetes mellitus, GERD presented to the emergency room with rectal bleed.  Admitting diagnosis 1.  Acute gastrointestinal bleeding 2.  Anemia 3.  Pancreatic cancer 4.  Bladder cancer 5.  Chronic kidney disease  treatment plan Admit patient to medical floor N.p.o. Gastroenterology consultation IV Protonix drip Serial hemoglobin hematocrit monitoring   All the records are reviewed and case discussed with ED provider. Management plans discussed with the patient, family and they are in  agreement.  CODE STATUS:DNR Code Status History    Date Active Date Inactive Code Status Order ID Comments User Context   01/21/2017 21:26 01/23/2017 17:44 DNR [id number]  Fritzi Mandes, MD Inpatient   07/30/2016 17:41 08/01/2016 16:54 DNR [id number]  Hillary Bow, MD Inpatient   07/30/2016 17:35 07/30/2016 17:40 DNR [id number]  Idalia Needle, RN Inpatient    Questions for Most Recent Historical Code Status (Order [id number])    Question Answer Comment   In the event of cardiac or respiratory ARREST Do not call a "code blue"    In the event of cardiac or respiratory ARREST Do not perform Intubation, CPR, defibrillation or ACLS    In the event of cardiac or respiratory ARREST Use medication by any route, position, wound care, and other measures to relive pain and suffering. May use oxygen, suction and manual treatment of airway obstruction as needed for comfort.         Advance Directive Documentation     Most Recent Value  Type of Advance Directive  Out of facility DNR (pink MOST or yellow form)  Pre-existing out of facility DNR order (yellow form or pink MOST form)  Yellow form placed in chart (order not valid for inpatient use)  "MOST" Form in Place?  No data       TOTAL TIME TAKING CARE OF THIS PATIENT: 54 minutes.    Saundra Shelling M.D on 04/23/2017 at  6:33 AM  Between 7am to 6pm - Pager - [phone]  After 6pm go to [email or web address] - password [email or web address] [facility]  [place] [facility]  Office  [phone]  CC: Primary care physician; Kirk Ruths, MD

## 2017-04-23 NOTE — Plan of Care (Signed)
  Health Behavior/Discharge Planning: Ability to manage health-related needs will improve 04/23/2017 1453 - Progressing by Daron Offer, RN Note Patient agreeable to EGD tomorrow if approved by GI physician. Patient already consented. Wenda Low [facility]

## 2017-04-23 NOTE — ED Provider Notes (Signed)
[facility] Emergency Department Provider Note   ____________________________________________   I have reviewed the triage vital signs and the nursing notes.   HISTORY  Chief Complaint Rectal Bleeding   History limited by and level 5 caveat due to dementia.  HPI Colton Tyler is a 84 y.o. male who presents to the emergency department today from living facility because of rectal bleeding. The patient unfortunately cannot give any significant history. Apparently staff noticed red blood in stool tonight. EMS stated that it smelled of bloody stool. The patient denies any abdominal pain, chest pain or shortness of breath.   Per medical record review patient has a history of dementia.   Past Medical History:  Diagnosis Date  . Bladder cancer ([place])   . Chronic kidney disease   . Coronary artery disease   . Dementia   . Diabetes ([place])   . Dysrhythmia   . GERD (gastroesophageal reflux disease)   . Hematuria, gross 07/23/2014  . Hyperlipidemia   . Hypertension   . Pancreatic cancer [facility])    s/p whipple procedure    Patient Active Problem List   Diagnosis Date Noted  . Sepsis ([place]) 01/21/2017  . Benign neoplasm of cecum   . Angiodysplasia of stomach and duodenum with hemorrhage   . Anemia 11/02/2014  . Hematuria, gross 07/23/2014  . Bladder mass 06/09/2014  . Bladder cancer ([place]) 06/09/2014  . Hematochezia 06/09/2014  . Diabetes ([place]) 06/09/2014  . Frank hematuria 06/09/2014  . Blood in the urine 06/09/2014  . BP (high blood pressure) 06/09/2014  . Malignant neoplasm of pancreas ([place]) 06/09/2014  . Surgery follow-up examination 06/09/2014  . Type 2 diabetes mellitus with stage 3 chronic kidney disease, with long-term current use of insulin ([place]) 06/09/2014  . Bladder disease 06/09/2014  . Essential hypertension 06/09/2014  . Urinary catheter insertion/adjustment/removal 08/22/2013  . Fitting and adjustment of urinary device 08/22/2013  .  Abnormal ECG 08/03/2013  . Arteriosclerosis of coronary artery 08/03/2013  . Carotid artery narrowing 08/03/2013  . Hyperlipemia 08/03/2013  . Amnesia 08/03/2013  . OP (osteoporosis) 08/03/2013  . Beat, premature ventricular 08/03/2013  . CAD (coronary artery disease) 08/03/2013  . Carotid artery disease ([place]) 08/03/2013  . Thrombocytopenia ([place]) 07/07/2013    Past Surgical History:  Procedure Laterality Date  . BLADDER SURGERY    . COLONOSCOPY WITH PROPOFOL N/A 07/31/2016   Procedure: COLONOSCOPY WITH PROPOFOL;  Surgeon: [name], MD;  Location: [facility] ENDOSCOPY;  Service: Endoscopy;  Laterality: N/A;  . CYSTOSCOPY W/ RETROGRADES Bilateral 12/07/2016   Procedure: CYSTOSCOPY WITH RETROGRADE PYELOGRAM;  Surgeon: [name], MD;  Location: [facility] ORS;  Service: Urology;  Laterality: Bilateral;  . CYSTOSCOPY WITH BIOPSY N/A 11/05/2015   Procedure: CYSTOSCOPY WITH BIOPSY;  Surgeon: [name], MD;  Location: [facility] ORS;  Service: Urology;  Laterality: N/A;  . CYSTOSCOPY WITH BIOPSY N/A 12/07/2016   Procedure: CYSTOSCOPY WITH BLADDER BIOPSY;  Surgeon: [name], MD;  Location: [facility] ORS;  Service: Urology;  Laterality: N/A;  . CYSTOSCOPY WITH FULGERATION  11/05/2015   Procedure: CYSTOSCOPY WITH FULGERATION;  Surgeon: [name], MD;  Location: [facility] ORS;  Service: Urology;;  . [name] WITH STENT PLACEMENT Right 12/07/2016   Procedure: CYSTOSCOPY WITH STENT PLACEMENT;  Surgeon: [name], MD;  Location: [facility] ORS;  Service: Urology;  Laterality: Right;  . ESOPHAGOGASTRODUODENOSCOPY (EGD) WITH PROPOFOL N/A 07/31/2016   Procedure: ESOPHAGOGASTRODUODENOSCOPY (EGD) WITH PROPOFOL;  Surgeon: [name], MD;  Location: [facility] ENDOSCOPY;  Service: Endoscopy;  Laterality: N/A;  .  HERNIA REPAIR Left    Inguinal Hernia Repair  . PORTACATH PLACEMENT Right 2015   [facility]  . WHIPPLE PROCEDURE    . WHIPPLE PROCEDURE      Prior to Admission medications   Medication Sig Start Date End  Date Taking? Authorizing Provider  cefUROXime (CEFTIN) 500 MG tablet Take 1 tablet (500 mg total) 2 (two) times daily with a meal by mouth. X 10 days Patient not taking: Reported on 03/04/2017 01/23/17   Gladstone Lighter, MD  ferrous sulfate 325 (65 FE) MG EC tablet Take 325 mg by mouth daily with breakfast.    [provider]  HYDROcodone-acetaminophen (NORCO/VICODIN) 5-325 MG tablet 1 tab po bid prn 03/05/17   Norval Gable, MD  insulin glargine (LANTUS) 100 UNIT/ML injection Inject 0.15 mLs (15 Units total) into the skin at bedtime. 03/04/17   Earleen Newport, MD  memantine (NAMENDA) 10 MG tablet Take 10 mg by mouth 2 (two) times daily.  02/12/15   [provider]  simvastatin (ZOCOR) 20 MG tablet Take 20 mg by mouth at bedtime.  02/12/15   [provider]    Allergies Penicillins  Family History  Problem Relation Age of Onset  . Cerebrovascular Accident Father   . Stomach cancer Sister   . Kidney disease Neg Hx   . Prostate cancer Neg Hx   . Kidney cancer Neg Hx   . Bladder Cancer Neg Hx     Social History Social History   Tobacco Use  . Smoking status: Never Smoker  . Smokeless tobacco: Never Used  Substance Use Topics  . Alcohol use: No    Alcohol/week: 0.0 oz  . Drug use: No    Review of Systems Unable to obtain secondary to dementia.   ____________________________________________   PHYSICAL EXAM:  VITAL SIGNS: ED Triage Vitals  Enc Vitals Group     BP 04/23/17 0514 (!) 146/68     Pulse Rate 04/23/17 0514 92     Resp 04/23/17 0514 20     Temp 04/23/17 0514 98 F (36.7 C)     Temp Source 04/23/17 0514 Oral     SpO2 04/23/17 0508 92 %     Weight 04/23/17 0516 130 lb (59 kg)     Height 04/23/17 0516 6' (1.829 m)    Constitutional: Awake and alert. No acute distress.  Eyes: Conjunctivae are normal.  ENT   Head: Normocephalic and atraumatic.   Nose: No congestion/rhinnorhea.   Mouth/Throat: Mucous membranes are  moist.   Neck: No stridor. Hematological/Lymphatic/Immunilogical: No cervical lymphadenopathy. Cardiovascular: Normal rate, regular rhythm.  No murmurs, rubs, or gallops.  Respiratory: Normal respiratory effort without tachypnea nor retractions. Breath sounds are clear and equal bilaterally. No wheezes/rales/rhonchi. Gastrointestinal: Soft and non tender. No rebound. No guarding.  Rectal: Red blood on glove.  Musculoskeletal: Normal range of motion in all extremities. No lower extremity edema. Neurologic:  Demented. Moves all extremities. Sensation grossly intact. Skin:  Skin is warm, dry and intact. No rash noted. Psychiatric: Mood and affect are normal. Speech and behavior are normal. Patient exhibits appropriate insight and judgment.  ____________________________________________    LABS (pertinent positives/negatives)  CBC wbc 13.5, hgb 9.7, plt 209 BMP na 141, glu 257, cr 1.44  ____________________________________________   EKG  None  ____________________________________________    RADIOLOGY  None  ____________________________________________   PROCEDURES  Procedures  ____________________________________________   INITIAL IMPRESSION / ASSESSMENT AND PLAN / ED COURSE  Pertinent labs & imaging results that were available  during my care of the patient were reviewed by me and considered in my medical decision making (see chart for details).  Patient presented to the emergency department today because of concerns for rectal bleeding.  Rectal exam here does show red blood on the glove however no active extravasation noted.  Patient's blood work does show an anemia of 9.7.  This is down from previous lab result.  Patient was started on IV Protonix.  Will plan on admission to the hospital service.  ____________________________________________   FINAL CLINICAL IMPRESSION(S) / ED DIAGNOSES  Final diagnoses:  Rectal bleeding  Anemia, unspecified type      Note: This dictation was prepared with Dragon dictation. Any transcriptional errors that result from this process are unintentional     Nance Pear, MD 04/23/17 [phone]

## 2017-04-23 NOTE — Progress Notes (Signed)
Initial Nutrition Assessment  DOCUMENTATION CODES:   Severe malnutrition in context of chronic illness  INTERVENTION:   Boost Breeze po TID, each supplement provides 250 kcal and 9 grams of protein  Pt will need dysphagia 3 diet when diet advanced.   NUTRITION DIAGNOSIS:   Severe Malnutrition related to cancer and cancer related treatments, other (see comment)(dementia, advanced age ) as evidenced by 11 percent weight loss in 2 months, severe fat depletion, severe muscle depletion.  GOAL:   Patient will meet greater than or equal to 90% of their needs  MONITOR:   PO intake, Supplement acceptance, Weight trends, TF tolerance, Skin, I & O's, Labs  REASON FOR ASSESSMENT:   Malnutrition Screening Tool    ASSESSMENT:   84 y.o. male with a known history of bladder cancer, chronic kidney disease, coronary artery disease, dementia, diabetes mellitus, GERD, pancreatic cancer presented to the emergency room with bright red blood per rectum.    Visited pt's room today. Unable to speak with pt r/t pt with dementia. History obtained from family member at bedside. Family member is unsure if pt has been eating as he resides at a memory care unit but she reports that pt has been losing weight. Pt's UBW is around 130lbs. Per chart, pt has lost 14lbs(11%) over the past 2 months; this is severe. Family member reports that pt does have dentures and sometimes has trouble chewing; pt will need dysphagia 3 diet when his diet is advanced. Pt initiated on clear liquids today. RD will add Boost Breeze to help pt meet his estimated needs. RD will change to Ensure when diet is advanced.   Medications reviewed and include: protonix, NaCl w/ 5% dextrose  Labs reviewed: BUN 22(H), creat 1.44(H), Ca 7.9(L) Hgb 8.1(L), Hct 25.2(L) Glucose- 257(H) AIC 7.2(H)- 07/2016  Nutrition-Focused physical exam completed. Findings are severe fat depletions in arms, severe muscle depletions in legs and hands, and no  edema. RD did not perform thorough exam on pt as pt was sleeping at time of RD visit and is followed by hospice.   Diet Order:  Diet NPO time specified Diet clear liquid Room service appropriate? No; Fluid consistency: Thin  EDUCATION NEEDS:   Education needs have been addressed(with family members )  Skin:  Reviewed RN Assessment  Last BM:  PTA  Height:   Ht Readings from Last 1 Encounters:  04/23/17 6' (1.829 m)    Weight:   Wt Readings from Last 1 Encounters:  04/23/17 115 lb 1.3 oz (52.2 kg)    Ideal Body Weight:  80.9 kg  BMI:  Body mass index is 15.61 kg/m.  Estimated Nutritional Needs:   Kcal:  1700-2000kcal/day   Protein:  89-100g/day   Fluid:  >1.7L/day   Koleen Distance MS, RD, LDN Pager #[phone] After Hours Pager: [phone]

## 2017-04-23 NOTE — Progress Notes (Signed)
Visit made. Patient is currently followed by [facility] at [facility] with a hospice diagnosis of recurrent bladder cancer. He is a DNR code with out of facility DNR in place. He was sent to the [facility] ED by the med tech at [facility] due to rectal bleeding.  Patient seen lying in bed, daughter Levander Campion at bedside. Patient has had no further bright red bowel movements. Per Diane and chart review,  he has a slight drop in his hemoglobin form 9.7 to 8.1. He remains on a Protonix drip, NPO until midnight. GI consult completed. No further invasive procedures planned at this time. Diane ware to call hospice with any concerns. Hospice team updated. Flo Shanks RN, BSN, [facility] [place], hospital Liaison [phone]

## 2017-04-23 NOTE — ED Triage Notes (Addendum)
Pt arrives to ED via [facility] from [facility] (Dementia/Memory Care Unit) with c/o rectal bleeding. Per EMS, facility noticed blood in his stool x1 hr prior to arrival to ED. Pt is Hospice pt with yellow DNR from facility. EMS reports initial BP of 100/50, improved to while 130/60 en route.

## 2017-04-24 ENCOUNTER — Encounter
Admission: EM | Disposition: A | Discharge status: Hospice | Payer: Self-pay | Source: Home / Self Care | Attending: Internal Medicine

## 2017-04-24 ENCOUNTER — Other Ambulatory Visit: Payer: Self-pay

## 2017-04-24 DIAGNOSIS — K625 Hemorrhage of anus and rectum: Secondary | ICD-10-CM

## 2017-04-24 DIAGNOSIS — E43 Unspecified severe protein-calorie malnutrition: Secondary | ICD-10-CM

## 2017-04-24 LAB — GLUCOSE, CAPILLARY
GLUCOSE-CAPILLARY: 243 mg/dL — AB (ref 65–99)
Glucose-Capillary: 263 mg/dL — ABNORMAL HIGH (ref 65–99)
Glucose-Capillary: 261 mg/dL — ABNORMAL HIGH (ref 65–99)
Glucose-Capillary: 397 mg/dL — ABNORMAL HIGH (ref 65–99)

## 2017-04-24 LAB — CBC
HCT: 20.3 % — ABNORMAL LOW (ref 40.0–52.0)
Hemoglobin: 6.5 g/dL — ABNORMAL LOW (ref 13.0–18.0)
MCH: 29.1 pg (ref 26.0–34.0)
MCHC: 31.9 g/dL — AB (ref 32.0–36.0)
MCV: 91.3 fL (ref 80.0–100.0)
PLATELETS: 169 10*3/uL (ref 150–440)
RBC: 2.22 MIL/uL — AB (ref 4.40–5.90)
RDW: 16.3 % — ABNORMAL HIGH (ref 11.5–14.5)
WBC: 5.5 10*3/uL (ref 3.8–10.6)

## 2017-04-24 LAB — BASIC METABOLIC PANEL
ANION GAP: 8 (ref 5–15)
BUN: 28 mg/dL — AB (ref 6–20)
CALCIUM: 7.2 mg/dL — AB (ref 8.9–10.3)
CO2: 21 mmol/L — ABNORMAL LOW (ref 22–32)
Chloride: 111 mmol/L (ref 101–111)
Creatinine, Ser: 1.32 mg/dL — ABNORMAL HIGH (ref 0.61–1.24)
GFR calc Af Amer: 53 mL/min — ABNORMAL LOW (ref >60)
GFR, EST NON AFRICAN AMERICAN: 46 mL/min — AB (ref >60)
GLUCOSE: 257 mg/dL — AB (ref 65–99)
Potassium: 2.9 mmol/L — ABNORMAL LOW (ref 3.5–5.1)
SODIUM: 140 mmol/L (ref 135–145)

## 2017-04-24 SURGERY — EGD (ESOPHAGOGASTRODUODENOSCOPY)
Anesthesia: General

## 2017-04-24 MED ORDER — SODIUM CHLORIDE 0.9 % IV SOLN
INTRAVENOUS | Status: DC
  Administered 2017-04-24: via INTRAVENOUS

## 2017-04-24 MED ORDER — POTASSIUM CHLORIDE IN NACL 40-0.9 MEQ/L-% IV SOLN
INTRAVENOUS | Status: AC
  Administered 2017-04-24: 75 mL/h via INTRAVENOUS
  Filled 2017-04-24: qty 1000

## 2017-04-24 MED ORDER — MORPHINE SULFATE (PF) 2 MG/ML IV SOLN: 1.0000 mg | INTRAVENOUS | Status: DC | PRN

## 2017-04-24 NOTE — Plan of Care (Signed)
  Education: Knowledge of General Education information will improve 04/24/2017 1431 - Not Progressing by Daron Offer, RN Note Patient is now on comfort care measures only. Anticipate transfer to a non-cardiac monitored floor either later today or tomorrow. Will continue to monitor until order discontinued. Wenda Low [facility]

## 2017-04-24 NOTE — Progress Notes (Signed)
[place] at [place] NAME: Colton Tyler    MR#:  [id number]  DATE OF BIRTH:  05-Dec-1925  SUBJECTIVE:  CHIEF COMPLAINT:   Chief Complaint  Patient presents with  . Rectal Bleeding     Came with rectal bleed from assisted living place, Hb dropped again, BP stable. Pt is not in distress.  REVIEW OF SYSTEMS:  Pt have dementia, not able to give ROS. ROS  DRUG ALLERGIES:   Allergies  Allergen Reactions  . Penicillins Other (See Comments)    Has patient had a PCN reaction causing immediate rash, facial/tongue/throat swelling, SOB or lightheadedness with hypotension: No Has patient had a PCN reaction causing severe rash involving mucus membranes or skin necrosis: No Has patient had a PCN reaction that required hospitalization: No Has patient had a PCN reaction occurring within the last 10 years: No If all of the above answers are "NO", then may proceed with Cephalosporin use.    VITALS:  Blood pressure 100/80, pulse 99, temperature 98 F (36.7 C), temperature source Oral, resp. rate 19, height 6' (1.829 m), weight 52.2 kg (115 lb 1.3 oz), SpO2 99 %.  PHYSICAL EXAMINATION:  GENERAL:  84 y.o.-year-old patient lying in the bed with no acute distress.  EYES: Pupils equal, round, reactive to light and accommodation. No scleral icterus. Extraocular muscles intact.  HEENT: Head atraumatic, normocephalic. Oropharynx and nasopharynx clear.  NECK:  Supple, no jugular venous distention. No thyroid enlargement, no tenderness.  LUNGS: Normal breath sounds bilaterally, no wheezing, rales,rhonchi or crepitation. No use of accessory muscles of respiration.  CARDIOVASCULAR: S1, S2 normal. No murmurs, rubs, or gallops.  ABDOMEN: Soft, nontender, nondistended. Bowel sounds present. No organomegaly or mass.  EXTREMITIES: No pedal edema, cyanosis, or clubbing.  NEUROLOGIC: Cranial nerves II through XII are intact. Muscle strength 3-4/5 in all extremities.  Sensation intact. Gait not checked.  PSYCHIATRIC: The patient is alert and oriented x 1.  SKIN: No obvious rash, lesion, or ulcer.   Physical Exam LABORATORY PANEL:   CBC Recent Labs  Lab 04/24/17 0637  WBC 5.5  HGB 6.5*  HCT 20.3*  PLT 169   ------------------------------------------------------------------------------------------------------------------  Chemistries  Recent Labs  Lab 04/24/17 0637  NA 140  K 2.9*  CL 111  CO2 21*  GLUCOSE 257*  BUN 28*  CREATININE 1.32*  CALCIUM 7.2*   ------------------------------------------------------------------------------------------------------------------  Cardiac Enzymes No results for input(s): TROPONINI in the last 168 hours. ------------------------------------------------------------------------------------------------------------------  RADIOLOGY:  No results found.  ASSESSMENT AND PLAN:   Active Problems:   GI bleed   Protein-calorie malnutrition, severe  * Acute GI bleed   Anemia due to blood loss    This may be Lower Gi- Diverticular bleed. He also had peptic ulcer disease in past.   Protonix IV. Hb monitor.   GI consult done, but patient's daughter decided to have no aggressive procedures and keep him comfort care and work on hospice arrangements, so we started him on liquid diet and will not follow any labs or his vitals anymore.  * Diabetes   Hold Insuline due to NPO and GI bleed.   D/c Finger stick as comfort care.  * Hyperlipidemia   Will resume simvastatin once able to take oral food.  * Hx of CAD   Had Gi bleed in past and since then off ASA per daughter.  * CKD stage 3    Stable, monitor.  Patient had continued GI bleed, his hemoglobin dropped from 12-  1 month ago to 9.7 yesterday and 6.5 today. Discussed with his daughter about the options of aggressive treatment including blood transfusion and procedures versus comfort measures and hospice. Patient was already seen by hospice team at  his assisted living place. Daughter agreed for comfort measures only, as he has active bleeding going on and he may have a life expectancy a few days and is appropriate for hospice home. Spoke to case manager for further arrangements.     All the records are reviewed and case discussed with Care Management/Social Workerr. Management plans discussed with the patient, family and they are in agreement.  CODE STATUS: DNR  TOTAL TIME TAKING CARE OF THIS PATIENT: 45 minutes.   Discussed with daughter in room. Discussed with GI physician and case management.  POSSIBLE D/C IN 1-2 DAYS, DEPENDING ON CLINICAL CONDITION.   Vaughan Basta M.D on 04/24/2017   Between 7am to 6pm - Pager - [phone]  After 6pm go to [email or web address] - password [email or web address] [place] [facility]  Office  [phone]  CC: Primary care physician; Patient, No Pcp Per  Note: This dictation was prepared with Dragon dictation along with smaller phrase technology. Any transcriptional errors that result from this process are unintentional.

## 2017-04-24 NOTE — Progress Notes (Signed)
   Jonathon Bellows , MD [address], [place], [place], [place], [address] [address] [place], [place], [place], [address] Phone: [phone]  Fax: [phone]   FRENCH KENDRA is being followed for rectal bleeding  Day 1 of follow up   Subjective: Has had further rectal bleeding , no other complaints    Objective: Vital signs in last 24 hours: Vitals:   04/23/17 1402 04/23/17 1927 04/24/17 0430 04/24/17 0842  BP:  123/61 (!) 123/55 100/80  Pulse:  98 94 99  Resp:  18 17 19   Temp:  98.6 F (37 C) 98 F (36.7 C)   TempSrc:  Oral Oral   SpO2:  100% 100% 99%  Weight: 115 lb 1.3 oz (52.2 kg)     Height:       Weight change: -14.7 oz (-6.768 kg) No intake or output data in the 24 hours ending 04/24/17 1158   Exam: Heart:: Regular rate and rhythm, S1S2 present or without murmur or extra heart sounds Lungs: normal, clear to auscultation and clear to auscultation and percussion Abdomen: soft, nontender, normal bowel sounds   Lab Results: @LABTEST2 @ Micro Results: Recent Results (from the past 240 hour(s))  MRSA PCR Screening     Status: None   Collection Time: 04/23/17 12:47 PM  Result Value Ref Range Status   MRSA by PCR NEGATIVE NEGATIVE Final    Comment:        The GeneXpert MRSA Assay (FDA approved for NASAL specimens only), is one component of a comprehensive MRSA colonization surveillance program. It is not intended to diagnose MRSA infection nor to guide or monitor treatment for MRSA infections. Performed at [facility], [address] [place], [place] [address]    Studies/Results: No results found. Medications: I have reviewed the patient's current medications. Scheduled Meds: . feeding supplement  1 Container Oral TID BM  . [START ON 04/26/2017] pantoprazole  40 mg Intravenous Q12H   Continuous Infusions: . 0.9 % NaCl with KCl 40 mEq / L 75 mL/hr (04/24/17 0949)  . pantoprozole (PROTONIX) infusion 8 mg/hr (04/24/17 0950)   PRN  Meds:.acetaminophen **OR** acetaminophen, ondansetron **OR** ondansetron (ZOFRAN) IV   Assessment: Active Problems:   GI bleed   Protein-calorie malnutrition, severe   Antaeus A Rasool 84 y.o. male with history of bladder cancer . Admitted with rectal bleeding . Long discussion with the daughter regarding goals of care, they are as a family leaning towards comfort care and at this time do not want any procedures. Discussed this issue with Dr Marthann Schiller in the morning . Please call me if the plan changes and any intervention needed per GI.     LOS: 1 day   Jonathon Bellows, MD 04/24/2017, 11:58 AM

## 2017-04-24 NOTE — Progress Notes (Signed)
Pt found with a large stool containing blood clots. MD Duane Boston made aware. Pt asymptomatic. Will continue to monitor.

## 2017-04-24 NOTE — Clinical Social Work Note (Addendum)
Currently, no beds are available at the [facility]. The CSW has contacted the Hospice Home to place the patient on the waitlist. As this patient is already followed by [facility], his position on the list will be higher than other referrals. CSW is following and has sent the referral to the facility.  Santiago Bumpers, MSW, Latanya Presser [phone]

## 2017-04-24 NOTE — Care Management Note (Signed)
Case Management Note  Patient Details  Name: KEVONTA PHARISS MRN: [id number] Date of Birth: 1925-10-12  Subjective/Objective:   Per weekend CSW there are no Hospice of [facility] facility beds available today but Mr Onorato is now on the waiting list. Dr Anselm Jungling reports that he will write a discharge order as soon as a Hospice facility bed is available.                  Action/Plan:   Expected Discharge Date:                  Expected Discharge Plan:     In-House Referral:     Discharge planning Services     Post Acute Care Choice:    Choice offered to:     DME Arranged:    DME Agency:     HH Arranged:    HH Agency:     Status of Service:     If discussed at [employer] of Stay Meetings, dates discussed:    Additional Comments:  Casten Floren A, RN 04/24/2017, 2:31 PM

## 2017-04-24 NOTE — Progress Notes (Signed)
CBG at 00:00 reported to have increased to 314. MD Burlene Arnt made aware. Pt has no coverage but is on 5% dextrose IV infusion. MD discontinued dextrose IV and changed it to regular NS at the same rate. Will continue to monitor a.m. BS

## 2017-04-24 NOTE — Progress Notes (Signed)
Family Meeting Note  Advance Directive:yes  Today a meeting took place with the daughter.  Patient is unable to participate due FX:OVANVB capacity dementia.   The following clinical team members were present during this meeting:MD  The following were discussed:Patient's diagnosis: Gi bleed. Acute blood loss anemia , Patient's progosis: < 2 weeks and Goals for treatment: DNR  Patient had continued GI bleed, his hemoglobin dropped from 12-  1 month ago to 9.7 yesterday and 6.5 today. Discussed with his daughter about the options of aggressive treatment including blood transfusion and procedures versus comfort measures and hospice. Patient was already seen by hospice team at his assisted living place. Daughter agreed for comfort measures only, as he has active bleeding going on and he may have a life expectancy a few days and is appropriate for hospice home. Spoke to case manager for further arrangements.  Additional follow-up to be provided: hospice.  Time spent during discussion:20 minutes  Vaughan Basta, MD

## 2017-04-25 MED ORDER — MORPHINE SULFATE (CONCENTRATE) 10 MG/0.5ML PO SOLN: 10.0000 mg | ORAL | 0 refills | Status: AC | PRN

## 2017-04-25 MED ORDER — ONDANSETRON HCL 4 MG PO TABS: 4.0000 mg | ORAL_TABLET | Freq: Four times a day (QID) | ORAL | 0 refills | Status: AC | PRN

## 2017-04-25 NOTE — Clinical Social Work Note (Signed)
The patient will discharge to the [facility] via non-emergent EMS. The facility has received discharge documentation. CSW will deliver the discharge packet once the facility reports on time that they can receive the patient. CSW will sign off once the packet is delivered. Please consult should additional needs arise.  Santiago Bumpers, MSW, Latanya Presser [phone]

## 2017-04-25 NOTE — Progress Notes (Signed)
Colton Tyler to be D/C' to [facility] per MD order. Report called to Jackelyn Poling, Therapist, sports. Per Jackelyn Poling may leave IV in. 22g on right forearm. Discussed prescriptions and follow up appointments with the patient and daughter. Prescriptions sent with patient, medication list explained in detail. Pt verbalized understanding.  Allergies as of 04/25/2017      Reactions   Penicillins Other (See Comments)   Has patient had a PCN reaction causing immediate rash, facial/tongue/throat swelling, SOB or lightheadedness with hypotension: No Has patient had a PCN reaction causing severe rash involving mucus membranes or skin necrosis: No Has patient had a PCN reaction that required hospitalization: No Has patient had a PCN reaction occurring within the last 10 years: No If all of the above answers are "NO", then may proceed with Cephalosporin use.      Medication List    STOP taking these medications   acetaminophen 325 MG tablet Commonly known as:  TYLENOL   busPIRone 5 MG tablet Commonly known as:  BUSPAR   cefUROXime 500 MG tablet Commonly known as:  CEFTIN   ferrous sulfate 325 (65 FE) MG EC tablet   HYDROcodone-acetaminophen 5-325 MG tablet Commonly known as:  NORCO/VICODIN   insulin glargine 100 UNIT/ML injection Commonly known as:  LANTUS   insulin lispro 100 UNIT/ML injection Commonly known as:  HUMALOG   NAMENDA 10 MG tablet Generic drug:  memantine   QUEtiapine 25 MG tablet Commonly known as:  SEROQUEL   sennosides-docusate sodium 8.6-50 MG tablet Commonly known as:  SENOKOT-S   simvastatin 20 MG tablet Commonly known as:  ZOCOR   traZODone 50 MG tablet Commonly known as:  DESYREL     TAKE these medications   morphine CONCENTRATE 10 MG/0.5ML Soln concentrated solution Take 0.5 mLs (10 mg total) by mouth every 2 (two) hours as needed for severe pain, anxiety or shortness of breath.   ondansetron 4 MG tablet Commonly known as:  ZOFRAN Take 1 tablet (4 mg total)  by mouth every 6 (six) hours as needed for nausea.       Vitals:   04/25/17 0424 04/25/17 1143  BP: (!) 99/49 110/63  Pulse: (!) 111 (!) 103  Resp: 17 18  Temp: 98.5 F (36.9 C)   SpO2: 94% 100%    Skin clean, dry and intact without evidence of skin break down, no evidence of skin tears noted.Pt denies pain at this time. No complaints noted.  An After Visit Summary was printed and given to the patient. Patient escorted via stretcher by [facility].  Rolley Sims

## 2017-04-25 NOTE — Discharge Instructions (Signed)
Comfort care  °

## 2017-04-25 NOTE — Discharge Summary (Signed)
Colton Tyler NAME: Colton Tyler    MR#:  [id number]  DATE OF BIRTH:  1925/05/01  DATE OF ADMISSION:  04/23/2017 ADMITTING PHYSICIAN: Saundra Shelling, MD  DATE OF DISCHARGE: 04/25/2017  PRIMARY CARE PHYSICIAN: Patient, No Pcp Per    ADMISSION DIAGNOSIS:  Rectal bleeding [K62.5] Anemia, unspecified type [D64.9]  DISCHARGE DIAGNOSIS:  Active Problems:   GI bleed   Protein-calorie malnutrition, severe   SECONDARY DIAGNOSIS:   Past Medical History:  Diagnosis Date  . Bladder cancer ([place])   . Chronic kidney disease   . Coronary artery disease   . Dementia   . Diabetes ([place])   . Dysrhythmia   . GERD (gastroesophageal reflux disease)   . Hematuria, gross 07/23/2014  . Hyperlipidemia   . Hypertension   . Pancreatic cancer [facility])    s/p whipple procedure    HOSPITAL COURSE:   * Acute GI bleed   Anemia due to blood loss    This may be Lower Gi- Diverticular bleed. He also had peptic ulcer disease in past.   Protonix IV. Hb monitor.   GI consult done, but patient's daughter decided to have no aggressive procedures and keep him comfort care and work on hospice arrangements, so we started him on liquid diet and will not follow any labs or his vitals anymore.  Transfer to hospice home for comfort care.  * Diabetes   Hold Insuline due to NPO and GI bleed.   D/c Finger stick as comfort care.  * Hyperlipidemia     * Hx of CAD   Had Gi bleed in past and since then off ASA per daughter.  * CKD stage 3    Stable, monitor.    DISCHARGE CONDITIONS:   Fair.  CONSULTS OBTAINED:    DRUG ALLERGIES:   Allergies  Allergen Reactions  . Penicillins Other (See Comments)    Has patient had a PCN reaction causing immediate rash, facial/tongue/throat swelling, SOB or lightheadedness with hypotension: No Has patient had a PCN reaction causing severe rash involving mucus membranes or skin necrosis: No Has patient had  a PCN reaction that required hospitalization: No Has patient had a PCN reaction occurring within the last 10 years: No If all of the above answers are "NO", then may proceed with Cephalosporin use.    DISCHARGE MEDICATIONS:   Allergies as of 04/25/2017      Reactions   Penicillins Other (See Comments)   Has patient had a PCN reaction causing immediate rash, facial/tongue/throat swelling, SOB or lightheadedness with hypotension: No Has patient had a PCN reaction causing severe rash involving mucus membranes or skin necrosis: No Has patient had a PCN reaction that required hospitalization: No Has patient had a PCN reaction occurring within the last 10 years: No If all of the above answers are "NO", then may proceed with Cephalosporin use.      Medication List    STOP taking these medications   acetaminophen 325 MG tablet Commonly known as:  TYLENOL   busPIRone 5 MG tablet Commonly known as:  BUSPAR   cefUROXime 500 MG tablet Commonly known as:  CEFTIN   ferrous sulfate 325 (65 FE) MG EC tablet   HYDROcodone-acetaminophen 5-325 MG tablet Commonly known as:  NORCO/VICODIN   insulin glargine 100 UNIT/ML injection Commonly known as:  LANTUS   insulin lispro 100 UNIT/ML injection Commonly known as:  HUMALOG   NAMENDA 10 MG tablet Generic drug:  memantine  QUEtiapine 25 MG tablet Commonly known as:  SEROQUEL   sennosides-docusate sodium 8.6-50 MG tablet Commonly known as:  SENOKOT-S   simvastatin 20 MG tablet Commonly known as:  ZOCOR   traZODone 50 MG tablet Commonly known as:  DESYREL     TAKE these medications   morphine CONCENTRATE 10 MG/0.5ML Soln concentrated solution Take 0.5 mLs (10 mg total) by mouth every 2 (two) hours as needed for severe pain, anxiety or shortness of breath.   ondansetron 4 MG tablet Commonly known as:  ZOFRAN Take 1 tablet (4 mg total) by mouth every 6 (six) hours as needed for nausea.        DISCHARGE INSTRUCTIONS:     Follow with Hospice for comfort care.  If you experience worsening of your admission symptoms, develop shortness of breath, life threatening emergency, suicidal or homicidal thoughts you must seek medical attention immediately by calling 911 or calling your MD immediately  if symptoms less severe.  You Must read complete instructions/literature along with all the possible adverse reactions/side effects for all the Medicines you take and that have been prescribed to you. Take any new Medicines after you have completely understood and accept all the possible adverse reactions/side effects.   Please note  You were cared for by a hospitalist during your hospital stay. If you have any questions about your discharge medications or the care you received while you were in the hospital after you are discharged, you can call the unit and asked to speak with the hospitalist on call if the hospitalist that took care of you is not available. Once you are discharged, your primary care physician will handle any further medical issues. Please note that NO REFILLS for any discharge medications will be authorized once you are discharged, as it is imperative that you return to your primary care physician (or establish a relationship with a primary care physician if you do not have one) for your aftercare needs so that they can reassess your need for medications and monitor your lab values.    Today   CHIEF COMPLAINT:   Chief Complaint  Patient presents with  . Rectal Bleeding    HISTORY OF PRESENT ILLNESS:  Colton Tyler  is a 84 y.o. male with a known history of bladder cancer, chronic kidney disease, coronary artery disease, dementia, diabetes mellitus, GERD, pancreatic cancer presented to the emergency room with bright red blood per rectum.  It was noticed in the may been [facility] assisted living facility.  Patient has been accompanied by his daughter in the emergency room.  According to the information from  the assisted living facility no vomiting of blood or coughing of blood.  Patient had endoscopy done last year according to the family.  No complaints of any abdominal pain.  No nausea and vomiting.  Patient was started on IV Protonix drip in the emergency room.   VITAL SIGNS:  Blood pressure 110/63, pulse (!) 103, temperature 98.5 F (36.9 C), temperature source Oral, resp. rate 18, height 6' (1.829 m), weight 52.2 kg (115 lb 1.3 oz), SpO2 100 %.  I/O:    Intake/Output Summary (Last 24 hours) at 04/25/2017 1308 Last data filed at 04/25/2017 1233 Gross per 24 hour  Intake 360 ml  Output 1050 ml  Net -690 ml    PHYSICAL EXAMINATION:   GENERAL:  84 y.o.-year-old patient lying in the bed with no acute distress.  EYES: Pupils equal, round, reactive to light and accommodation. No scleral icterus. Extraocular muscles  intact. Appears very pale. HEENT: Head atraumatic, normocephalic. Oropharynx and nasopharynx clear.  NECK:  Supple, no jugular venous distention. No thyroid enlargement, no tenderness.  LUNGS: Normal breath sounds bilaterally, no wheezing, rales,rhonchi or crepitation. No use of accessory muscles of respiration.  CARDIOVASCULAR: S1, S2 normal. No murmurs, rubs, or gallops.  ABDOMEN: Soft, nontender, nondistended. Bowel sounds present. No organomegaly or mass.  EXTREMITIES: No pedal edema, cyanosis, or clubbing.  NEUROLOGIC: Cranial nerves II through XII are intact. Muscle strength 2-3/5 in all extremities. Sensation intact. Gait not checked.  PSYCHIATRIC: The patient is alert and oriented x 1.  SKIN: No obvious rash, lesion, or ulcer.     DATA REVIEW:   CBC Recent Labs  Lab 04/24/17 0637  WBC 5.5  HGB 6.5*  HCT 20.3*  PLT 169    Chemistries  Recent Labs  Lab 04/24/17 0637  NA 140  K 2.9*  CL 111  CO2 21*  GLUCOSE 257*  BUN 28*  CREATININE 1.32*  CALCIUM 7.2*    Cardiac Enzymes No results for input(s): TROPONINI in the last 168 hours.  Microbiology  Results  Results for orders placed or performed during the hospital encounter of 04/23/17  MRSA PCR Screening     Status: None   Collection Time: 04/23/17 12:47 PM  Result Value Ref Range Status   MRSA by PCR NEGATIVE NEGATIVE Final    Comment:        The GeneXpert MRSA Assay (FDA approved for NASAL specimens only), is one component of a comprehensive MRSA colonization surveillance program. It is not intended to diagnose MRSA infection nor to guide or monitor treatment for MRSA infections. Performed at [facility], [address], [place], [place] [address]     RADIOLOGY:  No results found.  EKG:   Orders placed or performed during the hospital encounter of 03/13/17  . EKG 12-Lead  . EKG 12-Lead  . EKG 12-Lead  . EKG 12-Lead  . ED EKG  . ED EKG  . EKG 12-Lead  . EKG 12-Lead  . EKG      Management plans discussed with the patient, family and they are in agreement.  CODE STATUS:     Code Status Orders  (From admission, onward)        Start     Ordered   04/23/17 1119  Do not attempt resuscitation (DNR)  Continuous    Question Answer Comment  In the event of cardiac or respiratory ARREST Do not call a "code blue"   In the event of cardiac or respiratory ARREST Do not perform Intubation, CPR, defibrillation or ACLS   In the event of cardiac or respiratory ARREST Use medication by any route, position, wound care, and other measures to relive pain and suffering. May use oxygen, suction and manual treatment of airway obstruction as needed for comfort.      04/23/17 1118    Code Status History    Date Active Date Inactive Code Status Order ID Comments User Context   01/21/2017 21:26 01/23/2017 17:44 DNR [id number]  Fritzi Mandes, MD Inpatient   07/30/2016 17:41 08/01/2016 16:54 DNR [id number]  Hillary Bow, MD Inpatient   07/30/2016 17:35 07/30/2016 17:40 DNR [id number]  Idalia Needle, RN Inpatient    Advance Directive Documentation     Most Recent  Value  Type of Advance Directive  Healthcare Power of Attorney  Pre-existing out of facility DNR order (yellow form or pink MOST form)  Yellow form placed in chart (order  not valid for inpatient use)  "MOST" Form in Place?  No data      TOTAL TIME TAKING CARE OF THIS PATIENT: 35 minutes.    Vaughan Basta M.D on 04/25/2017 at 1:08 PM  Between 7am to 6pm - Pager - [phone]  After 6pm go to [email or web address] - password [email or web address] [facility]  Office  [phone]  CC: Primary care physician; Patient, No Pcp Per   Note: This dictation was prepared with Dragon dictation along with smaller phrase technology. Any transcriptional errors that result from this process are unintentional.

## 2017-04-28 IMAGING — CR DG CHEST 2V
2 series · 3 of 3 positions shown · non-contrast
Comparison: 08/02/2013.

CLINICAL DATA: Hyperglycemia and recent fall.

EXAM:
CHEST  2 VIEW

[Series 2: chest lat · 0.14mm/px · 2 of 2 slices shown]
[im 1/2]
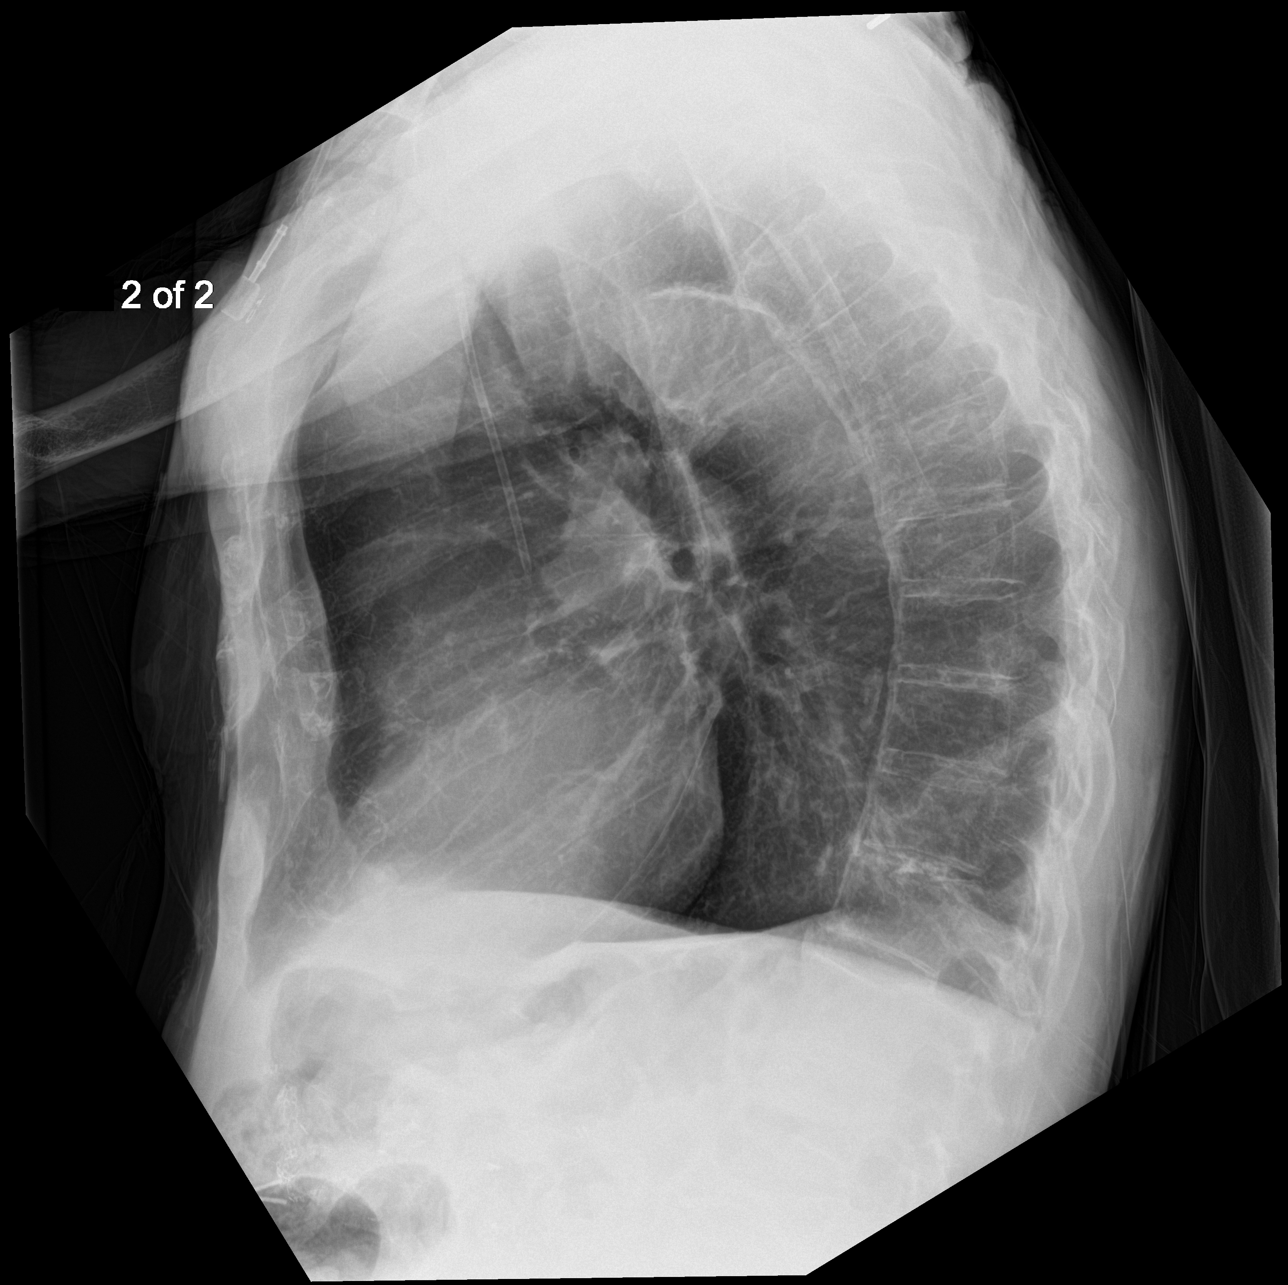
[im 2/2]
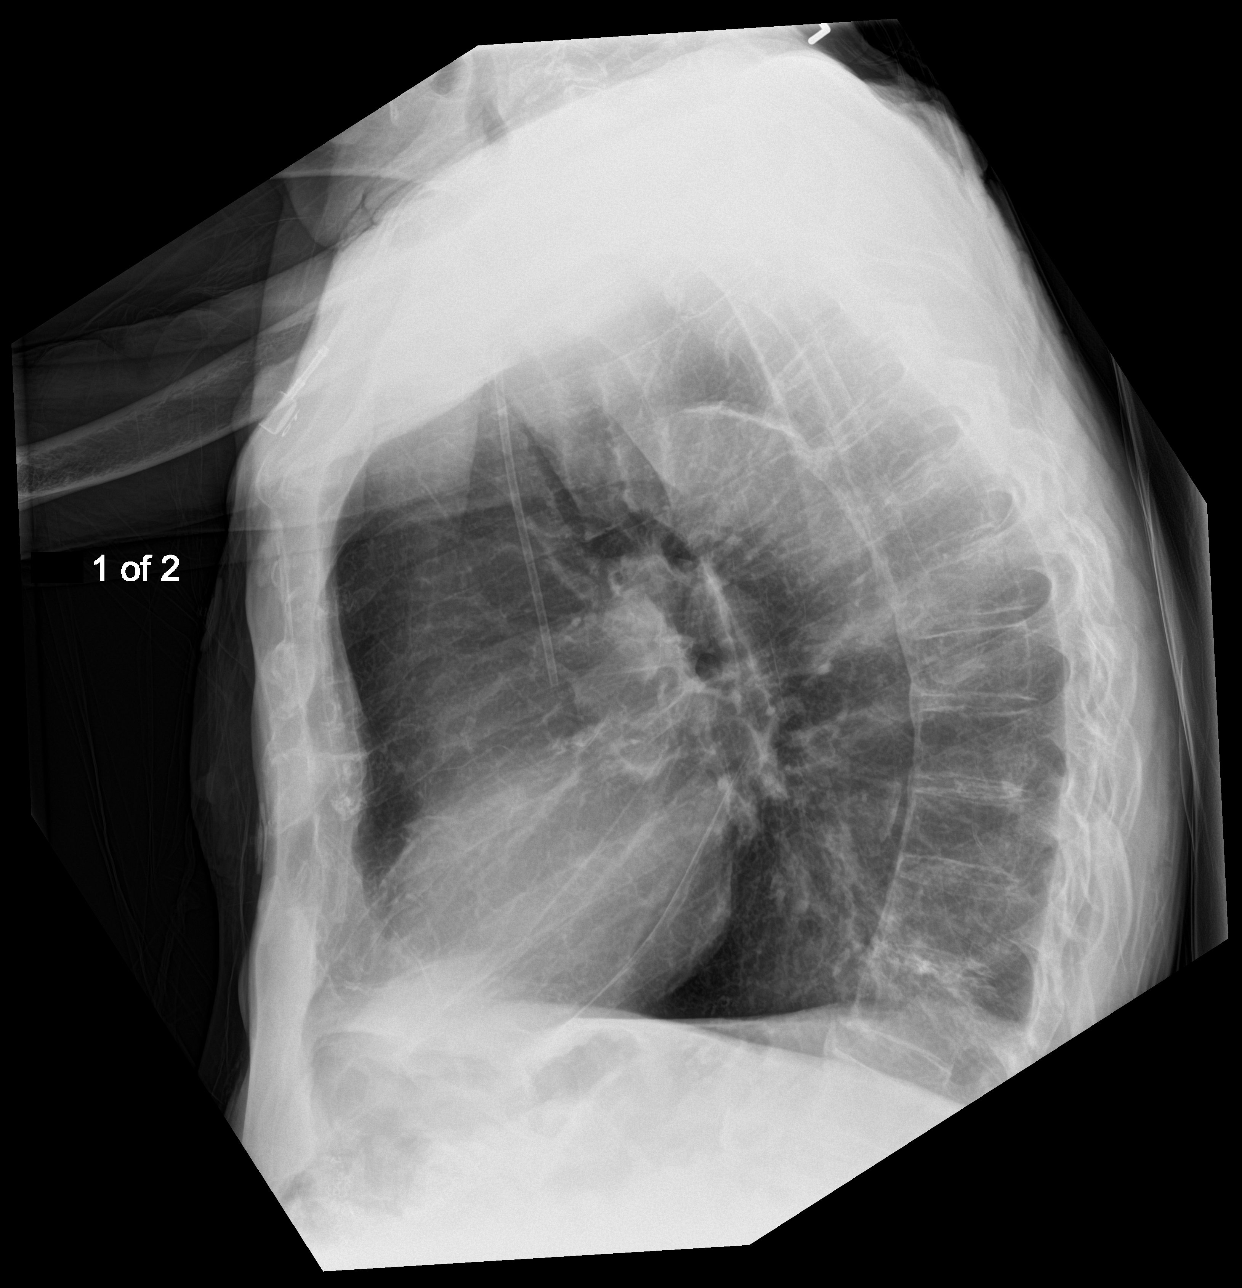

[chest ap]
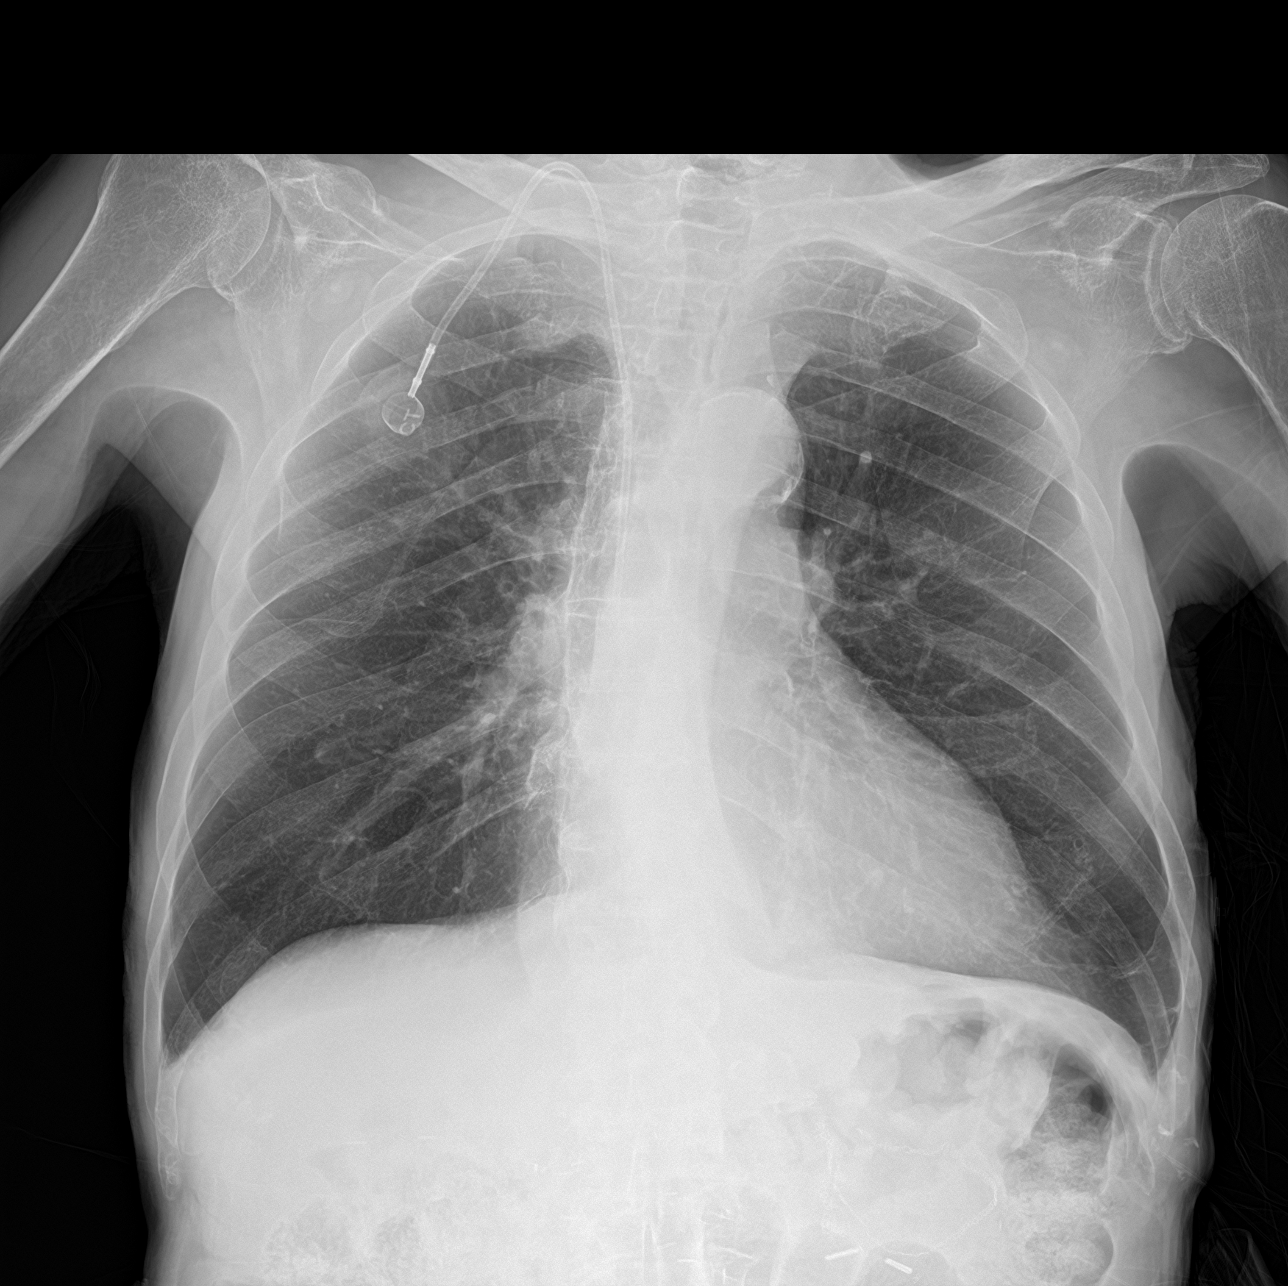

[3 of 3 positions shown; findings below may reference images not displayed]

FINDINGS: AP and lateral views of the chest show hyperexpansion without focal
airspace consolidation, pulmonary edema, or pleural effusion. Subtle
nodular density over the right lower lung may be a nipple shadow.
The cardiopericardial silhouette is within normal limits for size.
Right Port-A-Cath tip overlies the mid SVC. Bones are diffusely
demineralized.
IMPRESSION: 1. Hyperexpansion suggests emphysema.
2. **An incidental finding of potential clinical significance has
been found. 6-7 mm nodular density overlying the right lower lung.
This may be a nipple shadow, but repeat frontal radiograph with
nipple markers recommended to confirm.**

## 2017-05-07 DEATH — deceased

## 2017-05-11 ENCOUNTER — Other Ambulatory Visit: Payer: Medicare Other | Admitting: Urology

## 2018-06-16 IMAGING — CR DG KNEE COMPLETE 4+V*R*
4 series · 5 of 5 positions shown · non-contrast
Comparison: None.

CLINICAL DATA: Right knee pain secondary to a fall on 03/02/2017.

EXAM:
RIGHT KNEE - COMPLETE 4+ VIEW

[knee ap]
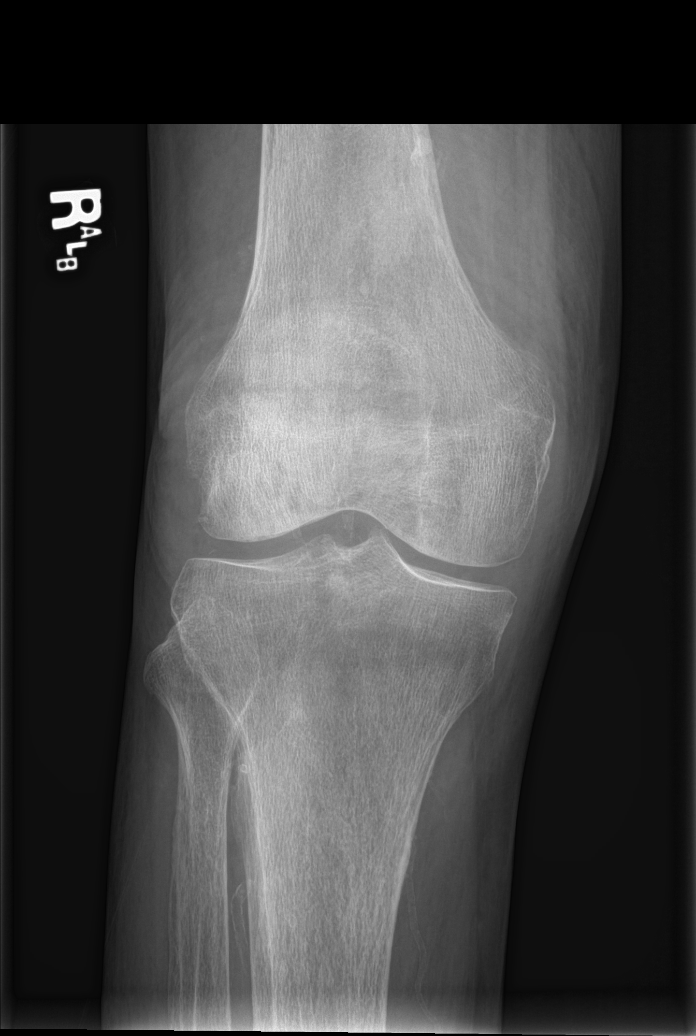

[knee obl (1 of 2)]
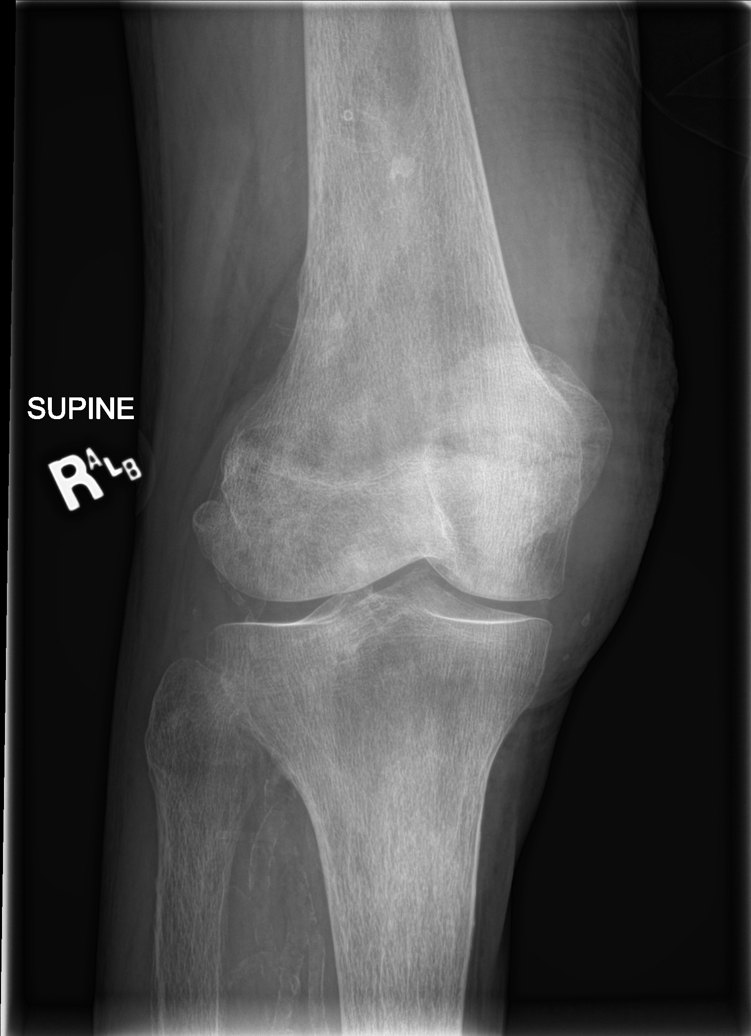

[knee obl (2 of 2)]
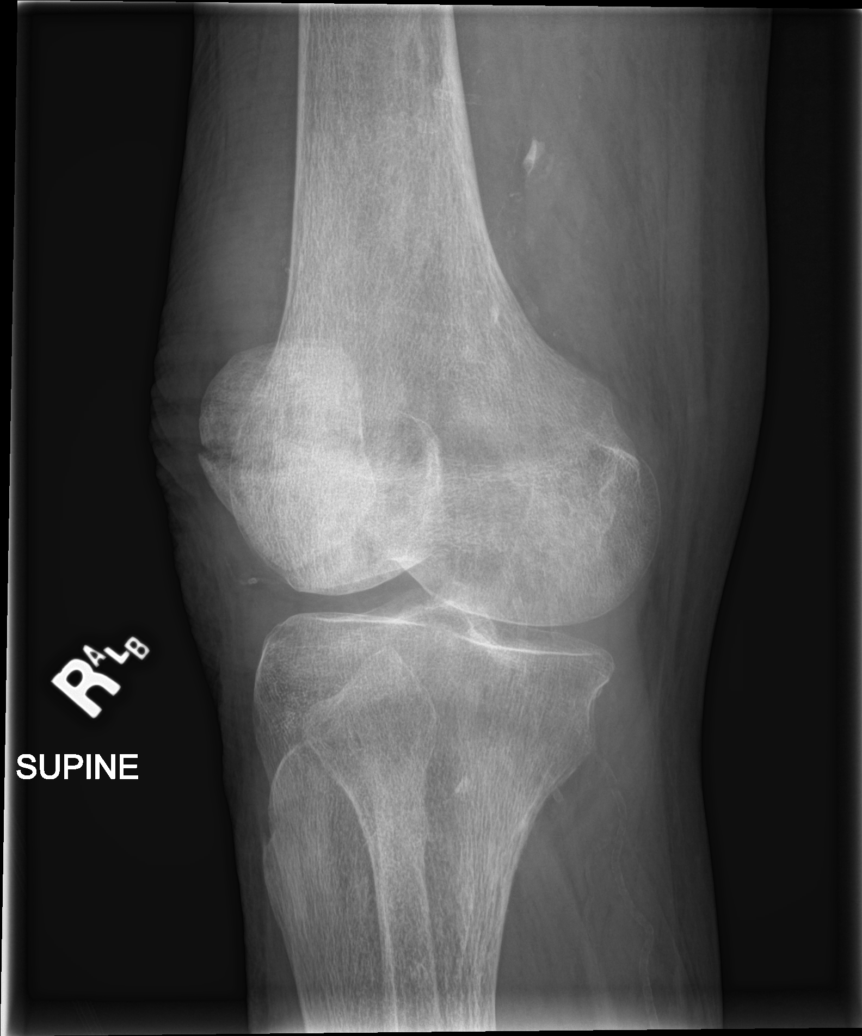

[Series 4: knee lat · 0.14mm/px · 2 of 2 slices shown]
[im 1/2]
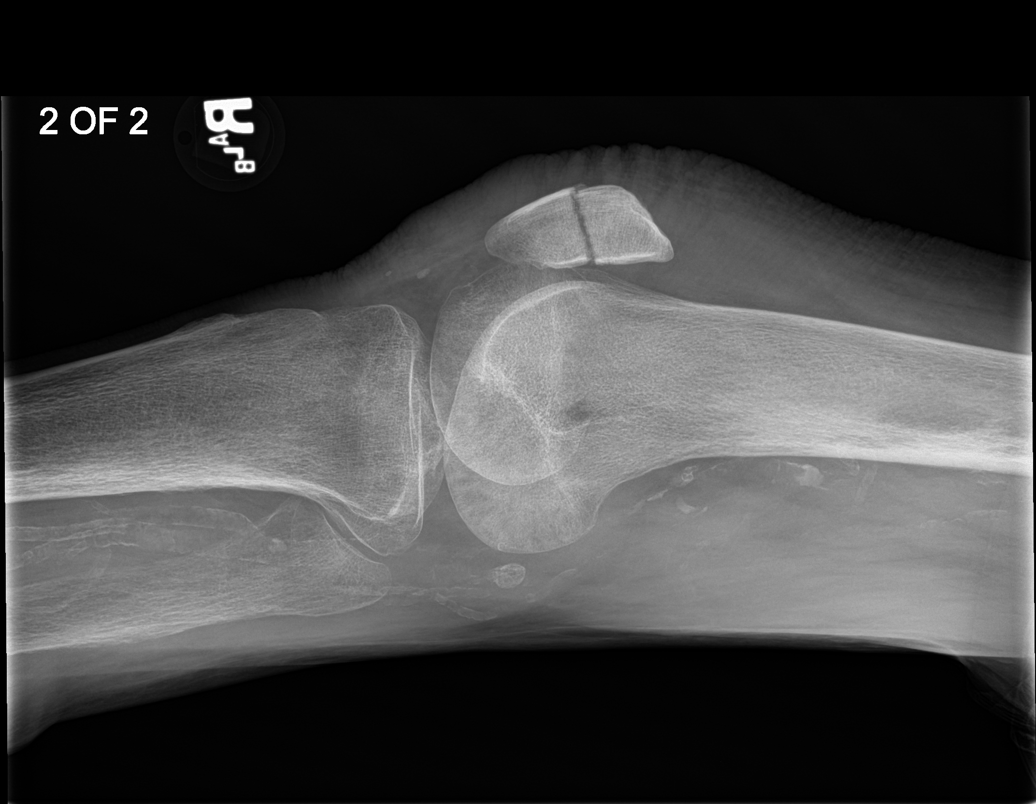
[im 2/2]
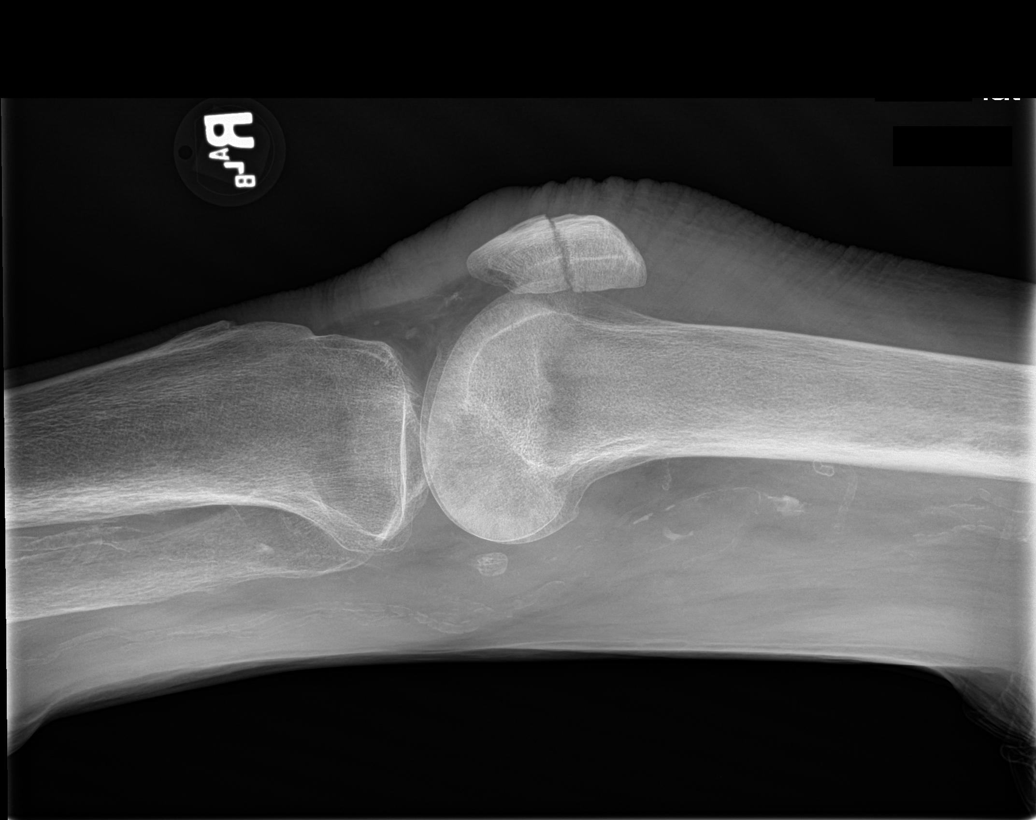

[5 of 5 positions shown; findings below may reference images not displayed]

FINDINGS: There is a transverse fracture through the mid patella with minimal
distraction. There is prominent overlying soft tissue swelling.
Joint effusion.

There is diffuse osteopenia.
IMPRESSION: Transverse fracture of the patella with minimal distraction.

Diffuse osteopenia.
